# Patient Record
Sex: Male | Born: 1950 | Race: White | Hispanic: No | Smoking: Former smoker
Health system: Southern US, Community
[De-identification: ages and names within clinical notes are randomized; demographics above are authoritative.]

## PROBLEM LIST (undated history)

## (undated) DIAGNOSIS — J449 Chronic obstructive pulmonary disease, unspecified: Secondary | ICD-10-CM

## (undated) DIAGNOSIS — N400 Enlarged prostate without lower urinary tract symptoms: Secondary | ICD-10-CM

## (undated) DIAGNOSIS — K08109 Complete loss of teeth, unspecified cause, unspecified class: Secondary | ICD-10-CM

## (undated) DIAGNOSIS — I10 Essential (primary) hypertension: Secondary | ICD-10-CM

## (undated) DIAGNOSIS — R053 Chronic cough: Secondary | ICD-10-CM

## (undated) DIAGNOSIS — I2699 Other pulmonary embolism without acute cor pulmonale: Secondary | ICD-10-CM

## (undated) DIAGNOSIS — J189 Pneumonia, unspecified organism: Secondary | ICD-10-CM

## (undated) DIAGNOSIS — N189 Chronic kidney disease, unspecified: Secondary | ICD-10-CM

## (undated) DIAGNOSIS — J8289 Other pulmonary eosinophilia, not elsewhere classified: Secondary | ICD-10-CM

## (undated) DIAGNOSIS — N401 Enlarged prostate with lower urinary tract symptoms: Secondary | ICD-10-CM

## (undated) DIAGNOSIS — Z973 Presence of spectacles and contact lenses: Secondary | ICD-10-CM

## (undated) DIAGNOSIS — R0609 Other forms of dyspnea: Secondary | ICD-10-CM

## (undated) DIAGNOSIS — J439 Emphysema, unspecified: Secondary | ICD-10-CM

## (undated) DIAGNOSIS — C679 Malignant neoplasm of bladder, unspecified: Secondary | ICD-10-CM

## (undated) DIAGNOSIS — R3912 Poor urinary stream: Secondary | ICD-10-CM

## (undated) DIAGNOSIS — B182 Chronic viral hepatitis C: Secondary | ICD-10-CM

## (undated) DIAGNOSIS — M1990 Unspecified osteoarthritis, unspecified site: Secondary | ICD-10-CM

## (undated) DIAGNOSIS — R0600 Dyspnea, unspecified: Secondary | ICD-10-CM

## (undated) DIAGNOSIS — Z8616 Personal history of COVID-19: Secondary | ICD-10-CM

## (undated) DIAGNOSIS — G4733 Obstructive sleep apnea (adult) (pediatric): Secondary | ICD-10-CM

## (undated) DIAGNOSIS — K9049 Malabsorption due to intolerance, not elsewhere classified: Secondary | ICD-10-CM

## (undated) DIAGNOSIS — J4489 Other specified chronic obstructive pulmonary disease: Secondary | ICD-10-CM

## (undated) DIAGNOSIS — D649 Anemia, unspecified: Secondary | ICD-10-CM

## (undated) DIAGNOSIS — Z86711 Personal history of pulmonary embolism: Secondary | ICD-10-CM

## (undated) HISTORY — DX: Other pulmonary embolism without acute cor pulmonale: I26.99

## (undated) HISTORY — PX: ANAL FISTULECTOMY: SHX1139

## (undated) HISTORY — DX: Chronic viral hepatitis C: B18.2

## (undated) HISTORY — DX: Malignant neoplasm of bladder, unspecified: C67.9

## (undated) HISTORY — PX: CYSTOSCOPY: SUR368

## (undated) HISTORY — PX: ABDOMINAL HERNIA REPAIR: SHX539

## (undated) HISTORY — DX: Benign prostatic hyperplasia without lower urinary tract symptoms: N40.0

---

## 2008-01-22 HISTORY — PX: INGUINAL HERNIA REPAIR: SUR1180

## 2009-01-21 HISTORY — PX: UMBILICAL HERNIA REPAIR: SHX196

## 2014-01-21 HISTORY — PX: SHOULDER ARTHROSCOPY W/ ROTATOR CUFF REPAIR: SHX2400

## 2014-01-21 HISTORY — PX: ORIF WRIST FRACTURE: SHX2133

## 2015-01-22 DIAGNOSIS — Z8615 Personal history of latent tuberculosis infection: Secondary | ICD-10-CM

## 2015-01-22 HISTORY — DX: Personal history of latent tuberculosis infection: Z86.15

## 2016-01-22 DIAGNOSIS — Z8619 Personal history of other infectious and parasitic diseases: Secondary | ICD-10-CM

## 2016-01-22 HISTORY — DX: Personal history of other infectious and parasitic diseases: Z86.19

## 2018-01-21 DIAGNOSIS — Z8551 Personal history of malignant neoplasm of bladder: Secondary | ICD-10-CM

## 2018-01-21 DIAGNOSIS — Z95828 Presence of other vascular implants and grafts: Secondary | ICD-10-CM

## 2018-01-21 HISTORY — PX: IVC FILTER INSERTION: CATH118245

## 2018-01-21 HISTORY — PX: TRANSURETHRAL RESECTION OF BLADDER TUMOR: SHX2575

## 2018-01-21 HISTORY — DX: Personal history of malignant neoplasm of bladder: Z85.51

## 2018-01-21 HISTORY — DX: Presence of other vascular implants and grafts: Z95.828

## 2019-01-22 DIAGNOSIS — Z8619 Personal history of other infectious and parasitic diseases: Secondary | ICD-10-CM

## 2019-01-22 HISTORY — PX: BRONCHOSCOPY: SUR163

## 2019-01-22 HISTORY — DX: Personal history of other infectious and parasitic diseases: Z86.19

## 2019-01-22 HISTORY — PX: TRANSURETHRAL RESECTION OF PROSTATE: SHX73

## 2020-11-29 ENCOUNTER — Emergency Department (HOSPITAL_COMMUNITY)
Admission: EM | Admit: 2020-11-29 | Discharge: 2020-11-29 | Discharge status: Against Medical Advice | Payer: Medicare Other | Source: Home / Self Care

## 2020-12-07 DIAGNOSIS — I10 Essential (primary) hypertension: Secondary | ICD-10-CM | POA: Diagnosis not present

## 2020-12-07 DIAGNOSIS — J418 Mixed simple and mucopurulent chronic bronchitis: Secondary | ICD-10-CM | POA: Diagnosis not present

## 2020-12-07 DIAGNOSIS — Z0000 Encounter for general adult medical examination without abnormal findings: Secondary | ICD-10-CM | POA: Diagnosis not present

## 2020-12-07 DIAGNOSIS — M25531 Pain in right wrist: Secondary | ICD-10-CM | POA: Diagnosis not present

## 2020-12-07 DIAGNOSIS — Z23 Encounter for immunization: Secondary | ICD-10-CM | POA: Diagnosis not present

## 2020-12-07 DIAGNOSIS — D692 Other nonthrombocytopenic purpura: Secondary | ICD-10-CM | POA: Diagnosis not present

## 2020-12-07 DIAGNOSIS — Z1389 Encounter for screening for other disorder: Secondary | ICD-10-CM | POA: Diagnosis not present

## 2020-12-07 DIAGNOSIS — Z8551 Personal history of malignant neoplasm of bladder: Secondary | ICD-10-CM | POA: Diagnosis not present

## 2020-12-07 DIAGNOSIS — E7800 Pure hypercholesterolemia, unspecified: Secondary | ICD-10-CM | POA: Diagnosis not present

## 2020-12-22 ENCOUNTER — Other Ambulatory Visit: Payer: Self-pay

## 2020-12-22 ENCOUNTER — Ambulatory Visit (INDEPENDENT_AMBULATORY_CARE_PROVIDER_SITE_OTHER): Payer: Medicare Other | Admitting: Internal Medicine

## 2020-12-22 ENCOUNTER — Encounter: Payer: Self-pay | Admitting: Internal Medicine

## 2020-12-22 VITALS — BP 130/74 | HR 50 | Temp 97.7°F | Ht 67.0 in | Wt 166.4 lb

## 2020-12-22 DIAGNOSIS — Z8615 Personal history of latent tuberculosis infection: Secondary | ICD-10-CM | POA: Diagnosis not present

## 2020-12-22 DIAGNOSIS — A310 Pulmonary mycobacterial infection: Secondary | ICD-10-CM | POA: Diagnosis not present

## 2020-12-22 DIAGNOSIS — Z87891 Personal history of nicotine dependence: Secondary | ICD-10-CM | POA: Diagnosis not present

## 2020-12-22 DIAGNOSIS — J449 Chronic obstructive pulmonary disease, unspecified: Secondary | ICD-10-CM | POA: Diagnosis not present

## 2020-12-22 DIAGNOSIS — Z8551 Personal history of malignant neoplasm of bladder: Secondary | ICD-10-CM

## 2020-12-22 MED ORDER — SPIRIVA HANDIHALER 18 MCG IN CAPS: 1.0000 | ORAL_CAPSULE | Freq: Every day | RESPIRATORY_TRACT | 5 refills | Status: DC

## 2020-12-22 MED ORDER — ALBUTEROL SULFATE HFA 108 (90 BASE) MCG/ACT IN AERS: 2.0000 | INHALATION_SPRAY | Freq: Four times a day (QID) | RESPIRATORY_TRACT | 5 refills | Status: DC | PRN

## 2020-12-22 MED ORDER — ADVAIR HFA 230-21 MCG/ACT IN AERO: 2.0000 | INHALATION_SPRAY | Freq: Two times a day (BID) | RESPIRATORY_TRACT | 12 refills | Status: DC

## 2020-12-22 NOTE — Progress Notes (Signed)
James Barker    [id number]    [birth year]  Primary Care Physician:No primary care provider on file.  Referring Physician: Lois Huxley, [place],  [place] [address] Reason for Consultation: asthma and copd Date of Consultation: 12/22/2020  Chief complaint:   Chief Complaint  Patient presents with   Consult    Pt states he has had asthma all his life and also states he has COPD. Pt states that he coughs and wheezes almost all the time. States when he wakes up in the morning, his symptoms seem to be the worse but states that when he does have an asthma attack,they are worse at night.     HPI: James Barker is a 70 y.o. man here for new patient evaluation for asthma and COPD. Recently moved here from [place] via [place]. Has seen a pulmonologist about a year ago in [place].    Dr. Glennon Mac - pulmonary doctor in [place] ([place] ) Dr. Wilford Grist in [facility]  He notes exposure to tuberculosis and was treated for latent TB in 2017 and treated for 9 months.  Had pneumonia twice and was treated for MAC while living in [place]. Has had a bronchoscopy and prolonged hospital stay.   Asthma with recurrent bronchitis in childhood. Was limited in adolescence in doing distance and endurance activities.  Had pulmonary embolism in 2015 with recurrence in 2019. He has an IVC filter because he has been unable tolerate anticoagulation for more than a couple of weeks and they were discontinued due to hematuria but this was at the same time he was treated with bladder cancer and. No GI bleed. Never had a stroke or ICH.  Mother had Blood clots but unclear if there is a genetic/familial history.    Current Regimen: Spiriva, symbicort, prn albuterol  Asthma Triggers: poor air quality Exacerbations in the last year: frequent exacerbations requiring prednisone. Last needed in Spring 2022.  History of hospitalization or intubation: not intubated   Allergy Testing: allergic to grasses, trees, molds GERD: denies Allergic Rhinitis: takes OTC antihistamine when his breathing is bad.  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  Exposures: lives with ex wife and dog.  Smoking history: passive smoke exposure in childhood. 20 pack year smoking history quit in 1990  Social History   Occupational History   Not on file  Tobacco Use   Smoking status: Former    Packs/day: 1.00    Years: 20.00    Pack years: 20.00    Types: Cigarettes    Quit date: 1992    Years since quitting: 30.9   Smokeless tobacco: Never  Substance and Sexual Activity   Alcohol use: Not on file   Drug use: Not on file   Sexual activity: Not on file    Relevant family history:  Family History  Problem Relation Age of Onset   Pulmonary embolism Mother    Emphysema Father    Lung cancer Sister    Asthma Child    Asthma Child     Past Medical History:  Diagnosis Date   Bladder cancer ([facility])    BPH (benign prostatic hyperplasia)    Pulmonary embolism ([facility])     Past Surgical  History:  Procedure Laterality Date   ABDOMINAL HERNIA REPAIR     CYSTOSCOPY      Physical Exam: Blood pressure 130/74, pulse (!) 50, temperature 97.7 F (36.5 C), temperature source Oral, height _0  (1.702 m), weight 166 lb 6.4 oz (75.5 kg), SpO2 98 %. Gen:      No acute distress ENT:  no nasal polyps, mucus membranes moist Lungs:    No increased respiratory effort, symmetric chest wall excursion, clear to auscultation bilaterally, no wheezes or crackles CV:         Regular rate and rhythm; no murmurs, rubs, or gallops.  No pedal edema Abd:      + bowel sounds; soft, non-tender; no distension MSK: no acute synovitis of DIP or PIP joints, no mechanics hands.  Skin:      Warm and  dry; no rashes Neuro: normal speech, no focal facial asymmetry Psych: alert and oriented x3, normal mood and affect   Data Reviewed/Medical Decision Making:  Independent interpretation of tests: Imaging:  PFTs:  No flowsheet data found.  Labs:   Immunization status:  Immunization History  Administered Date(s) Administered   Influenza, High Dose Seasonal PF 12/08/2020   Moderna Sars-Covid-2 Vaccination 03/25/2019, 04/22/2019, 09/30/2019   Pneumococcal-Unspecified 12/08/2020     I reviewed prior external note(s) from [facility] PCP  I reviewed the result(s) of the labs and imaging as noted above.   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 S/P IVC filter due to hematuria Family history of PE   Plan/Recommendations: Mr. Nix has an extensive past medical history as well as extensive pulmonary history. At this point his asthma is suboptimally controlled with frequent exacerbations requiring prednisone but we need more information. Will obtain PFTs. He is producing sputum so will send AFB and sputum cultures today.   We need to obtain records from his previous pulmonologists in [place] and [place].   He has brought in a paper saying symbicort is no longer covered so we'll switch to advair HFA to maintain his ICS dosing. Continue spiriva. Continue prn albuterol.  Continue spiriva.  He is up to date on his flu shot.   We discussed disease management and progression at length today.   I spent 45 minutes in the care of this patient today including pre-charting, chart review, review of results, face-to-face care, coordination of care and communication with consultants etc.).   Return to Care: Return in about 6 weeks (around 02/02/2021).  Lenice Llamas, MD Pulmonary and [place]  CC: Lois Huxley, [name]

## 2020-12-22 NOTE — Patient Instructions (Addendum)
Please schedule follow up scheduled with myself in 6-8 weeks.  If my schedule is not open yet, we will contact you with a reminder closer to that time. Please call [phone] if you haven't heard from [place] a month before.   Before your next visit I would like you to have: Full set of PFTs - 1 hour, follow up with me after. Sputum cultures x2  We are stopping your symbicort and switching you to advair 2 puffs twice a day, gargle after use. You can finish what you have left.   We will obtain records from your pulmonogist in [place] and [place] and have you sign a release today.   Instructions for getting a sputum sample:  Collect as soon as you wake up in the morning Do not eat or drink anything before collecting sample Only one sample per day per cup. Collect each sample in its own separate cups Only use cup care provider provided for you Do not use own plastic home containers Wash your hands before opening collection cup Do not take off lip until you are ready to place sample in cup  How to collect sputum sample 1. Loosen sputum Take 3 slow deep breath, so you can feel it from your stomach If you have a vest you may use this device to help break up sputum  2. Collect sample Wash your hands Take lid off Press rim of cup against lower lip  Take deep breath and hope for 2-3 seconds, then cough deeply by using stomach muscles to force out sputum. Make sure if comes from your stomach not your  not your throat or chest. GOAL: collect 3-105mL of sputum try to avoid saliva or spit  3. Refrigerate sample Close container  Place date on label provided for you - should be in bag Put label on cup Put cup in bag provided to your from office Place in refrigerator right away. DO NOT FREEZE IT.  Bring your sample to the [address] location, unless instructed otherwise.   Reminders Patient should have 2 cups if collecting more than one order Obtain sample in the  morning Return to our office within 4 hours of producing sample Produce enough sputum to reach the line marked by clinical staff  Make sure it is sputum and not spit  Any questions call the nearest office [place]- [phone] [place]- [phone]

## 2020-12-25 ENCOUNTER — Other Ambulatory Visit: Payer: Medicare Other

## 2020-12-25 DIAGNOSIS — J449 Chronic obstructive pulmonary disease, unspecified: Secondary | ICD-10-CM

## 2020-12-25 DIAGNOSIS — A310 Pulmonary mycobacterial infection: Secondary | ICD-10-CM

## 2020-12-26 ENCOUNTER — Other Ambulatory Visit: Payer: Medicare Other

## 2020-12-26 DIAGNOSIS — A310 Pulmonary mycobacterial infection: Secondary | ICD-10-CM | POA: Diagnosis not present

## 2020-12-26 DIAGNOSIS — J449 Chronic obstructive pulmonary disease, unspecified: Secondary | ICD-10-CM

## 2020-12-29 ENCOUNTER — Telehealth: Payer: Self-pay | Admitting: Internal Medicine

## 2020-12-29 DIAGNOSIS — J470 Bronchiectasis with acute lower respiratory infection: Secondary | ICD-10-CM

## 2020-12-29 LAB — RESPIRATORY CULTURE OR RESPIRATORY AND SPUTUM CULTURE
MICRO NUMBER:: 12714174
SPECIMEN QUALITY:: ADEQUATE

## 2020-12-29 NOTE — Telephone Encounter (Signed)
Received call from Dr. Henreitta Leber office stating that the pt had called them and was very confused as to why he was being referred out. Asked if we could call pt to let him know why Dr.Comer is going to see him. appt is on 12/12. routing as urgentr to help clear confusion for pt.

## 2020-12-29 NOTE — Telephone Encounter (Signed)
Called and spoke with pt about why the referral was placed for him to see Dr. Linus Salmons with ID and he verbalized understanding. Nothing further needed.

## 2021-01-01 NOTE — Telephone Encounter (Signed)
I have got the patient's ct moved to 12/13 at 7:30 he is aware

## 2021-01-01 NOTE — Telephone Encounter (Signed)
Noted.  Will close encounter.  

## 2021-01-01 NOTE — Telephone Encounter (Signed)
Noted.   Will route message to [facility] team. Ladies per ND patient needs CT before appt on 12/13.

## 2021-01-01 NOTE — Telephone Encounter (Signed)
Patient states needs CT scan to be stat. Dr. Linus Salmons going out of town. Scheduled to see Dr. Linus Salmons 01/02/2021. Needs CT scan before seeing Dr. Linus Salmons. Patient phone number is [phone].

## 2021-01-01 NOTE — Telephone Encounter (Signed)
Pt is now scheduled for tomorrow at 7:15 am  and he is aware

## 2021-01-01 NOTE — Telephone Encounter (Signed)
Patient now requesting sooner CT prior to visiting Dr. Linus Salmons on 12/13. Patient advised that the current order would need to be changed to STAT and pre authorized by insurance. Patient will contact Pulmonary team and request this so he can get a sooner appointment for CT. For now patient will see Dr. Linus Salmons as scheduled tomorrow. [name]

## 2021-01-02 ENCOUNTER — Ambulatory Visit (INDEPENDENT_AMBULATORY_CARE_PROVIDER_SITE_OTHER): Payer: Medicare Other | Admitting: Internal Medicine

## 2021-01-02 ENCOUNTER — Ambulatory Visit (HOSPITAL_BASED_OUTPATIENT_CLINIC_OR_DEPARTMENT_OTHER)
Admission: RE | Admit: 2021-01-02 | Discharge: 2021-01-02 | Disposition: A | Discharge status: Home / Self Care | Payer: Medicare Other | Source: Ambulatory Visit | Attending: Internal Medicine | Admitting: Internal Medicine

## 2021-01-02 ENCOUNTER — Other Ambulatory Visit: Payer: Self-pay

## 2021-01-02 ENCOUNTER — Encounter: Payer: Self-pay | Admitting: Internal Medicine

## 2021-01-02 DIAGNOSIS — J439 Emphysema, unspecified: Secondary | ICD-10-CM | POA: Diagnosis not present

## 2021-01-02 DIAGNOSIS — J449 Chronic obstructive pulmonary disease, unspecified: Secondary | ICD-10-CM

## 2021-01-02 DIAGNOSIS — M351 Other overlap syndromes: Secondary | ICD-10-CM | POA: Insufficient documentation

## 2021-01-02 DIAGNOSIS — J470 Bronchiectasis with acute lower respiratory infection: Secondary | ICD-10-CM | POA: Insufficient documentation

## 2021-01-02 DIAGNOSIS — I700 Atherosclerosis of aorta: Secondary | ICD-10-CM | POA: Diagnosis not present

## 2021-01-02 DIAGNOSIS — J479 Bronchiectasis, uncomplicated: Secondary | ICD-10-CM | POA: Diagnosis not present

## 2021-01-03 ENCOUNTER — Encounter: Payer: Self-pay | Admitting: Internal Medicine

## 2021-01-03 NOTE — Progress Notes (Signed)
[facility] for Infectious Disease      Reason for Consult: history of MAI positive culture    Referring Physician: Dr. Shearon Stalls    Patient ID: Drexel Iha, male    DOB: [birth year], 70 y.o.   MRN: [id number]  HPI:   He is here for evaluation as a new patient with a history of pneumonia.   He has a known history of asthma and COPD by PFTs and has been followed by pulmonary and infectious disease in the past.  No records available and all history from the patient.  About 2 years ago, he was hospitalized with a lung opacity and concern for tuberculosis.  He was on isolation and ruled out reportedly and treated with antibiotics and after a prolonged hospital stay, was released off of antibiotics and really did not have any follow up after that.  He had a positive culture for MAI at that time but no treatment ever offered or considered.  He now has moved to the area and saw Dr. Shearon Stalls of pulmonary and referred here with that history.  He had a CT scan of his chest in the am of the visit and this was reviewed with him.  No report was available at the time of the visit.  He is having no fever, no night sweats, no change in sputum or change in his breathing.  No hemoptysis. He has a history of a PE and an IVC filter.    Past Medical History:  Diagnosis Date   Bladder cancer ([facility])    BPH (benign prostatic hyperplasia)    Chronic hepatitis C ([facility])    treated with anti-virals   Pulmonary embolism ([facility])     Prior to Admission medications   Medication Sig Start Date End Date Taking? Authorizing Provider  albuterol (VENTOLIN HFA) 108 (90 Base) MCG/ACT inhaler Inhale 2 puffs into the lungs every 6 (six) hours as needed for wheezing or shortness of breath. 12/22/20  Yes Spero Geralds, MD  amLODipine (NORVASC) 5 MG tablet Take 5 mg by mouth 2 (two) times daily. 11/06/20  Yes [provider]  atorvastatin (LIPITOR) 20 MG tablet Take 20 mg by mouth daily. 12/07/20  Yes [provider]  fluticasone-salmeterol (ADVAIR HFA) 230-21 MCG/ACT inhaler Inhale 2 puffs into the lungs 2 (two) times daily. 12/22/20  Yes Spero Geralds, MD  losartan (COZAAR) 50 MG tablet Take 50 mg by mouth daily. 12/12/20  Yes [provider]  SPIRIVA HANDIHALER 18 MCG inhalation capsule Place 1 capsule (18 mcg total) into inhaler and inhale daily. 12/22/20  Yes Spero Geralds, MD    No Known Allergies  Social History   Tobacco Use   Smoking status: Former    Packs/day: 1.00    Years: 20.00    Pack years: 20.00    Types: Cigarettes    Quit date: 2    Years since quitting: 30.9   Smokeless tobacco: Never    Family History  Problem Relation Age of Onset   Pulmonary embolism Mother    Emphysema Father    Lung cancer Sister    Asthma Child    Asthma Child     Review of Systems  Constitutional: negative for fevers and chills Respiratory: negative for cough, sputum, or hemoptysis Gastrointestinal: negative for nausea and diarrhea All other systems reviewed and are negative    Constitutional: in no apparent distress  Vitals:   01/02/21 1356  BP: (!) 145/85  Pulse: Marland Kitchen)  48  Temp: 97.6 F (36.4 C)  SpO2: 99%   EYES: anicteric ENMT: no thrush Cardiovascular: Cor RRR Respiratory: CTA B; normal respiratory effort Musculoskeletal: no pedal edema noted Skin: negatives: no rash Neuro: non-focal  Labs: No results found for: WBC, HGB, HCT, MCV, PLT No results found for: CREATININE, BUN, NA, K, CL, CO2 No results found for: ALT, AST, GGT, ALKPHOS, BILITOT, INR   Assessment: he has a history of a positive culture with MAI but never treated.  I reviewed the CT scan with him and there are no concerning findings to warrant any further evaluation from an ID standpoint.  No cavitary lesions, no bronchiectasis noted.  I also reviewed with the sputum culture with Acinetobacter and with no current symptoms of pneumonia or infection, no indication for treatment.     Plan: 1)  agree with PFTs, which are already ordered 2) continue follow up with pulmonary Follow up here as needed

## 2021-01-04 DIAGNOSIS — M1811 Unilateral primary osteoarthritis of first carpometacarpal joint, right hand: Secondary | ICD-10-CM | POA: Diagnosis not present

## 2021-01-05 ENCOUNTER — Ambulatory Visit (HOSPITAL_COMMUNITY): Payer: Medicare Other

## 2021-01-31 DIAGNOSIS — Z8551 Personal history of malignant neoplasm of bladder: Secondary | ICD-10-CM | POA: Diagnosis not present

## 2021-01-31 DIAGNOSIS — R3912 Poor urinary stream: Secondary | ICD-10-CM | POA: Diagnosis not present

## 2021-02-07 DIAGNOSIS — M1811 Unilateral primary osteoarthritis of first carpometacarpal joint, right hand: Secondary | ICD-10-CM | POA: Diagnosis not present

## 2021-02-11 LAB — AFB CULTURE WITH SMEAR (NOT AT ARMC)
Acid Fast Culture: NEGATIVE
Acid Fast Smear: NEGATIVE

## 2021-02-20 ENCOUNTER — Ambulatory Visit (INDEPENDENT_AMBULATORY_CARE_PROVIDER_SITE_OTHER): Payer: Medicare Other | Admitting: Internal Medicine

## 2021-02-20 ENCOUNTER — Other Ambulatory Visit: Payer: Self-pay

## 2021-02-20 ENCOUNTER — Encounter: Payer: Self-pay | Admitting: Internal Medicine

## 2021-02-20 VITALS — BP 128/74 | HR 48 | Temp 97.8°F | Ht 66.0 in | Wt 168.0 lb

## 2021-02-20 DIAGNOSIS — J449 Chronic obstructive pulmonary disease, unspecified: Secondary | ICD-10-CM

## 2021-02-20 LAB — PULMONARY FUNCTION TEST
DL/VA % pred: 67 %
DL/VA: 2.78 ml/min/mmHg/L
DLCO cor % pred: 64 %
DLCO cor: 14.74 ml/min/mmHg
DLCO unc % pred: 64 %
DLCO unc: 14.74 ml/min/mmHg
FEF 25-75 Post: 0.83 L/sec
FEF 25-75 Pre: 0.73 L/sec
FEF2575-%Change-Post: 14 %
FEF2575-%Pred-Post: 39 %
FEF2575-%Pred-Pre: 34 %
FEV1-%Change-Post: 8 %
FEV1-%Pred-Post: 61 %
FEV1-%Pred-Pre: 56 %
FEV1-Post: 1.7 L
FEV1-Pre: 1.57 L
FEV1FVC-%Change-Post: 4 %
FEV1FVC-%Pred-Pre: 66 %
FEV6-%Change-Post: 2 %
FEV6-%Pred-Post: 89 %
FEV6-%Pred-Pre: 86 %
FEV6-Post: 3.16 L
FEV6-Pre: 3.08 L
FEV6FVC-%Change-Post: 0 %
FEV6FVC-%Pred-Post: 102 %
FEV6FVC-%Pred-Pre: 102 %
FVC-%Change-Post: 3 %
FVC-%Pred-Post: 87 %
FVC-%Pred-Pre: 84 %
FVC-Post: 3.3 L
FVC-Pre: 3.2 L
Post FEV1/FVC ratio: 51 %
Post FEV6/FVC ratio: 96 %
Pre FEV1/FVC ratio: 49 %
Pre FEV6/FVC Ratio: 96 %
RV % pred: 131 %
RV: 2.94 L
TLC % pred: 105 %
TLC: 6.54 L

## 2021-02-20 MED ORDER — PREDNISONE 10 MG PO TABS: ORAL_TABLET | ORAL | 0 refills | Status: AC

## 2021-02-20 MED ORDER — MISC. DEVICES MISC: 0 refills | Status: DC

## 2021-02-20 NOTE — Progress Notes (Signed)
James Barker    [id number]    01-02-1951  Primary Care Physician:[name], [name], PA Date of Appointment: 02/20/2021 Established Patient Visit  Chief complaint:   Chief Complaint  Patient presents with   Follow-up    Cough and wheezing all the time      HPI: James Barker is a 71 y.o. man with history of asthma COPD overlap syndrome.  Interval Updates: Here for follow up after CT scan and PFTs.   He has been taking albuterol more frequently 2-3 times/day. With nebulizer treatments.   He has been having cough with mucus production. Worst in the morning.   No fevers, chills, night sweats, weight loss. Appetite is ok  Still on spiriva and advair almost everyday.   I have reviewed the patient's family social and past medical history and updated as appropriate.   Past Medical History:  Diagnosis Date   Bladder cancer ( [facility])    BPH (benign prostatic hyperplasia)    Chronic hepatitis C ([facility])    treated with anti-virals   Pulmonary embolism ([facility])     Past Surgical History:  Procedure Laterality Date   ABDOMINAL HERNIA REPAIR     CYSTOSCOPY      Family History  Problem Relation Age of Onset   Pulmonary embolism Mother    Emphysema Father    Lung cancer Sister    Asthma Child    Asthma Child     Social History   Occupational History   Not on file  Tobacco Use   Smoking status: Former    Packs/day: 1.00    Years: 20.00    Pack years: 20.00    Types: Cigarettes    Quit date: 1992    Years since quitting: 31.1   Smokeless tobacco: Never  Substance and Sexual Activity   Alcohol use: Not on file   Drug use: Not on file   Sexual activity: Not on file     Physical Exam: Blood pressure 128/74, pulse (!) 48, temperature 97.8 F (36.6 C), temperature source Oral, height 5\' 6"  (1.676 m), weight 168 lb (76.2 kg), SpO2 99 %.  Gen:      No acute distress ENT:  no nasal polyps, mucus membranes moist Lungs:    No increased respiratory effort,  symmetric chest wall excursion, clear to auscultation bilaterally, no wheezes or crackles CV:         Regular rate and rhythm; no murmurs, rubs, or gallops.  No pedal edema   Data Reviewed: Imaging: I have personally reviewed the CT Chest which shows mild upper lobe predominant emphysema. Mild airway thickening.   PFTs:  PFT Results Latest Ref Rng & Units 02/20/2021  FVC-Pre L 3.20  FVC-Predicted Pre % 84  FVC-Post L 3.30  FVC-Predicted Post % 87  Pre FEV1/FVC % % 49  Post FEV1/FCV % % 51  FEV1-Pre L 1.57  FEV1-Predicted Pre % 56  FEV1-Post L 1.70  DLCO uncorrected ml/min/mmHg 14.74  DLCO UNC% % 64  DLCO corrected ml/min/mmHg 14.74  DLCO COR %Predicted % 64  DLVA Predicted % 67  TLC L 6.54  TLC % Predicted % 105  RV % Predicted % 131   I have personally reviewed the patient's PFTs and moderately severe airflow limitation. Reduced diffusion capacity  Labs:  Immunization status: Immunization History  Administered Date(s) Administered   Influenza, High Dose Seasonal PF 12/08/2020   Moderna Sars-Covid-2 Vaccination 03/25/2019, 04/22/2019, 09/30/2019   Pneumococcal-Unspecified 12/08/2020  External Records Personally Reviewed: extensive records reviewed from hospital stay in [place], [place]  Assessment:  Asthma COPD Overlap Syndrome with acute exacerbation Radiographic emphysema Acinetobacter colonization  Plan/Recommendations: Prednisone taper today for flare.  Will obtain IgE, Region 2 immunocap testing, cbc with diff. May be eligible for biologics.  Continue advair and spiriva Will prescribe flutter valve   Return to Care: Return in about 3 months (around 05/20/2021).   Lenice Llamas, MD Pulmonary and [place]

## 2021-02-20 NOTE — Patient Instructions (Addendum)
Please schedule follow up scheduled with myself in 3 months.  If my schedule is not open yet, we will contact you with a reminder closer to that time. Please call [phone] if you haven't heard from [place] a month before.   I have sent a prednisone taper and a flutter valve to your pharmacy.   Continue advair and spiriva.   Please get your blood work done

## 2021-02-20 NOTE — Progress Notes (Signed)
PFT done today. 

## 2021-02-22 DIAGNOSIS — R160 Hepatomegaly, not elsewhere classified: Secondary | ICD-10-CM | POA: Diagnosis not present

## 2021-02-22 DIAGNOSIS — K5900 Constipation, unspecified: Secondary | ICD-10-CM | POA: Diagnosis not present

## 2021-02-22 DIAGNOSIS — Z8551 Personal history of malignant neoplasm of bladder: Secondary | ICD-10-CM | POA: Diagnosis not present

## 2021-02-22 DIAGNOSIS — N281 Cyst of kidney, acquired: Secondary | ICD-10-CM | POA: Diagnosis not present

## 2021-02-26 ENCOUNTER — Telehealth: Payer: Self-pay | Admitting: Internal Medicine

## 2021-02-26 DIAGNOSIS — J449 Chronic obstructive pulmonary disease, unspecified: Secondary | ICD-10-CM

## 2021-02-26 NOTE — Telephone Encounter (Signed)
Spoke with the pt  He states that he was unable to pick up rx for flutter valve from [facility]

## 2021-02-26 NOTE — Telephone Encounter (Signed)
[facility]ed [facility]  They do not have flutter valves  Offered pt to come to office and pick one up since they are back in stock  Pt will pick up and it has been placed up front with form to be signed

## 2021-04-02 DIAGNOSIS — Z8551 Personal history of malignant neoplasm of bladder: Secondary | ICD-10-CM | POA: Diagnosis not present

## 2021-04-04 ENCOUNTER — Telehealth: Payer: Self-pay | Admitting: Internal Medicine

## 2021-04-04 NOTE — Telephone Encounter (Signed)
Called patient and he states he needs a refill of the Iputropium- Albuterol solution for her neb machine. I looked and I didn't see this medication on his list but patient states he has been on this medication for years. ? ?Dr Shearon Stalls do you want to order this medication for this patient ? ?Please advise  ?

## 2021-04-04 NOTE — Telephone Encounter (Signed)
OK to order duoneb every 6 hours as needed for shortness of breath, but please let him know he can not take spiriva inhaler with duoneb or he will end up taking too much of the same medicine and this can cause heart problems. If he wants to take duoneb he should stop spiriva.  ? ?Also please remind him to get the blood work done that I ordered at the last visit because this will help qualify him for other therapies for his asthma that can dramatically improve symptoms.  ? ?

## 2021-04-05 MED ORDER — IPRATROPIUM-ALBUTEROL 0.5-2.5 (3) MG/3ML IN SOLN: 3.0000 mL | Freq: Four times a day (QID) | RESPIRATORY_TRACT | 6 refills | Status: DC | PRN

## 2021-04-05 NOTE — Telephone Encounter (Signed)
Spoke to patient and relayed below message. He voiced his understanding.  ?He would like to proceed with Duoneb. Rx sent to preferred pharmacy. ?He is aware to get labs and stop spiriva.  ?Nothing further needed.  ? ?

## 2021-04-09 ENCOUNTER — Other Ambulatory Visit (INDEPENDENT_AMBULATORY_CARE_PROVIDER_SITE_OTHER): Payer: Medicare Other

## 2021-04-09 DIAGNOSIS — J449 Chronic obstructive pulmonary disease, unspecified: Secondary | ICD-10-CM | POA: Diagnosis not present

## 2021-04-09 LAB — CBC WITH DIFFERENTIAL/PLATELET
Basophils Absolute: 0 10*3/uL (ref 0.0–0.1)
Basophils Relative: 0.6 % (ref 0.0–3.0)
Eosinophils Absolute: 0.3 10*3/uL (ref 0.0–0.7)
Eosinophils Relative: 4.2 % (ref 0.0–5.0)
HCT: 39.4 % (ref 39.0–52.0)
Hemoglobin: 13.3 g/dL (ref 13.0–17.0)
Lymphocytes Relative: 24.9 % (ref 12.0–46.0)
Lymphs Abs: 1.7 10*3/uL (ref 0.7–4.0)
MCHC: 33.7 g/dL (ref 30.0–36.0)
MCV: 87.6 fl (ref 78.0–100.0)
Monocytes Absolute: 0.7 10*3/uL (ref 0.1–1.0)
Monocytes Relative: 10.7 % (ref 3.0–12.0)
Neutro Abs: 4 10*3/uL (ref 1.4–7.7)
Neutrophils Relative %: 59.6 % (ref 43.0–77.0)
Platelets: 178 10*3/uL (ref 150.0–400.0)
RBC: 4.5 Mil/uL (ref 4.22–5.81)
RDW: 13.8 % (ref 11.5–15.5)
WBC: 6.7 10*3/uL (ref 4.0–10.5)

## 2021-04-11 LAB — RESPIRATORY ALLERGY PROFILE REGION II ~~LOC~~
Allergen, A. alternata, m6: <0.1 kU/L
Allergen, Cedar tree, t12: <0.1 kU/L
Allergen, Comm Silver Birch, t9: <0.1 kU/L
Allergen, Cottonwood, t14: <0.1 kU/L
Allergen, D pternoyssinus,d7: <0.1 kU/L
Allergen, Mouse Urine Protein, e78: <0.1 kU/L
Allergen, Mulberry, t76: <0.1 kU/L
Allergen, Oak,t7: <0.1 kU/L
Allergen, P. notatum, m1: <0.1 kU/L
Aspergillus fumigatus, m3: 1.61 kU/L — ABNORMAL HIGH
Bermuda Grass: <0.1 kU/L
Box Elder IgE: <0.1 kU/L
CLADOSPORIUM HERBARUM (M2) IGE: <0.1 kU/L
COMMON RAGWEED (SHORT) (W1) IGE: <0.1 kU/L
Cat Dander: <0.1 kU/L
Class: 0
Class: 0
Class: 0
Class: 0
Class: 0
Class: 0
Class: 0
Class: 0
Class: 0
Class: 0
Class: 0
Class: 0
Class: 0
Class: 0
Class: 0
Class: 0
Class: 0
Class: 0
Class: 0
Class: 0
Class: 0
Class: 0
Class: 0
Class: 2
Cockroach: <0.1 kU/L
D. farinae: <0.1 kU/L
Dog Dander: <0.1 kU/L
Elm IgE: <0.1 kU/L
IgE (Immunoglobulin E), Serum: 58 kU/L (ref <=114)
Johnson Grass: <0.1 kU/L
Pecan/Hickory Tree IgE: <0.1 kU/L
Rough Pigweed  IgE: <0.1 kU/L
Sheep Sorrel IgE: <0.1 kU/L
Timothy Grass: <0.1 kU/L

## 2021-04-11 LAB — INTERPRETATION:

## 2021-04-13 ENCOUNTER — Other Ambulatory Visit (HOSPITAL_COMMUNITY): Payer: Self-pay

## 2021-04-13 ENCOUNTER — Ambulatory Visit (INDEPENDENT_AMBULATORY_CARE_PROVIDER_SITE_OTHER): Payer: Medicare Other | Admitting: Nurse Practitioner

## 2021-04-13 ENCOUNTER — Telehealth: Payer: Self-pay | Admitting: Pharmacist

## 2021-04-13 ENCOUNTER — Other Ambulatory Visit: Payer: Self-pay

## 2021-04-13 ENCOUNTER — Encounter: Payer: Self-pay | Admitting: Nurse Practitioner

## 2021-04-13 VITALS — BP 140/90 | HR 59 | Temp 98.1°F | Ht 67.0 in | Wt 168.2 lb

## 2021-04-13 DIAGNOSIS — D721 Eosinophilia, unspecified: Secondary | ICD-10-CM

## 2021-04-13 DIAGNOSIS — J441 Chronic obstructive pulmonary disease with (acute) exacerbation: Secondary | ICD-10-CM | POA: Diagnosis not present

## 2021-04-13 DIAGNOSIS — B370 Candidal stomatitis: Secondary | ICD-10-CM

## 2021-04-13 DIAGNOSIS — J449 Chronic obstructive pulmonary disease, unspecified: Secondary | ICD-10-CM

## 2021-04-13 DIAGNOSIS — J45901 Unspecified asthma with (acute) exacerbation: Secondary | ICD-10-CM

## 2021-04-13 DIAGNOSIS — I10 Essential (primary) hypertension: Secondary | ICD-10-CM | POA: Diagnosis not present

## 2021-04-13 LAB — POCT EXHALED NITRIC OXIDE: FeNO level (ppb): 53

## 2021-04-13 MED ORDER — NYSTATIN 100000 UNIT/ML MT SUSP: 5.0000 mL | Freq: Four times a day (QID) | OROMUCOSAL | 0 refills | Status: AC

## 2021-04-13 MED ORDER — DUPIXENT 300 MG/2ML ~~LOC~~ SOAJ
SUBCUTANEOUS | 0 refills | Status: DC
  Filled 2021-04-13: qty 8, fill #0
  Filled 2021-04-13: qty 8, 28d supply, fill #0

## 2021-04-13 MED ORDER — ALBUTEROL SULFATE (2.5 MG/3ML) 0.083% IN NEBU: 2.5000 mg | INHALATION_SOLUTION | Freq: Four times a day (QID) | RESPIRATORY_TRACT | 12 refills | Status: DC | PRN

## 2021-04-13 MED ORDER — PREDNISONE 10 MG PO TABS: ORAL_TABLET | ORAL | 0 refills | Status: DC

## 2021-04-13 MED ORDER — GUAIFENESIN ER 600 MG PO TB12: 600.0000 mg | ORAL_TABLET | Freq: Two times a day (BID) | ORAL | 1 refills | Status: DC

## 2021-04-13 MED ORDER — SPIRIVA RESPIMAT 2.5 MCG/ACT IN AERS: 2.0000 | INHALATION_SPRAY | Freq: Every day | RESPIRATORY_TRACT | 5 refills | Status: DC

## 2021-04-13 NOTE — Patient Instructions (Addendum)
Continue Advair 2 puffs Twice daily. Brush tongue and rinse mouth afterwards ?Continue Albuterol inhaler 2 puffs every 6 hours as needed for shortness of breath or wheezing. Notify if symptoms persist despite rescue inhaler/neb use. ?Stop Duonebs and change to albuterol nebs 3 mL every 6 hours as needed for shortness of breath or wheezing ?Restart Spiriva 1 puff daily  ? ?-Prednisone taper. 4 tabs for 2 days, then 3 tabs for 2 days, 2 tabs for 2 days, then 1 tab for 2 days, then stop. Take in AM with food.  ?-Mucinex 600 mg Twice daily  ?-Nystatin 5 mL four times a day for 10 days. Swish and swallow  ? ?We will start the process for Dupixent. The pharmacy team will be in contact with you to discuss next steps.  ? ?Follow up with Dr. Shearon Stalls in 3 months. If symptoms do not improve or worsen, please contact office for sooner follow up or seek emergency care. ?

## 2021-04-13 NOTE — Telephone Encounter (Signed)
Delivery instructions have been updated in [facility], medication will be couriered to [facility] prior to 04/19/21. ? ?Rx has been processed in [facility] and the patient has no copay at this time. ? ?

## 2021-04-13 NOTE — Assessment & Plan Note (Signed)
Elevated on recent check to 0.3. Given this and symptom burden, will start process for biologic therapy with Dupixent.  ?

## 2021-04-13 NOTE — Telephone Encounter (Signed)
Submitted a Prior Authorization request to [facility] for [facility] via CoverMyMeds. Will update once we receive a response. ? ?Key: [id number] ? ?Dupixent [employer] paperwork completed at [facility] today per LPN message. ? ?Knox Saliva, PharmD, MPH, BCPS ?Clinical Pharmacist (Rheumatology and Pulmonology) ? ?

## 2021-04-13 NOTE — Progress Notes (Signed)
? ?'@Patient'$  ID: James Barker, male    DOB: 11/19/1950, 71 y.o.   MRN: [id number] ? ?Chief Complaint  ?Patient presents with  ? Follow-up  ?  Nebulizer medication question  ? ? ?Referring provider: ?Lois Huxley, PA ? ?HPI: ?71 year old male, former smoker (20 pack years) followed for asthma/COPD overlap. He is a patient of Dr. Mauricio Po and last seen in office on 02/20/2021. 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 ? ?TEST/EVENTS:  ?12/26/2020: AFB neg; sputum culture grew acinetobacter  ?01/02/2021 CT chest wo contrast: atherosclerosis. Azygous fissure and small Bochdalek's hernias containing only fat were noted. Mild diffuse central airway thickening. Linear scarring or atelectasis in RUL. Mild biapical scarring and mild centrilobular emphysema.  ?02/20/2021 PFTs: FVC 2.2 (87), FEV1 1.7 (61), ratio 51, TLC 105%, DLCOcor 64%. No BD; did have some midflow reversibility. Moderate obstructive airway disease with diffusion defect  ?04/09/2021: RAST positive high aspergillus, IgE 58, Eos 0.3  ? ?02/20/2021: OV with Dr. Shearon Stalls for AECOPD/asthma. Previously seen by ID for hx of MAI infection, never treated. CT chest without concerning findings. Sputum culture with acinetobacter - suspected to be colonization as no symptoms of pna or infection. Advised follow up with ID as needed. Prednisone taper prescribed. Allergen panel, CBC with diff ordered. Maintained on Advair and Spiriva. May be biologic candidate. Added flutter valve on ? ?04/13/2021: Today - follow up ?Patient presents today for follow up. He feels like yesterday, he had a terrible day and couldn't even walk up 4 steps without getting short of breath and wheezing. Today, he feels a little bit better. Still has some increased SOB and wheezing but nothing compared to yesterday. He has a chronic, minimally productive cough which is unchanged from his baseline. He has not  had any recent sick exposures and denies fevers, chills or body aches. He is a little confused about duonebs versus Spiriva; he also thought he was supposed to stop his Advair as well. Since he called into the office, he has tried to not use his nebs as much due to the confusion. He is tired of not being able to breathe well and wants to do more for his symptoms.  ? ?FeNO 53 ppb ? ?No Known Allergies ? ?Immunization History  ?Administered Date(s) Administered  ? Influenza, High Dose Seasonal PF 12/08/2020  ? Moderna Sars-Covid-2 Vaccination 03/25/2019, 04/22/2019, 09/30/2019  ? Pneumococcal-Unspecified 12/08/2020  ? ? ?Past Medical History:  ?Diagnosis Date  ? Bladder cancer ([place])   ? BPH (benign prostatic hyperplasia)   ? Chronic hepatitis C ([place])   ? treated with anti-virals  ? Pulmonary embolism ([place])   ? ? ?Tobacco History: ?Social History  ? ?Tobacco Use  ?Smoking Status Former  ? Packs/day: 1.00  ? Years: 20.00  ? Pack years: 20.00  ? Types: Cigarettes  ? Quit date: 33  ? Years since quitting: 31.2  ?Smokeless Tobacco Never  ? ?Counseling given: Not Answered ? ? ?Outpatient Medications Prior to Visit  ?Medication Sig Dispense Refill  ? albuterol (VENTOLIN HFA) 108 (90 Base) MCG/ACT inhaler Inhale 2 puffs into the lungs every 6 (six) hours as needed for wheezing or shortness of breath. 1 each 5  ? amLODipine (NORVASC) 5 MG tablet Take 5 mg by mouth 2 (two) times daily.    ? atorvastatin (LIPITOR) 20 MG tablet Take 20 mg by mouth daily.    ? fluticasone-salmeterol (  ADVAIR HFA) 230-21 MCG/ACT inhaler Inhale 2 puffs into the lungs 2 (two) times daily. 1 each 12  ? losartan (COZAAR) 50 MG tablet Take 50 mg by mouth daily.    ? Misc. Devices MISC Please dispense 1 flutter valve for use after inhalers 1 each 0  ? ipratropium-albuterol (DUONEB) 0.5-2.5 (3) MG/3ML SOLN Take 3 mLs by nebulization every 6 (six) hours as needed. 360 mL 6  ? ?No facility-administered medications prior to visit.  ? ? ? ?Review of Systems:   ? ?Constitutional: No weight loss or gain, night sweats, fevers, chills, fatigue, or lassitude. ?HEENT: No headaches, difficulty swallowing, tooth/dental problems, or sore throat. No sneezing, itching, ear ache, nasal congestion, or post nasal drip ?CV:  No chest pain, orthopnea, PND, swelling in lower extremities, anasarca, dizziness, palpitations, syncope ?Resp: +shortness of breath with exertion; wheezing; chronic productive cough. No excess mucus or change in color of mucus. No hemoptysis. No chest wall deformity ?Skin: No rash, lesions, ulcerations ?MSK:  No joint pain or swelling.  No decreased range of motion.  No back pain. ?Neuro: No dizziness or lightheadedness.  ?Psych: No depression or anxiety. Mood stable.  ? ? ? ?Physical Exam: ? ?BP 140/90 (BP Location: Right Arm, Cuff Size: Normal)   Pulse (!) 59   Temp 98.1 ?F (36.7 ?C)   Ht '5\' 7"'$  (1.702 m)   Wt 168 lb 3.2 oz (76.3 kg)   SpO2 97%   BMI 26.34 kg/m?  ? ?GEN: Pleasant, interactive, well-appearing; in no acute distress. ?HEENT:  Normocephalic and atraumatic. PERRLA. Sclera white. Nasal turbinates pink, moist and patent bilaterally. No rhinorrhea present. Oropharynx pink and moist, without exudate or edema. No lesions, ulcerations, or postnasal drip. Scattered white plaques on tongue ?NECK:  Supple w/ fair ROM. No JVD present. Normal carotid impulses w/o bruits. Thyroid symmetrical with no goiter or nodules palpated. No lymphadenopathy.   ?CV: RRR, no m/r/g, no peripheral edema. Pulses intact, +2 bilaterally. No cyanosis, pallor or clubbing. ?PULMONARY:  Unlabored, regular breathing. Expiratory wheezes bilaterally A&P. No accessory muscle use. No dullness to percussion. ?GI: BS present and normoactive. Soft, non-tender to palpation.  ?Neuro: A/Ox3. No focal deficits noted.   ?Skin: Warm, no lesions or rashe ?Psych: Normal affect and behavior. Judgement and thought content appropriate.  ? ? ? ?Lab Results: ? ?CBC ?   ?Component Value Date/Time  ?  WBC 6.7 04/09/2021 1051  ? RBC 4.50 04/09/2021 1051  ? HGB 13.3 04/09/2021 1051  ? HCT 39.4 04/09/2021 1051  ? PLT 178.0 04/09/2021 1051  ? MCV 87.6 04/09/2021 1051  ? MCHC 33.7 04/09/2021 1051  ? RDW 13.8 04/09/2021 1051  ? LYMPHSABS 1.7 04/09/2021 1051  ? MONOABS 0.7 04/09/2021 1051  ? EOSABS 0.3 04/09/2021 1051  ? BASOSABS 0.0 04/09/2021 1051  ? ? ?BMET ?No results found for: NA, K, CL, CO2, GLUCOSE, BUN, CREATININE, CALCIUM, GFRNONAA, GFRAA ? ?BNP ?No results found for: BNP ? ? ?Imaging: ? ?No results found. ? ? ? ? ?  Latest Ref Rng & Units 02/20/2021  ?  2:58 PM  ?PFT Results  ?FVC-Pre L 3.20    ?FVC-Predicted Pre % 84    ?FVC-Post L 3.30    ?FVC-Predicted Post % 87    ?Pre FEV1/FVC % % 49    ?Post FEV1/FCV % % 51    ?FEV1-Pre L 1.57    ?FEV1-Predicted Pre % 56    ?FEV1-Post L 1.70    ?DLCO uncorrected ml/min/mmHg 14.74    ?  DLCO UNC% % 64    ?DLCO corrected ml/min/mmHg 14.74    ?DLCO COR %Predicted % 64    ?DLVA Predicted % 67    ?TLC L 6.54    ?TLC % Predicted % 105    ?RV % Predicted % 131    ? ? ?No results found for: NITRICOXIDE ? ? ? ? ? ?Assessment & Plan:  ? ?Asthma with COPD with exacerbation ([place]) ?High symptom burden with acute flare in symptoms. Eosinophilia on recent labs 0.3 and elevated FeNO today. Prednisone taper prescribed. Will start process for biologic therapy with Dupixent. Educated on duoneb vs Spiriva - will move forward with Advair and Spiriva and change duonebs to albuterol nebs PRN. No new O2 demands and overall stable in clinic. Continue mucociliary clearance therapies for chronic cough.  ? ?Patient Instructions  ?Continue Advair 2 puffs Twice daily. Brush tongue and rinse mouth afterwards ?Continue Albuterol inhaler 2 puffs every 6 hours as needed for shortness of breath or wheezing. Notify if symptoms persist despite rescue inhaler/neb use. ?Stop Duonebs and change to albuterol nebs 3 mL every 6 hours as needed for shortness of breath or wheezing ?Restart Spiriva 1 puff daily   ? ?-Prednisone taper. 4 tabs for 2 days, then 3 tabs for 2 days, 2 tabs for 2 days, then 1 tab for 2 days, then stop. Take in AM with food.  ?-Mucinex 600 mg Twice daily  ?-Nystatin 5 mL four times a day for 10

## 2021-04-13 NOTE — Assessment & Plan Note (Signed)
BP 140/90 at visit. Currently on Losartan. Monitor BP at home and notify PCP if consistently >140/90. Follow up with PCP as scheduled.  ?

## 2021-04-13 NOTE — Assessment & Plan Note (Signed)
High symptom burden with acute flare in symptoms. Eosinophilia on recent labs 0.3 and elevated FeNO today. Prednisone taper prescribed. Will start process for biologic therapy with Dupixent. Educated on duoneb vs Spiriva - will move forward with Advair and Spiriva and change duonebs to albuterol nebs PRN. No new O2 demands and overall stable in clinic. Continue mucociliary clearance therapies for chronic cough.  ? ?Patient Instructions  ?Continue Advair 2 puffs Twice daily. Brush tongue and rinse mouth afterwards ?Continue Albuterol inhaler 2 puffs every 6 hours as needed for shortness of breath or wheezing. Notify if symptoms persist despite rescue inhaler/neb use. ?Stop Duonebs and change to albuterol nebs 3 mL every 6 hours as needed for shortness of breath or wheezing ?Restart Spiriva 1 puff daily  ? ?-Prednisone taper. 4 tabs for 2 days, then 3 tabs for 2 days, 2 tabs for 2 days, then 1 tab for 2 days, then stop. Take in AM with food.  ?-Mucinex 600 mg Twice daily  ?-Nystatin 5 mL four times a day for 10 days. Swish and swallow  ? ?We will start the process for Dupixent. The pharmacy team will be in contact with you to discuss next steps.  ? ?Follow up with Dr. Shearon Stalls in 3 months. If symptoms do not improve or worsen, please contact office for sooner follow up or seek emergency care. ? ? ?

## 2021-04-13 NOTE — Telephone Encounter (Signed)
Received notification from [facility] regarding a prior authorization for [place]. Authorization has been APPROVED from 04/13/21 to 10/14/21.  ? ?Per test claim, patient has NO copay for medication. ? ?Patient can fill through [facility]: [phone]  ? ?Authorization # [id number] ? ?Prescription for Dupixent sent to [facility] to be couriered to clinic by 04/19/21 ? ?Knox Saliva, PharmD, MPH, BCPS ?Clinical Pharmacist (Rheumatology and Pulmonology) ? ?

## 2021-04-16 ENCOUNTER — Other Ambulatory Visit (HOSPITAL_COMMUNITY): Payer: Self-pay

## 2021-04-16 NOTE — Telephone Encounter (Signed)
ATC patient to schedule Dupixent new start appt. Unable to reach. Left VM requesting return call ? ?Knox Saliva, PharmD, MPH, BCPS ?Clinical Pharmacist (Rheumatology and Pulmonology) ?

## 2021-04-18 ENCOUNTER — Other Ambulatory Visit: Payer: Self-pay | Admitting: Urology

## 2021-04-18 NOTE — Telephone Encounter (Signed)
ATC patient to schedule Dupixent new start. Unable to reach - left VM requesting return call to schedule Dupixent appt. ? ?Knox Saliva, PharmD, MPH, BCPS ?Clinical Pharmacist (Rheumatology and Pulmonology) ?

## 2021-04-20 ENCOUNTER — Telehealth: Payer: Self-pay | Admitting: Internal Medicine

## 2021-04-20 NOTE — Telephone Encounter (Signed)
Called and spoke with pt who states he started taking the nystatin for the thrush 3/25.  States he noticed a blood blister in his mouth today 3/31 and is wanting to know if it could be a reaction to the nystatin medication. ? ?Dr. Silas Flood, please advise on this for pt. Please route this back to triage pool only. ?

## 2021-04-20 NOTE — Telephone Encounter (Signed)
I do not think the blood blister is related to Nystatin

## 2021-04-20 NOTE — Telephone Encounter (Signed)
Called and spoke with pt letting him know the info per MH and he verbalized understanding. Nothing further needed. 

## 2021-04-23 ENCOUNTER — Telehealth: Payer: Self-pay | Admitting: Nurse Practitioner

## 2021-04-24 ENCOUNTER — Other Ambulatory Visit (HOSPITAL_COMMUNITY): Payer: Self-pay

## 2021-04-24 NOTE — Telephone Encounter (Signed)
Patient scheduled for Dupixent new start on 04/26/21. ? ?Knox Saliva, PharmD, MPH, BCPS ?Clinical Pharmacist (Rheumatology and Pulmonology) ?

## 2021-04-26 ENCOUNTER — Other Ambulatory Visit (HOSPITAL_COMMUNITY): Payer: Self-pay

## 2021-04-26 ENCOUNTER — Ambulatory Visit (INDEPENDENT_AMBULATORY_CARE_PROVIDER_SITE_OTHER): Payer: Medicare Other | Admitting: Pharmacist

## 2021-04-26 DIAGNOSIS — D721 Eosinophilia, unspecified: Secondary | ICD-10-CM

## 2021-04-26 DIAGNOSIS — Z7189 Other specified counseling: Secondary | ICD-10-CM

## 2021-04-26 DIAGNOSIS — J449 Chronic obstructive pulmonary disease, unspecified: Secondary | ICD-10-CM

## 2021-04-26 MED ORDER — DUPIXENT 300 MG/2ML ~~LOC~~ SOAJ
300.0000 mg | SUBCUTANEOUS | 1 refills | Status: DC
  Filled 2021-04-26: qty 12, 84d supply, fill #0
  Filled 2021-05-23: qty 4, 28d supply, fill #0
  Filled 2021-06-13: qty 4, 28d supply, fill #1
  Filled 2021-07-11: qty 4, 28d supply, fill #2
  Filled 2021-08-14: qty 4, 28d supply, fill #3
  Filled 2021-09-18: qty 4, 28d supply, fill #4
  Filled 2021-10-29: qty 4, 28d supply, fill #5

## 2021-04-26 MED ORDER — ALBUTEROL SULFATE (2.5 MG/3ML) 0.083% IN NEBU: 2.5000 mg | INHALATION_SOLUTION | Freq: Four times a day (QID) | RESPIRATORY_TRACT | 3 refills | Status: DC | PRN

## 2021-04-26 NOTE — Progress Notes (Signed)
? ?HPI ?Patient presents today to [facility] to see pharmacy team for [place] new start. Patient last seen by Marland Kitchen on 04/13/21. Patient has asthma-COPD overlap syndrome with eosinophilia.  PMH also significant for HTN, hx PE. Chronic hep C ? ?Respiratory Medications ?Current regimen: Advair HFA 230/21 mcg (2 puffs twice daily), Spiriva 2.5 mcg (2 puffs once daily) ?Patient reports no known adherence challenges ? ?OBJECTIVE ?No Known Allergies ? ?Outpatient Encounter Medications as of 04/26/2021  ?Medication Sig  ? albuterol (PROVENTIL) (2.5 MG/3ML) 0.083% nebulizer solution Take 3 mLs (2.5 mg total) by nebulization every 6 (six) hours as needed for wheezing or shortness of breath.  ? albuterol (VENTOLIN HFA) 108 (90 Base) MCG/ACT inhaler Inhale 2 puffs into the lungs every 6 (six) hours as needed for wheezing or shortness of breath.  ? amLODipine (NORVASC) 5 MG tablet Take 5 mg by mouth 2 (two) times daily.  ? atorvastatin (LIPITOR) 20 MG tablet Take 20 mg by mouth daily.  ? Dupilumab (DUPIXENT) 300 MG/2ML SOPN Inject '600mg'$  into the skin at Week 0, then '300mg'$  into the skin every 14 days thereafter. Courier to pulm: [address], [address], [place] [address].  Appt on 04/19/21  ? fluticasone-salmeterol (ADVAIR HFA) 230-21 MCG/ACT inhaler Inhale 2 puffs into the lungs 2 (two) times daily.  ? guaiFENesin (MUCINEX) 600 MG 12 hr tablet Take 1 tablet (600 mg total) by mouth 2 (two) times daily.  ? losartan (COZAAR) 50 MG tablet Take 50 mg by mouth daily.  ? Misc. Devices MISC Please dispense 1 flutter valve for use after inhalers  ? predniSONE (DELTASONE) 10 MG tablet 4 tabs for 2 days, then 3 tabs for 2 days, 2 tabs for 2 days, then 1 tab for 2 days, then stop  ? Tiotropium Bromide Monohydrate (SPIRIVA RESPIMAT) 2.5 MCG/ACT AERS Inhale 2 puffs into the lungs daily.  ? ?No facility-administered encounter medications on file as of 04/26/2021.  ?  ? ?Immunization History  ?Administered Date(s)  Administered  ? Influenza, High Dose Seasonal PF 12/08/2020  ? Moderna Sars-Covid-2 Vaccination 03/25/2019, 04/22/2019, 09/30/2019  ? Pneumococcal-Unspecified 12/08/2020  ?  ? ?PFTs ? ?  Latest Ref Rng & Units 02/20/2021  ?  2:58 PM  ?PFT Results  ?FVC-Pre L 3.20    ?FVC-Predicted Pre % 84    ?FVC-Post L 3.30    ?FVC-Predicted Post % 87    ?Pre FEV1/FVC % % 49    ?Post FEV1/FCV % % 51    ?FEV1-Pre L 1.57    ?FEV1-Predicted Pre % 56    ?FEV1-Post L 1.70    ?DLCO uncorrected ml/min/mmHg 14.74    ?DLCO UNC% % 64    ?DLCO corrected ml/min/mmHg 14.74    ?DLCO COR %Predicted % 64    ?DLVA Predicted % 67    ?TLC L 6.54    ?TLC % Predicted % 105    ?RV % Predicted % 131    ?  ? ?Eosinophils ?Most recent blood eosinophil count was 300 cells/microL taken on 04/09/21.  ? ?IgE: 58 on 04/09/21 ? ?Assessment  ? ?Biologics training for dupilumab (Dupixent) ? ?Goals of therapy: ?Mechanism: human monoclonal IgG4 antibody that inhibits interleukin-4 and interleukin-13 cytokine-induced responses, including release of proinflammatory cytokines, chemokines, and IgE ?Reviewed that Dupixent is add-on medication and patient must continue maintenance inhaler regimen. ?Response to therapy: may take 4 months to determine efficacy. Discussed that patients generally feel improvement sooner than 4 months. ? ?Side effects: injection site reaction (  6-18%), antibody development (5-16%), ophthalmic conjunctivitis (2-16%), transient blood eosinophilia (1-2%) ? ?Dose: '600mg'$  at Week 0 (administered today in clinic) followed by '300mg'$  every 14 days thereafter ? ?Administration/Storage:  ?Reviewed administration sites of thigh or abdomen (at least 2-3 inches away from abdomen). Reviewed the upper arm is only appropriate if caregiver is administering injection  ?Do not shake pen/syringe as this could lead to product foaming or precipitation. ?Do not use if solution is discolored or contains particulate matter or if window on prefilled pen is yellow (indicates  pen has been used).  ?Reviewed storage of medication in refrigerator. Reviewed that [place] can be stored at room temperature in unopened carton for up to 14 days. ? ?Access: ?Approval of Dupixent through: insurance ? ?Patient self-administered Dupixent in right and left thigh using [facility]-supplied medication ?Dupixent '300mg'$ /49m autoinjector pen x 2 pens = '600mg'$  ?NDC: [phone]?Lot: [id number] ?Exp: 02/21/2023 ? ?Monitored for 30 minutes. No injection site reaction. Patient reports no issues - no itchiness, irritation at injection site, or other issues. ? ?Medication Reconciliation ? ?A drug regimen assessment was performed, including review of allergies, interactions, disease-state management, dosing and immunization history. Medications were reviewed with the patient, including name, instructions, indication, goals of therapy, potential side effects, importance of adherence, and safe use. ? ?Drug interaction(s): none noted ? ?Prednisone taper completed ? ?Immunizations ? ?Patient is indicated for the influenzae, pneumonia, and shingles vaccinations. ?Patient has received 3 COVID19 vaccines. ? ?PLAN ?Continue Dupixent '300mg'$  every 14 days.  Next dose is due 05/10/21 and every 14 days thereafter. [facility]: [phone].  Patient provided with pharmacy phone number and advised that they will call him 3 weeks to schedule shipment of medication to his home. ?Continue maintenance asthma regimen of: Advair HFA 230/21 mcg (2 puffs twice daily), Spiriva 2.5 mcg (2 puffs once daily) ?Patient request refill of nebs for 3 month supply be sent. Rx sent to local pharmacy today ? ?All questions encouraged and answered.  Instructed patient to reach out with any further questions or concerns. ? ?Thank you for allowing pharmacy to participate in this patient's care. ? ?This appointment required 60 minutes of patient care (this includes precharting, chart review, review of results,  face-to-face care, etc.).  ? ?DKnox Saliva PharmD, MPH, BCPS ?Clinical Pharmacist (Rheumatology and Pulmonology) ?

## 2021-04-26 NOTE — Patient Instructions (Signed)
Your next [place] dose is due on 05/10/21, 05/24/21, and every 14 days thereafter ? ?Remember to pull just one pen out of the box at least 30 minutes (or even the night before) to ensure it is at room temperature ? ?CONTINUE Advair HFA 230/21 mcg (2 puffs twice daily), Spiriva 2.5 mcg (2 puffs once daily) ? ?Your prescription will be shipped from [facility]. Their phone number is [phone] ?Please call to schedule shipment and confirm address. They will mail your medication to your home. ? ?You will need to be seen by your provider in 3 to 4 months to assess how [place] [facility] is working for you. Please ensure you have a follow-up appointment scheduled in July or August 2023. Call our clinic if you need to make this appointment. ? ?How to manage an injection site reaction: ?Remember the 5 C's: ?COUNTER - leave on the counter at least 30 minutes but up to overnight to bring medication to room temperature. This may help prevent stinging ?COLD - place something cold (like an ice gel pack or cold water bottle) on the injection site just before cleansing with alcohol. This may help reduce pain ?CLARITIN - use Claritin (generic name is loratadine) for the first two weeks of treatment or the day of, the day before, and the day after injecting. This will help to minimize injection site reactions ?CORTISONE CREAM - apply if injection site is irritated and itching ?CALL ME - if injection site reaction is bigger than the size of your fist, looks infected, blisters, or if you develop hives  ?

## 2021-05-01 NOTE — Telephone Encounter (Signed)
Pt had dupixent visit with [facility] 4/6. ? ?Attempted to call pt but unable to reach. Left message for him to return all. ?

## 2021-05-07 ENCOUNTER — Other Ambulatory Visit (HOSPITAL_COMMUNITY): Payer: Self-pay

## 2021-05-08 ENCOUNTER — Telehealth: Payer: Self-pay | Admitting: Internal Medicine

## 2021-05-08 ENCOUNTER — Encounter: Payer: Self-pay | Admitting: Internal Medicine

## 2021-05-08 ENCOUNTER — Telehealth (INDEPENDENT_AMBULATORY_CARE_PROVIDER_SITE_OTHER): Payer: Medicare Other | Admitting: Internal Medicine

## 2021-05-08 DIAGNOSIS — J4551 Severe persistent asthma with (acute) exacerbation: Secondary | ICD-10-CM

## 2021-05-08 DIAGNOSIS — J449 Chronic obstructive pulmonary disease, unspecified: Secondary | ICD-10-CM | POA: Diagnosis not present

## 2021-05-08 DIAGNOSIS — J4541 Moderate persistent asthma with (acute) exacerbation: Secondary | ICD-10-CM | POA: Diagnosis not present

## 2021-05-08 MED ORDER — SODIUM CHLORIDE 0.9 % IN NEBU: 3.0000 mL | INHALATION_SOLUTION | Freq: Three times a day (TID) | RESPIRATORY_TRACT | 12 refills | Status: DC | PRN

## 2021-05-08 NOTE — Patient Instructions (Signed)
Follow up as scheduled with me in May.  ? ?Call our office if you are not continuing to improve by Thursday. ? ?Start taking albuterol nebulizer three times a day.  ?Follow this with nebulized saline solution. ?Then use a flutter valve. ?The goal is to open up your airways, moisturize them, and then use the flutter valve to bring up mucus.  ? ?We can use this to help prevent mucus impaction and flares of your asthma. ? ?Continue dupixent.  ? ?

## 2021-05-08 NOTE — Progress Notes (Addendum)
?I connected with Drexel Iha  on 05/08/21 at  4:00 PM EDT by telephone and verified that I am speaking with the correct person using two identifiers. ? ?Location: ?Patient: Home ?Provider: [facility] ?  ?I discussed the limitations, risks, security and privacy concerns of performing an evaluation and management service by telephone and the availability of in person appointments. I also discussed with the patient that there may be a patient responsible charge related to this service. The patient expressed understanding and agreed to proceed. ? ?      ?DENNIE MOLTZ    [id number]    71 ? ?Primary Care Physician:[name], [name], PA ?Date of Appointment: 05/08/2021 ?Established Patient Visit ? ?Chief complaint:   ?Chief Complaint  ?Patient presents with  ? Shortness of Breath  ? ? ? ?HPI: ?BRAILEN MACNEAL is a 71 y.o. man with history of allergic asthma COPD overlap syndrome. ? ?Interval Updates: ?This is a sick visit. Here with 2 days of worsening shortness of breath and cough. Has daily green mucus production which is hard to bring up, doesn't feel its any worse than  usual. No documented fevers. He does have wheezing.  ? ?He started first dupixent injection 04/26/2021. Did well, just had a small bruise. No perceived improvement yet.  ? ?Took albuterol nebs 2-3 times overnight. Slept poorly last night but this morning got more sleep. He is now feeling better after the nebs and getting some sleep.  ? ?Wheezing has resolved. No increased work of breathing. Did not cough during our encounter.  ? ?He did have a flutter valve but didn't find it to be very helpful - felt like it was hard to bring things up and they were getting stuck.  ? ?Current Regimen: spiriva, advair, mucinex,  prn albuterol, dupixent.  ?Asthma Triggers: allergies, viral infections.  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 8:59 AM 17  ? ?FeNO: 53 ppb ? ? ?I have reviewed the patient's family social and past medical history and updated as appropriate.  ? ?Past Medical History:  ?Diagnosis Date  ? Bladder cancer ([place])   ? BPH (benign prostatic hyperplasia)   ? Chronic hepatitis C ([place])   ? treated with anti-virals  ? Pulmonary embolism ([place])   ? ? ?Past Surgical History:  ?Procedure Laterality Date  ? ABDOMINAL HERNIA REPAIR    ? CYSTOSCOPY    ? ? ?Family History  ?Problem Relation Age of Onset  ? Pulmonary embolism Mother   ? Emphysema Father   ? Lung cancer Sister   ? Asthma Child   ? Asthma Child   ? ? ?Social History  ? ?Occupational History  ? Not on file  ?Tobacco Use  ? Smoking status: Former  ?  Packs/day: 1.00  ?  Years: 20.00  ?  Pack years: 20.00  ?  Types: Cigarettes  ?  Quit date: 60  ?  Years since quitting: 31.3  ? Smokeless tobacco: Never  ?Substance and Sexual Activity  ? Alcohol use: Not on file  ? Drug use: Not on file  ? Sexual activity: Not on file  ? ? ? ?Physical Exam: ?There were no vitals taken for this visit. ?Due to nature of visit ? ?No distress, no increased work of  breathing ?No wheezing ? ? ?Data Reviewed: ?Imaging: ?I have personally reviewed the CT Chest which shows mild upper lobe predominant emphysema. Mild airway thickening.  ? ?PFTs: ? ? ?  Latest Ref Rng & Units 02/20/2021  ?  2:58 PM  ?PFT Results  ?FVC-Pre L 3.20    ?FVC-Predicted Pre % 84    ?FVC-Post L 3.30    ?FVC-Predicted Post % 87    ?Pre FEV1/FVC % % 49    ?Post FEV1/FCV % % 51    ?FEV1-Pre L 1.57    ?FEV1-Predicted Pre % 56    ?FEV1-Post L 1.70    ?DLCO uncorrected ml/min/mmHg 14.74    ?DLCO UNC% % 64    ?DLCO corrected ml/min/mmHg 14.74    ?DLCO COR %Predicted % 64    ?DLVA Predicted % 67    ?TLC L 6.54    ?TLC % Predicted % 105    ?RV % Predicted % 131    ? ?I have  personally reviewed the patient's PFTs and moderately severe airflow limitation. Reduced diffusion capacity ? ?Labs: ? ?Immunization status: ?Immunization History  ?Administered Date(s) Administered  ? Influenza, High Dose Seasonal PF 12/08/2020  ? Moderna Sars-Covid-2 Vaccination 03/25/2019, 04/22/2019, 09/30/2019  ? Pneumococcal-Unspecified 12/08/2020  ? ? ?External Records Personally Reviewed: extensive records reviewed from hospital stay in [place], [place] ? ?Assessment:  ?Allergic Asthma COPD Overlap Syndrome with acute exacerbation ?Radiographic emphysema ?Acinetobacter colonization ? ?Plan/Recommendations: ?Given that his symptoms are improving with nebulizer therapy at this time, would continue albuterol nebs TID. Will add nebulized saline and continue flutter valve for airway clearance. ?Continue spiriva, advair and dupixent.  ?He is scheduled for TURP on Friday. He says he is feeling much better now. I see no reason he can't proceed with procedure as long as he continues to improve. He will call us if not improving.  ? ?I spent 30 minutes in the care of this patient today including pre-charting, chart review, review of results, face-to-face care, coordination of care and communication with consultants etc.). ? ?15 of these minutes were spent on the video encoutner.  ? ?Return to Care: ?No follow-ups on file.  ?He is scheduled with me on May 4th and I will see him then.  ? ? ?Lenice Llamas, MD ?Pulmonary and Critical Care Medicine ?[place] ?Office:[phone] ? ? ? ? ? ?

## 2021-05-08 NOTE — Telephone Encounter (Signed)
Spoke with the pt  ?He is c/o increased SOB, cough with green sputum and wheezing x 2 days  ?He had chills and aches last night and thinks he was running a fever but did not have thermometer  ?He is taking his spiriva, advair, mucinex, albuterol nebs  ?He is scheduled with Katie for 05/10/21  ?No sooner openings  ?Please advise thanks! ? ?No Known Allergies ? ?

## 2021-05-08 NOTE — Telephone Encounter (Signed)
Spoke with the pt  ?He was agreeable to video visit  ?This is scheduled for 4 pm 05/08/21  and I have arrived the appt and sent link to pt's cell ?

## 2021-05-08 NOTE — Telephone Encounter (Signed)
Is he willing to do a video visit with me today at 4pm? ?

## 2021-05-09 ENCOUNTER — Other Ambulatory Visit: Payer: Self-pay

## 2021-05-09 ENCOUNTER — Encounter (HOSPITAL_BASED_OUTPATIENT_CLINIC_OR_DEPARTMENT_OTHER): Payer: Self-pay | Admitting: Urology

## 2021-05-09 DIAGNOSIS — Z86711 Personal history of pulmonary embolism: Secondary | ICD-10-CM | POA: Diagnosis not present

## 2021-05-09 DIAGNOSIS — D090 Carcinoma in situ of bladder: Secondary | ICD-10-CM | POA: Diagnosis not present

## 2021-05-09 DIAGNOSIS — I10 Essential (primary) hypertension: Secondary | ICD-10-CM | POA: Diagnosis not present

## 2021-05-09 DIAGNOSIS — Z7951 Long term (current) use of inhaled steroids: Secondary | ICD-10-CM | POA: Diagnosis not present

## 2021-05-09 DIAGNOSIS — Z20822 Contact with and (suspected) exposure to covid-19: Secondary | ICD-10-CM | POA: Diagnosis not present

## 2021-05-09 DIAGNOSIS — J449 Chronic obstructive pulmonary disease, unspecified: Secondary | ICD-10-CM | POA: Diagnosis not present

## 2021-05-09 DIAGNOSIS — Z87891 Personal history of nicotine dependence: Secondary | ICD-10-CM | POA: Diagnosis not present

## 2021-05-09 DIAGNOSIS — B1920 Unspecified viral hepatitis C without hepatic coma: Secondary | ICD-10-CM | POA: Diagnosis not present

## 2021-05-09 DIAGNOSIS — N401 Enlarged prostate with lower urinary tract symptoms: Secondary | ICD-10-CM | POA: Diagnosis present

## 2021-05-09 LAB — RESP PANEL BY RT-PCR (FLU A&B, COVID) ARPGX2
Influenza A by PCR: NEGATIVE
Influenza B by PCR: NEGATIVE
SARS Coronavirus 2 by RT PCR: NEGATIVE

## 2021-05-09 NOTE — Progress Notes (Addendum)
Spoke w/ via phone for pre-op interview--- pt ?Lab needs dos----  [facility] and ekg             ?Lab results------ no ?COVID test -----pt schedule for covid test 05-09-2021 @ 1300 pt aware to call front desk when arrive in front of building, due to upper airway symptoms ? ?Arrive at ------- 0530 on 05-11-2021 ?NPO after MN NO Solid Food.  Clear liquids from MN until--- 0430 ?Med rec completed ?Medications to take morning of surgery ----- norvasc, lipitor, mucinex, advair inhaler, spiriva inhaler, and nebulizer ?Diabetic medication ----- n/a ?Patient instructed no nail polish to be worn day of surgery ?Patient instructed to bring photo id and insurance card day of surgery ?Patient aware to have Driver (ride ) / caregiver for 24 hours after surgery --- karen ?Patient Special Instructions ----- asked to bring rescue inhaler dos ?Pre-Op special Istructions ----- pt has pulmonogy office note dated 05-08-2021 by Dr Shearon Stalls stated pt can proceed with surgery if he continues to improve. ? ?Patient verbalized understanding of instructions that were given at this phone interview. ?Patient denies  chest pain, fever at this phone interview.  ? ? ?Anesthesia Review: HTN;  Asthma/ COPD overlap syndrome, Emphysema, Eosinophilia (last exacerbation dx 04-13-2021 completed prednisone)  Pt had follow-up visit w/ pulmonologist 05-08-2021 in epic/ chart.   ?Pt stated he is feeling better back to his baseline. ? ?Chart reviewed w/ Dr Christella Hartigan [facility].  Stated pt would need covid test and he should keep doing nebulizer TID as directed and do morning of surgery with his regular inhaler's and pt would be assessed morning of surgery. ? ?Chest x-ray : Chest CT 01-02-2021 epic ?EKG : no ?Echo : no ?Stress test: no ?Activity level:  pt DOE due to COPD with activity ?Sleep Study/ CPAP : Yes/ No ?

## 2021-05-10 ENCOUNTER — Ambulatory Visit: Payer: Medicare Other | Admitting: Nurse Practitioner

## 2021-05-10 NOTE — Anesthesia Preprocedure Evaluation (Addendum)
Anesthesia Evaluation  ?Patient identified by MRN, date of birth, ID band ?Patient awake ? ? ? ?Reviewed: ?Allergy & Precautions, NPO status , Patient's Chart, lab work & pertinent test results ? ?Airway ?Mallampati: II ? ?TM Distance: >3 FB ?Neck ROM: Full ? ? ? Dental ? ?(+) Lower Dentures, Upper Dentures ?  ?Pulmonary ?asthma , sleep apnea , COPD (chronic cough; recent exacerbation),  COPD inhaler, former smoker,  ?  ? ?+ wheezing ? ? ? ? ? Cardiovascular ?hypertension, Pt. on medications ?Normal cardiovascular exam ?Rhythm:Regular Rate:Normal ? ?H/o PE s/p IVC filter placement ?  ?Neuro/Psych ?  ? GI/Hepatic ?(+) Hepatitis - (treated), C  ?Endo/Other  ? ? Renal/GU ?  ? ?  ?Musculoskeletal ? ? Abdominal ?  ?Peds ? Hematology ?  ?Anesthesia Other Findings ? ? Reproductive/Obstetrics ? ?  ? ? ? ? ? ? ? ? ? ? ? ? ? ?  ?  ? ? ? ? ? ? ? ?Anesthesia Physical ?Anesthesia Plan ? ?ASA: 3 ? ?Anesthesia Plan: General  ? ?Post-op Pain Management: Tylenol PO (pre-op)*  ? ?Induction: Intravenous ? ?PONV Risk Score and Plan: 2 and Treatment may vary due to age or medical condition, Ondansetron and Dexamethasone ? ?Airway Management Planned: LMA ? ?Additional Equipment: None ? ?Intra-op Plan:  ? ?Post-operative Plan: Extubation in OR ? ?Informed Consent: I have reviewed the patients History and Physical, chart, labs and discussed the procedure including the risks, benefits and alternatives for the proposed anesthesia with the patient or authorized representative who has indicated his/her understanding and acceptance.  ? ? ? ?Dental advisory given ? ?Plan Discussed with: Anesthesiologist, CRNA and Surgeon ? ?Anesthesia Plan Comments: (Patient used nebulizer this AM. Will have him use inhaler prior to going to OR. Plan for LMA. Norton Blizzard, MD  ?)  ? ? ? ? ? ?Anesthesia Quick Evaluation ? ?

## 2021-05-11 ENCOUNTER — Encounter (HOSPITAL_BASED_OUTPATIENT_CLINIC_OR_DEPARTMENT_OTHER): Payer: Self-pay | Admitting: Urology

## 2021-05-11 ENCOUNTER — Other Ambulatory Visit (HOSPITAL_COMMUNITY): Payer: Self-pay

## 2021-05-11 ENCOUNTER — Ambulatory Visit (HOSPITAL_BASED_OUTPATIENT_CLINIC_OR_DEPARTMENT_OTHER): Payer: Medicare Other | Admitting: Anesthesiology

## 2021-05-11 ENCOUNTER — Other Ambulatory Visit: Payer: Self-pay

## 2021-05-11 ENCOUNTER — Ambulatory Visit (HOSPITAL_BASED_OUTPATIENT_CLINIC_OR_DEPARTMENT_OTHER)
Admission: RE | Admit: 2021-05-11 | Discharge: 2021-05-11 | Disposition: A | Discharge status: Home / Self Care | Payer: Medicare Other | Attending: Urology | Admitting: Urology

## 2021-05-11 ENCOUNTER — Encounter (HOSPITAL_BASED_OUTPATIENT_CLINIC_OR_DEPARTMENT_OTHER)
Admission: RE | Disposition: A | Discharge status: Home / Self Care | Payer: Self-pay | Source: Home / Self Care | Attending: Urology

## 2021-05-11 DIAGNOSIS — G4730 Sleep apnea, unspecified: Secondary | ICD-10-CM | POA: Diagnosis not present

## 2021-05-11 DIAGNOSIS — Z20822 Contact with and (suspected) exposure to covid-19: Secondary | ICD-10-CM | POA: Diagnosis not present

## 2021-05-11 DIAGNOSIS — J441 Chronic obstructive pulmonary disease with (acute) exacerbation: Secondary | ICD-10-CM

## 2021-05-11 DIAGNOSIS — I10 Essential (primary) hypertension: Secondary | ICD-10-CM

## 2021-05-11 DIAGNOSIS — N401 Enlarged prostate with lower urinary tract symptoms: Secondary | ICD-10-CM | POA: Diagnosis not present

## 2021-05-11 DIAGNOSIS — Z01818 Encounter for other preprocedural examination: Secondary | ICD-10-CM

## 2021-05-11 DIAGNOSIS — J449 Chronic obstructive pulmonary disease, unspecified: Secondary | ICD-10-CM | POA: Insufficient documentation

## 2021-05-11 DIAGNOSIS — Z86711 Personal history of pulmonary embolism: Secondary | ICD-10-CM | POA: Insufficient documentation

## 2021-05-11 DIAGNOSIS — N3289 Other specified disorders of bladder: Secondary | ICD-10-CM | POA: Diagnosis not present

## 2021-05-11 DIAGNOSIS — N138 Other obstructive and reflux uropathy: Secondary | ICD-10-CM | POA: Diagnosis not present

## 2021-05-11 DIAGNOSIS — Z87891 Personal history of nicotine dependence: Secondary | ICD-10-CM | POA: Diagnosis not present

## 2021-05-11 DIAGNOSIS — Z7951 Long term (current) use of inhaled steroids: Secondary | ICD-10-CM | POA: Diagnosis not present

## 2021-05-11 DIAGNOSIS — D090 Carcinoma in situ of bladder: Secondary | ICD-10-CM | POA: Insufficient documentation

## 2021-05-11 DIAGNOSIS — B1920 Unspecified viral hepatitis C without hepatic coma: Secondary | ICD-10-CM | POA: Insufficient documentation

## 2021-05-11 HISTORY — DX: Emphysema, unspecified: J43.9

## 2021-05-11 HISTORY — DX: Other forms of dyspnea: R06.09

## 2021-05-11 HISTORY — DX: Presence of spectacles and contact lenses: Z97.3

## 2021-05-11 HISTORY — DX: Chronic cough: R05.3

## 2021-05-11 HISTORY — DX: Poor urinary stream: R39.12

## 2021-05-11 HISTORY — DX: Unspecified osteoarthritis, unspecified site: M19.90

## 2021-05-11 HISTORY — DX: Obstructive sleep apnea (adult) (pediatric): G47.33

## 2021-05-11 HISTORY — DX: Personal history of COVID-19: Z86.16

## 2021-05-11 HISTORY — DX: Essential (primary) hypertension: I10

## 2021-05-11 HISTORY — DX: Benign prostatic hyperplasia with lower urinary tract symptoms: N40.1

## 2021-05-11 HISTORY — DX: Other specified chronic obstructive pulmonary disease: J44.89

## 2021-05-11 HISTORY — DX: Chronic obstructive pulmonary disease, unspecified: J44.9

## 2021-05-11 HISTORY — DX: Other pulmonary eosinophilia, not elsewhere classified: J82.89

## 2021-05-11 HISTORY — DX: Personal history of pulmonary embolism: Z86.711

## 2021-05-11 HISTORY — DX: Malabsorption due to intolerance, not elsewhere classified: K90.49

## 2021-05-11 HISTORY — DX: Complete loss of teeth, unspecified cause, unspecified class: K08.109

## 2021-05-11 HISTORY — PX: TRANSURETHRAL RESECTION OF PROSTATE: SHX73

## 2021-05-11 LAB — POCT I-STAT, CHEM 8
BUN: 45 mg/dL — ABNORMAL HIGH (ref 8–23)
Calcium, Ion: 1.24 mmol/L (ref 1.15–1.40)
Chloride: 108 mmol/L (ref 98–111)
Creatinine, Ser: 1.7 mg/dL — ABNORMAL HIGH (ref 0.61–1.24)
Glucose, Bld: 89 mg/dL (ref 70–99)
HCT: 37 % — ABNORMAL LOW (ref 39.0–52.0)
Hemoglobin: 12.6 g/dL — ABNORMAL LOW (ref 13.0–17.0)
Potassium: 4.1 mmol/L (ref 3.5–5.1)
Sodium: 141 mmol/L (ref 135–145)
TCO2: 24 mmol/L (ref 22–32)

## 2021-05-11 SURGERY — TURP (TRANSURETHRAL RESECTION OF PROSTATE)
Anesthesia: General | Site: Prostate

## 2021-05-11 MED ORDER — MIDAZOLAM HCL 5 MG/5ML IJ SOLN
INTRAMUSCULAR | Status: DC | PRN
  Administered 2021-05-11: 2 mg via INTRAVENOUS

## 2021-05-11 MED ORDER — MIDAZOLAM HCL 2 MG/2ML IJ SOLN
INTRAMUSCULAR | Status: AC
  Filled 2021-05-11: qty 2

## 2021-05-11 MED ORDER — LACTATED RINGERS IV SOLN: INTRAVENOUS | Status: DC

## 2021-05-11 MED ORDER — CEFAZOLIN SODIUM-DEXTROSE 2-4 GM/100ML-% IV SOLN
INTRAVENOUS | Status: AC
Start: 2021-05-11 — End: ?
  Filled 2021-05-11: qty 100

## 2021-05-11 MED ORDER — OXYCODONE HCL 5 MG/5ML PO SOLN: 5.0000 mg | Freq: Once | ORAL | Status: DC | PRN

## 2021-05-11 MED ORDER — PROPOFOL 10 MG/ML IV BOLUS
INTRAVENOUS | Status: DC | PRN
  Administered 2021-05-11: 150 mg via INTRAVENOUS
  Administered 2021-05-11: 30 mg via INTRAVENOUS

## 2021-05-11 MED ORDER — ONDANSETRON HCL 4 MG/2ML IJ SOLN
INTRAMUSCULAR | Status: DC | PRN
  Administered 2021-05-11: 4 mg via INTRAVENOUS

## 2021-05-11 MED ORDER — ONDANSETRON HCL 4 MG/2ML IJ SOLN: 4.0000 mg | Freq: Once | INTRAMUSCULAR | Status: DC | PRN

## 2021-05-11 MED ORDER — DEXAMETHASONE SODIUM PHOSPHATE 10 MG/ML IJ SOLN
INTRAMUSCULAR | Status: DC | PRN
  Administered 2021-05-11: 10 mg via INTRAVENOUS

## 2021-05-11 MED ORDER — ONDANSETRON HCL 4 MG/2ML IJ SOLN
INTRAMUSCULAR | Status: AC
  Filled 2021-05-11: qty 2

## 2021-05-11 MED ORDER — FENTANYL CITRATE (PF) 100 MCG/2ML IJ SOLN: 25.0000 ug | INTRAMUSCULAR | Status: DC | PRN

## 2021-05-11 MED ORDER — FENTANYL CITRATE (PF) 100 MCG/2ML IJ SOLN
INTRAMUSCULAR | Status: AC
  Filled 2021-05-11: qty 2

## 2021-05-11 MED ORDER — AMISULPRIDE (ANTIEMETIC) 5 MG/2ML IV SOLN: 10.0000 mg | Freq: Once | INTRAVENOUS | Status: DC | PRN

## 2021-05-11 MED ORDER — ACETAMINOPHEN 500 MG PO TABS
ORAL_TABLET | ORAL | Status: AC
  Filled 2021-05-11: qty 2

## 2021-05-11 MED ORDER — CEFAZOLIN SODIUM-DEXTROSE 2-4 GM/100ML-% IV SOLN
2.0000 g | Freq: Once | INTRAVENOUS | Status: AC
  Administered 2021-05-11: 2 g via INTRAVENOUS

## 2021-05-11 MED ORDER — OXYCODONE HCL 5 MG PO TABS: 5.0000 mg | ORAL_TABLET | Freq: Once | ORAL | Status: DC | PRN

## 2021-05-11 MED ORDER — CEPHALEXIN 500 MG PO CAPS
500.0000 mg | ORAL_CAPSULE | Freq: Every day | ORAL | 0 refills | Status: DC
Start: 2021-05-11 — End: 2021-05-24
  Filled 2021-05-11: qty 7, 7d supply, fill #0

## 2021-05-11 MED ORDER — PROPOFOL 10 MG/ML IV BOLUS
INTRAVENOUS | Status: AC
Start: 2021-05-11 — End: ?
  Filled 2021-05-11: qty 20

## 2021-05-11 MED ORDER — DEXAMETHASONE SODIUM PHOSPHATE 10 MG/ML IJ SOLN
INTRAMUSCULAR | Status: AC
  Filled 2021-05-11: qty 1

## 2021-05-11 MED ORDER — LIDOCAINE HCL (CARDIAC) PF 100 MG/5ML IV SOSY
PREFILLED_SYRINGE | INTRAVENOUS | Status: DC | PRN
  Administered 2021-05-11: 50 mg via INTRAVENOUS

## 2021-05-11 MED ORDER — ACETAMINOPHEN 500 MG PO TABS
1000.0000 mg | ORAL_TABLET | Freq: Once | ORAL | Status: AC
  Administered 2021-05-11: 1000 mg via ORAL

## 2021-05-11 MED ORDER — GLYCOPYRROLATE PF 0.2 MG/ML IJ SOSY
PREFILLED_SYRINGE | INTRAMUSCULAR | Status: AC
  Filled 2021-05-11: qty 1

## 2021-05-11 MED ORDER — FENTANYL CITRATE (PF) 100 MCG/2ML IJ SOLN
INTRAMUSCULAR | Status: DC | PRN
  Administered 2021-05-11: 50 ug via INTRAVENOUS
  Administered 2021-05-11 (×2): 25 ug via INTRAVENOUS

## 2021-05-11 MED ORDER — GLYCOPYRROLATE 0.2 MG/ML IJ SOLN
INTRAMUSCULAR | Status: DC | PRN
  Administered 2021-05-11: .2 mg via INTRAVENOUS

## 2021-05-11 MED ORDER — SODIUM CHLORIDE 0.9 % IR SOLN
Status: DC | PRN
Start: 2021-05-11 — End: 2021-05-11
  Administered 2021-05-11: 3000 mL
  Administered 2021-05-11: 6000 mL

## 2021-05-11 SURGICAL SUPPLY — 28 items
BAG DRAIN URO-CYSTO SKYTR STRL (DRAIN) ×2 IMPLANT
BAG DRN RND TRDRP ANRFLXCHMBR (UROLOGICAL SUPPLIES) ×1
BAG DRN UROCATH (DRAIN) ×1
BAG URINE DRAIN 2000ML AR STRL (UROLOGICAL SUPPLIES) ×2 IMPLANT
BAG URINE LEG 500ML (DRAIN) IMPLANT
CATH COUDE FOLEY 2W 5CC 20FR (CATHETERS) ×1 IMPLANT
CATH FOLEY 3WAY 30CC 22FR (CATHETERS) ×1 IMPLANT
CATH HEMA 3WAY 30CC 24FR COUDE (CATHETERS) IMPLANT
CATH HEMA 3WAY 30CC 24FR RND (CATHETERS) IMPLANT
CLOTH BEACON ORANGE TIMEOUT ST (SAFETY) ×2 IMPLANT
ELECT REM PT RETURN 9FT ADLT (ELECTROSURGICAL)
ELECTRODE REM PT RTRN 9FT ADLT (ELECTROSURGICAL) ×1 IMPLANT
EVACUATOR MICROVAS BLADDER (UROLOGICAL SUPPLIES) IMPLANT
GLOVE BIO SURGEON STRL SZ7.5 (GLOVE) ×2 IMPLANT
GLOVE BIO SURGEON STRL SZ8 (GLOVE) ×1 IMPLANT
GOWN STRL REUS W/TWL LRG LVL3 (GOWN DISPOSABLE) ×2 IMPLANT
HOLDER FOLEY CATH W/STRAP (MISCELLANEOUS) ×1 IMPLANT
IV NS IRRIG 3000ML ARTHROMATIC (IV SOLUTION) ×3 IMPLANT
KIT TURNOVER CYSTO (KITS) ×2 IMPLANT
LOOP CUT BIPOLAR 24F LRG (ELECTROSURGICAL) ×2 IMPLANT
MANIFOLD NEPTUNE II (INSTRUMENTS) ×2 IMPLANT
PACK CYSTO (CUSTOM PROCEDURE TRAY) ×2 IMPLANT
PLUG CATH AND CAP STER (CATHETERS) IMPLANT
SYR 30ML LL (SYRINGE) ×2 IMPLANT
SYR TOOMEY IRRIG 70ML (MISCELLANEOUS) ×2
SYRINGE TOOMEY IRRIG 70ML (MISCELLANEOUS) ×1 IMPLANT
TUBE CONNECTING 12X1/4 (SUCTIONS) ×2 IMPLANT
TUBING UROLOGY SET (TUBING) ×2 IMPLANT

## 2021-05-11 NOTE — Anesthesia Postprocedure Evaluation (Signed)
Anesthesia Post Note ? ?Patient: James Barker ? ?Procedure(s) Performed: PROSTATE EXAM UNDER ANESTHESIA; TRANSURETHRAL RESECTION OF THE PROSTATE (TURP), BLADDER BIOPSY (Prostate) ? ?  ? ?Patient location during evaluation: PACU ?Anesthesia Type: General ?Level of consciousness: awake ?Pain management: pain level controlled ?Vital Signs Assessment: post-procedure vital signs reviewed and stable ?Respiratory status: spontaneous breathing and respiratory function stable ?Cardiovascular status: stable ?Postop Assessment: no apparent nausea or vomiting ?Anesthetic complications: no ? ? ?No notable events documented. ? ?Last Vitals:  ?Vitals:  ? 05/11/21 0845 05/11/21 0900  ?BP: 108/83 117/76  ?Pulse: (!) 48 (!) 48  ?Resp: 11 10  ?Temp:    ?SpO2: 100% 98%  ?  ?Last Pain:  ?Vitals:  ? 05/11/21 0900  ?TempSrc:   ?PainSc: 0-No pain  ? ? ?  ?  ?  ?  ?  ?  ? ?Merlinda Frederick ? ? ? ? ?

## 2021-05-11 NOTE — Discharge Instructions (Addendum)
Remove foley catheter on Tuesday morning, 05/15/2021 as instructed.  ? ? ?Post Anesthesia Home Care Instructions ? ?Activity: ?Get plenty of rest for the remainder of the day. A responsible individual must stay with you for 24 hours following the procedure.  ?For the next 24 hours, DO NOT: ?-Drive a car ?-Paediatric nurse ?-Drink alcoholic beverages ?-Take any medication unless instructed by your physician ?-Make any legal decisions or sign important papers. ? ?Meals: ?Start with liquid foods such as gelatin or soup. Progress to regular foods as tolerated. Avoid greasy, spicy, heavy foods. If nausea and/or vomiting occur, drink only clear liquids until the nausea and/or vomiting subsides. Call your physician if vomiting continues. ? ?Special Instructions/Symptoms: ?Your throat may feel dry or sore from the anesthesia or the breathing tube placed in your throat during surgery. If this causes discomfort, gargle with warm salt water. The discomfort should disappear within 24 hours. ? ? ?May take Tylenol beginning at 78 Noon as needed for pain. ?

## 2021-05-11 NOTE — Transfer of Care (Signed)
Immediate Anesthesia Transfer of Care Note ? ?Patient: James Barker ? ?Procedure(s) Performed: PROSTATE EXAM UNDER ANESTHESIA; TRANSURETHRAL RESECTION OF THE PROSTATE (TURP), BLADDER BIOPSY (Prostate) ? ?Patient Location: PACU ? ?Anesthesia Type:General ? ?Level of Consciousness: awake, alert , oriented and patient cooperative ? ?Airway & Oxygen Therapy: Patient Spontanous Breathing ? ?Post-op Assessment: Report given to RN, Post -op Vital signs reviewed and stable and Patient moving all extremities X 4 ? ?Post vital signs: Reviewed and stable ? ?Last Vitals:  ?Vitals Value Taken Time  ?BP 116/73 05/11/21 0833  ?Temp 36.3 ?C 05/11/21 0833  ?Pulse 50 05/11/21 0834  ?Resp 9 05/11/21 0838  ?SpO2 100 % 05/11/21 0834  ?Vitals shown include unvalidated device data. ? ?Last Pain:  ?Vitals:  ? 05/11/21 0540  ?TempSrc: Oral  ?PainSc: 0-No pain  ?   ? ?Patients Stated Pain Goal: 2 (05/11/21 0540) ? ?Complications: No notable events documented. ?

## 2021-05-11 NOTE — Op Note (Signed)
Preoperative diagnosis: BPH, lower urinary tract symptoms, gross hematuria ? ?Postoperative diagnosis: BPH, lower urinary tract symptoms, bladder erythema ? ?Procedure: Cystoscopy with bladder biopsy and fulguration 0.5 cm,  ?transurethral resection of prostate ? ?Surgeon: Junious Silk ? ?Anesthesia: General ? ?Indication for procedure: Obadiah is a 71 year old male with a history of BPH, prior UroLift and TURP.  He had some residual lower urinary tract symptoms and obstructive BPH from the mid to apical prostatic urethra.  He is also had a history of bladder cancer and some red urine.  Recent CT was negative.  Office cystoscopy was benign. ? ?Findings: On exam under anesthesia the penis is circumcised without mass or lesion.  Testicles descended bilaterally and palpably normal, but soft and atrophic.  Scrotum normal.  On DRE the prostate was smooth without hard area or nodule.  Left side was slightly more prominent than the right and about 40 g. ? ?On cystoscopy the urethra was unremarkable, prostatic urethra obstructed by BPH from just inside the bladder neck down to the Veru.  The bladder neck had been over resected with the resection coming up and involving the right UO which was a bit splayed out as it healed up, but had good clear E flux.  The left side resection went just up to the left UO.  Bladder neck was somewhat undermined.  No bladder neck or posterior resection was done today.  ? ?The bladder had no stone or foreign body.  Moderate trabeculation and 2 small cellules.  The right posterior diverticulum was inspected and had some erythema posteriorly which may be the source of some of the red urine the patient seen.  This was biopsied and fulgurated. ? ?Description of procedure: After consent was obtained patient brought to the operating room.  After adequate anesthesia he was placed in lithotomy position and prepped and draped in the usual sterile fashion.  Timeout was performed to confirm the patient and  procedure.  I did an exam under anesthesia.  The cystoscope was passed per urethra and the bladder inspected.  I used a 30 and 70 degree lens to do a thorough inspection.  I then went in with the cold cup biopsy forceps and biopsied the right bladder diverticulum x2 and sent that for pathology.  Then swapped out with the continuous-flow and the visual obturator and then placed the handle and loop.  The biopsy site was fulgurated.  No concern for perforation.  I then backed back into the bladder and then the prostatic urethra.  Having a well-defined bladder neck that came just inside this and resected the residual left lateral lobe tissue from just inside the bladder neck down to the Vero on the left and then on the right.  This created an excellent open channel.  No resection of the bladder neck and none distal to the verumontanum.  Hemostasis was excellent at low pressure.  Chips were evacuated.  Again bladder neck and ureteral orifice ease inspected and not involved with this resection.  Clear E flux bilaterally.  The scope was backed out and a 20 [place] coud? catheter placed.  This was left to gravity drainage with 10 cc in the balloon.  Urine clear.  He was awakened taken recovery room in stable condition. ? ?Complications: None ? ?Blood loss: Minimal ? ?Specimens to pathology: ?#1 bladder diverticular biopsy ?#2 TURP chips ? ?Drains: 20 [place] coud? catheter ? ?Disposition: Patient stable to PACU-I spoke with Santiago Glad and went over the procedure, postop care and follow-up. ? ? ?

## 2021-05-11 NOTE — H&P (Signed)
H&P ? ?Chief Complaint: BPH, lower urinary tract symptoms ? ?History of Present Illness: James Barker is a 71 year old male with a history of BPH.  He underwent prostatic urethral lift and TURP in [place].  He had more lower urinary tract symptoms recently.  He also has a history of bladder cancer.  Recent office cystoscopy noted some obstructing mid to apical prostate tissue.  He elected to proceed with TURP.  Again, history of bladder cancer.  He has noticed some red urine on and off in the toilet but no clots.  No dysuria or bladder pain.  CT scan was benign February 2023 and again office cystoscopy was benign March 2023. ? ?He has a history of COPD and an exacerbation a few days ago, but is back to baseline.  He has no shortness of breath today.  He said he has some wheezing typically every day and uses an inhaler.  He has had no fever. ? ?Past Medical History:  ?Diagnosis Date  ? Arthritis   ? Asthma-COPD overlap syndrome ([place])   ? pulmonology--- dr n desai;   last exacerbation 05-08-2021  ? Benign localized prostatic hyperplasia with lower urinary tract symptoms (LUTS)   ? Chronic cough   ? due to copd  ? Dairy product intolerance   ? DOE (dyspnea on exertion)   ? d/t asthma/ COPD  ? Emphysema, unspecified ([place])   ? Full dentures   ? History of bladder cancer 2020  ? s/p TURBT w/ chemo instillation  ? History of COVID-19   ? per pt 2021 (no at same time of pneumonia)  w/ moderate symptoms , recovered at home, that resolved back to baseline  ? History of hepatitis C 2018  ? hx chronic hepatitis C,  per pt treated and told was cured  ? History of latent tuberculosis 2017  ? pt tested positive for TB exposure, not active, treated w/ INH for 9 months  ? History of MAI infection 2021  ? never treated  ? History of pulmonary embolism   ? (05-09-2021  pt stated never had clot prior to 2017 , none since 2020, had negative blood disorder work-up)//  2017  treated w/ xarelto 6 months//  and recurrent 04/ 2020 in setting  severe UTI and deconditioning w/ prolonged admission due to intolerant to anticoagulants IVC filter inserted  ? Hypertension   ? OSA (obstructive sleep apnea)   ? per pt dx yrs ago, intolerant to cpap  ? 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 S/P IVC filter 2020  ? for PE  ? Weak urinary stream   ? Wears glasses   ? ?Past Surgical History:  ?Procedure Laterality Date  ? ANAL FISTULECTOMY    ? x5  last one 2008  ? BRONCHOSCOPY  2021  ? per pt had pneumonia   (done in [place])  ? INGUINAL HERNIA REPAIR Right 2010  ? IVC FILTER INSERTION  2020  ? in [place]  ? ORIF WRIST FRACTURE Left 2016  ? SHOULDER ARTHROSCOPY W/ ROTATOR CUFF REPAIR Left 2016  ? TRANSURETHRAL RESECTION OF BLADDER TUMOR  2020  ? in [place]  ? TRANSURETHRAL RESECTION OF PROSTATE  2021  ? in [place]  ? UMBILICAL HERNIA REPAIR  2011  ? ? ?Home Medications:  ?Medications Prior to Admission  ?Medication Sig Dispense Refill Last Dose  ? albuterol (PROVENTIL) (2.5 MG/3ML) 0.083% nebulizer solution Take 3 mLs (2.5 mg total) by nebulization every 6 (six) hours as needed for wheezing or shortness of breath. [place]  mL 3   ? albuterol (VENTOLIN HFA) 108 (90 Base) MCG/ACT inhaler Inhale 2 puffs into the lungs every 6 (six) hours as needed for wheezing or shortness of breath. (Patient taking differently: Inhale 2 puffs into the lungs every 6 (six) hours as needed for wheezing or shortness of breath.) 1 each 5 05/10/2021  ? amLODipine (NORVASC) 5 MG tablet Take 7.5 mg by mouth daily.   05/11/2021 at 0500  ? atorvastatin (LIPITOR) 20 MG tablet Take 20 mg by mouth daily.   05/10/2021  ? [START ON 06/07/2021] Dupilumab (DUPIXENT) 300 MG/2ML SOPN Inject 300 mg into the skin every 14 (fourteen) days. 12 mL 1 05/10/2021  ? fluticasone-salmeterol (ADVAIR HFA) 230-21 MCG/ACT inhaler Inhale 2 puffs into the lungs 2 (two) times daily. (Patient taking differently: Inhale 2 puffs into the lungs 2 (two) times daily.) 1 each 12 05/11/2021 at 0430  ? guaiFENesin (MUCINEX) 600 MG 12 hr tablet Take 1 tablet (600  mg total) by mouth 2 (two) times daily. (Patient taking differently: Take 600 mg by mouth 2 (two) times daily.) 60 tablet 1 05/10/2021  ? ibuprofen (ADVIL) 400 MG tablet Take 400 mg by mouth every 6 (six) hours as needed.   Past Week  ? losartan (COZAAR) 50 MG tablet Take 50 mg by mouth daily.   05/10/2021  ? Misc. Devices MISC Please dispense 1 flutter valve for use after inhalers 1 each 0 05/10/2021  ? sodium chloride 0.9 % nebulizer solution Take 3 mLs by nebulization 3 (three) times daily as needed for wheezing. 90 mL 12 05/10/2021  ? Tiotropium Bromide Monohydrate (SPIRIVA RESPIMAT) 2.5 MCG/ACT AERS Inhale 2 puffs into the lungs daily. 4 g 5 05/11/2021 at 0445  ? ?Allergies: No Known Allergies ? ?Family History  ?Problem Relation Age of Onset  ? Pulmonary embolism Mother   ? Emphysema Father   ? Lung cancer Sister   ? Asthma Child   ? Asthma Child   ? ?Social History:  reports that he quit smoking about 31 years ago. His smoking use included cigarettes. He has a 20.00 pack-year smoking history. He has never used smokeless tobacco. He reports that he does not currently use alcohol. He reports current drug use. Drug: Marijuana. ? ?ROS: ?A complete review of systems was performed.  All systems are negative except for pertinent findings as noted. ?Review of Systems  ?Respiratory:  Positive for wheezing.   ?All other systems reviewed and are negative. ? ? ?Physical Exam:  ?Vital signs in last 24 hours: ?Temp:  [97.9 ?F (36.6 ?C)] 97.9 ?F (36.6 ?C) (04/21 0540) ?Pulse Rate:  [53] 53 (04/21 0540) ?Resp:  [16] 16 (04/21 0540) ?BP: (118)/(79) 118/79 (04/21 0540) ?SpO2:  [98 %] 98 % (04/21 0540) ?Weight:  [76.4 kg] 76.4 kg (04/21 0540) ?General:  Alert and oriented, No acute distress ?HEENT: Normocephalic, atraumatic ?Cardiovascular: Regular rate and rhythm ?Lungs: Regular rate and effort ?Abdomen: Soft, nontender, nondistended, no abdominal masses ?Back: No CVA tenderness ?Extremities: No edema ?Neurologic: Grossly  intact ? ?Laboratory Data:  ?Results for orders placed or performed during the hospital encounter of 05/11/21 (from the past 24 hour(s))  ?I-STAT, chem 8     Status: Abnormal  ? Collection Time: 05/11/21  6:27 AM  ?Result Value Ref Range  ? Sodium 141 135 - 145 mmol/L  ? Potassium 4.1 3.5 - 5.1 mmol/L  ? Chloride 108 98 - 111 mmol/L  ? BUN 45 (H) 8 - 23 mg/dL  ? Creatinine, Ser 1.70 (H)  0.61 - 1.24 mg/dL  ? Glucose, Bld 89 70 - 99 mg/dL  ? Calcium, Ion 1.24 1.15 - 1.40 mmol/L  ? TCO2 24 22 - 32 mmol/L  ? Hemoglobin 12.6 (L) 13.0 - 17.0 g/dL  ? HCT 37.0 (L) 39.0 - 52.0 %  ? ?Recent Results (from the past 240 hour(s))  ?Resp Panel by RT-PCR (Flu A&B, Covid) Nasopharyngeal Swab     Status: None  ? Collection Time: 05/09/21  1:09 PM  ? Specimen: Nasopharyngeal Swab; Nasopharyngeal(NP) swabs in vial transport medium  ?Result Value Ref Range Status  ? SARS Coronavirus 2 by RT PCR NEGATIVE NEGATIVE Final  ?  Comment: (NOTE) ?SARS-CoV-2 target nucleic acids are NOT DETECTED. ? ?The SARS-CoV-2 RNA is generally detectable in upper respiratory ?specimens during the acute phase of infection. The lowest ?concentration of SARS-CoV-2 viral copies this assay can detect is ?138 copies/mL. A negative result does not preclude SARS-Cov-2 ?infection and should not be used as the sole basis for treatment or ?other patient management decisions. A negative result may occur with  ?improper specimen collection/handling, submission of specimen other ?than nasopharyngeal swab, presence of viral mutation(s) within the ?areas targeted by this assay, and inadequate number of viral ?copies(<138 copies/mL). A negative result must be combined with ?clinical observations, patient history, and epidemiological ?information. The expected result is Negative. ? ?Fact Sheet for Patients:  ?EntrepreneurPulse.com.au ? ?Fact Sheet for Healthcare Providers:  ?IncredibleEmployment.be ? ?This test is no t yet approved or  cleared by the [place] FDA and  ?has been authorized for detection and/or diagnosis of SARS-CoV-2 by ?FDA under an Emergency Use Authorization (EUA). This EUA will remain  ?in effect (meaning this test can be used

## 2021-05-11 NOTE — Anesthesia Procedure Notes (Signed)
Procedure Name: LMA Insertion ?Date/Time: 05/11/2021 7:43 AM ?Performed by: Jonna Munro, CRNA ?Pre-anesthesia Checklist: Patient identified, Emergency Drugs available, Suction available, Patient being monitored and Timeout performed ?Patient Re-evaluated:Patient Re-evaluated prior to induction ?Oxygen Delivery Method: Circle system utilized ?Preoxygenation: Pre-oxygenation with 100% oxygen ?Induction Type: IV induction ?LMA: LMA inserted ?LMA Size: 4.0 ?Number of attempts: 1 ?Placement Confirmation: positive ETCO2 and breath sounds checked- equal and bilateral ?Tube secured with: Tape ?Dental Injury: Teeth and Oropharynx as per pre-operative assessment  ? ? ? ? ?

## 2021-05-14 LAB — SURGICAL PATHOLOGY

## 2021-05-15 ENCOUNTER — Emergency Department (HOSPITAL_BASED_OUTPATIENT_CLINIC_OR_DEPARTMENT_OTHER): Payer: Medicare Other

## 2021-05-15 ENCOUNTER — Inpatient Hospital Stay (HOSPITAL_BASED_OUTPATIENT_CLINIC_OR_DEPARTMENT_OTHER)
Admission: EM | Admit: 2021-05-15 | Discharge: 2021-05-20 | DRG: 176 | Disposition: A | Discharge status: Home / Self Care | Payer: Medicare Other | Attending: Student | Admitting: Student

## 2021-05-15 ENCOUNTER — Encounter (HOSPITAL_BASED_OUTPATIENT_CLINIC_OR_DEPARTMENT_OTHER): Payer: Self-pay | Admitting: Urology

## 2021-05-15 ENCOUNTER — Other Ambulatory Visit: Payer: Self-pay

## 2021-05-15 DIAGNOSIS — D090 Carcinoma in situ of bladder: Secondary | ICD-10-CM | POA: Diagnosis not present

## 2021-05-15 DIAGNOSIS — Z86711 Personal history of pulmonary embolism: Secondary | ICD-10-CM | POA: Diagnosis present

## 2021-05-15 DIAGNOSIS — Z87891 Personal history of nicotine dependence: Secondary | ICD-10-CM

## 2021-05-15 DIAGNOSIS — I2693 Single subsegmental pulmonary embolism without acute cor pulmonale: Principal | ICD-10-CM | POA: Diagnosis present

## 2021-05-15 DIAGNOSIS — Z95828 Presence of other vascular implants and grafts: Secondary | ICD-10-CM

## 2021-05-15 DIAGNOSIS — Z8551 Personal history of malignant neoplasm of bladder: Secondary | ICD-10-CM | POA: Diagnosis not present

## 2021-05-15 DIAGNOSIS — Z91011 Allergy to milk products: Secondary | ICD-10-CM | POA: Diagnosis not present

## 2021-05-15 DIAGNOSIS — Z825 Family history of asthma and other chronic lower respiratory diseases: Secondary | ICD-10-CM

## 2021-05-15 DIAGNOSIS — N1831 Chronic kidney disease, stage 3a: Secondary | ICD-10-CM

## 2021-05-15 DIAGNOSIS — Z79899 Other long term (current) drug therapy: Secondary | ICD-10-CM

## 2021-05-15 DIAGNOSIS — Z7951 Long term (current) use of inhaled steroids: Secondary | ICD-10-CM

## 2021-05-15 DIAGNOSIS — G4733 Obstructive sleep apnea (adult) (pediatric): Secondary | ICD-10-CM | POA: Diagnosis present

## 2021-05-15 DIAGNOSIS — I700 Atherosclerosis of aorta: Secondary | ICD-10-CM | POA: Diagnosis not present

## 2021-05-15 DIAGNOSIS — I2694 Multiple subsegmental pulmonary emboli without acute cor pulmonale: Secondary | ICD-10-CM | POA: Diagnosis not present

## 2021-05-15 DIAGNOSIS — I10 Essential (primary) hypertension: Secondary | ICD-10-CM | POA: Diagnosis present

## 2021-05-15 DIAGNOSIS — N323 Diverticulum of bladder: Secondary | ICD-10-CM | POA: Diagnosis not present

## 2021-05-15 DIAGNOSIS — Z8616 Personal history of COVID-19: Secondary | ICD-10-CM

## 2021-05-15 DIAGNOSIS — J9811 Atelectasis: Secondary | ICD-10-CM | POA: Diagnosis not present

## 2021-05-15 DIAGNOSIS — G4700 Insomnia, unspecified: Secondary | ICD-10-CM | POA: Diagnosis not present

## 2021-05-15 DIAGNOSIS — I129 Hypertensive chronic kidney disease with stage 1 through stage 4 chronic kidney disease, or unspecified chronic kidney disease: Secondary | ICD-10-CM | POA: Diagnosis present

## 2021-05-15 DIAGNOSIS — R310 Gross hematuria: Secondary | ICD-10-CM | POA: Insufficient documentation

## 2021-05-15 DIAGNOSIS — N281 Cyst of kidney, acquired: Secondary | ICD-10-CM | POA: Diagnosis not present

## 2021-05-15 DIAGNOSIS — R112 Nausea with vomiting, unspecified: Secondary | ICD-10-CM | POA: Diagnosis not present

## 2021-05-15 DIAGNOSIS — J432 Centrilobular emphysema: Secondary | ICD-10-CM | POA: Diagnosis present

## 2021-05-15 DIAGNOSIS — Z9079 Acquired absence of other genital organ(s): Secondary | ICD-10-CM

## 2021-05-15 DIAGNOSIS — R9431 Abnormal electrocardiogram [ECG] [EKG]: Secondary | ICD-10-CM | POA: Diagnosis not present

## 2021-05-15 DIAGNOSIS — R079 Chest pain, unspecified: Secondary | ICD-10-CM | POA: Diagnosis not present

## 2021-05-15 DIAGNOSIS — M351 Other overlap syndromes: Secondary | ICD-10-CM | POA: Diagnosis present

## 2021-05-15 DIAGNOSIS — N400 Enlarged prostate without lower urinary tract symptoms: Secondary | ICD-10-CM | POA: Diagnosis not present

## 2021-05-15 DIAGNOSIS — R319 Hematuria, unspecified: Secondary | ICD-10-CM

## 2021-05-15 DIAGNOSIS — K5900 Constipation, unspecified: Secondary | ICD-10-CM | POA: Diagnosis not present

## 2021-05-15 DIAGNOSIS — Z9221 Personal history of antineoplastic chemotherapy: Secondary | ICD-10-CM | POA: Diagnosis not present

## 2021-05-15 DIAGNOSIS — C679 Malignant neoplasm of bladder, unspecified: Secondary | ICD-10-CM

## 2021-05-15 DIAGNOSIS — Z0389 Encounter for observation for other suspected diseases and conditions ruled out: Secondary | ICD-10-CM | POA: Diagnosis not present

## 2021-05-15 DIAGNOSIS — I2699 Other pulmonary embolism without acute cor pulmonale: Secondary | ICD-10-CM | POA: Diagnosis not present

## 2021-05-15 LAB — COMPREHENSIVE METABOLIC PANEL
ALT: 10 U/L (ref 0–44)
AST: 15 U/L (ref 15–41)
Albumin: 4.3 g/dL (ref 3.5–5.0)
Alkaline Phosphatase: 52 U/L (ref 38–126)
Anion gap: 9 (ref 5–15)
BUN: 19 mg/dL (ref 8–23)
CO2: 27 mmol/L (ref 22–32)
Calcium: 10.4 mg/dL — ABNORMAL HIGH (ref 8.9–10.3)
Chloride: 104 mmol/L (ref 98–111)
Creatinine, Ser: 1.36 mg/dL — ABNORMAL HIGH (ref 0.61–1.24)
GFR, Estimated: 56 mL/min — ABNORMAL LOW (ref >60)
Glucose, Bld: 95 mg/dL (ref 70–99)
Potassium: 4.1 mmol/L (ref 3.5–5.1)
Sodium: 140 mmol/L (ref 135–145)
Total Bilirubin: 0.7 mg/dL (ref 0.3–1.2)
Total Protein: 7.7 g/dL (ref 6.5–8.1)

## 2021-05-15 LAB — CBC WITH DIFFERENTIAL/PLATELET
Abs Immature Granulocytes: 0.05 10*3/uL (ref 0.00–0.07)
Basophils Absolute: 0.1 10*3/uL (ref 0.0–0.1)
Basophils Relative: 1 %
Eosinophils Absolute: 0.4 10*3/uL (ref 0.0–0.5)
Eosinophils Relative: 5 %
HCT: 41.9 % (ref 39.0–52.0)
Hemoglobin: 13.9 g/dL (ref 13.0–17.0)
Immature Granulocytes: 1 %
Lymphocytes Relative: 23 %
Lymphs Abs: 1.8 10*3/uL (ref 0.7–4.0)
MCH: 29 pg (ref 26.0–34.0)
MCHC: 33.2 g/dL (ref 30.0–36.0)
MCV: 87.3 fL (ref 80.0–100.0)
Monocytes Absolute: 0.8 10*3/uL (ref 0.1–1.0)
Monocytes Relative: 10 %
Neutro Abs: 4.8 10*3/uL (ref 1.7–7.7)
Neutrophils Relative %: 60 %
Platelets: 291 10*3/uL (ref 150–400)
RBC: 4.8 MIL/uL (ref 4.22–5.81)
RDW: 13 % (ref 11.5–15.5)
WBC: 7.9 10*3/uL (ref 4.0–10.5)
nRBC: 0 % (ref 0.0–0.2)

## 2021-05-15 LAB — BRAIN NATRIURETIC PEPTIDE: B Natriuretic Peptide: 101.3 pg/mL — ABNORMAL HIGH (ref 0.0–100.0)

## 2021-05-15 LAB — HEPARIN LEVEL (UNFRACTIONATED): Heparin Unfractionated: 0.62 IU/mL (ref 0.30–0.70)

## 2021-05-15 LAB — LIPASE, BLOOD: Lipase: 61 U/L — ABNORMAL HIGH (ref 11–51)

## 2021-05-15 LAB — TROPONIN I (HIGH SENSITIVITY): Troponin I (High Sensitivity): 4 ng/L (ref <18)

## 2021-05-15 MED ORDER — POLYETHYLENE GLYCOL 3350 17 G PO PACK
17.0000 g | PACK | Freq: Once | ORAL | Status: AC
  Administered 2021-05-15: 17 g via ORAL
  Filled 2021-05-15: qty 1

## 2021-05-15 MED ORDER — HEPARIN BOLUS VIA INFUSION: 4500.0000 [IU] | Freq: Once | INTRAVENOUS | Status: DC

## 2021-05-15 MED ORDER — LOSARTAN POTASSIUM 50 MG PO TABS
50.0000 mg | ORAL_TABLET | Freq: Every day | ORAL | Status: DC
  Administered 2021-05-16 – 2021-05-19 (×4): 50 mg via ORAL
  Filled 2021-05-15 (×4): qty 1

## 2021-05-15 MED ORDER — ATORVASTATIN CALCIUM 20 MG PO TABS
20.0000 mg | ORAL_TABLET | Freq: Every day | ORAL | Status: DC
  Administered 2021-05-16 – 2021-05-20 (×5): 20 mg via ORAL
  Filled 2021-05-15 (×5): qty 1

## 2021-05-15 MED ORDER — ALBUTEROL SULFATE (2.5 MG/3ML) 0.083% IN NEBU
2.5000 mg | INHALATION_SOLUTION | Freq: Three times a day (TID) | RESPIRATORY_TRACT | Status: DC
  Administered 2021-05-16 – 2021-05-20 (×11): 2.5 mg via RESPIRATORY_TRACT
  Filled 2021-05-15 (×13): qty 3

## 2021-05-15 MED ORDER — AMLODIPINE BESYLATE 5 MG PO TABS
7.5000 mg | ORAL_TABLET | Freq: Every day | ORAL | Status: DC
  Administered 2021-05-16 – 2021-05-19 (×4): 7.5 mg via ORAL
  Filled 2021-05-15 (×4): qty 2

## 2021-05-15 MED ORDER — MOMETASONE FURO-FORMOTEROL FUM 200-5 MCG/ACT IN AERO
2.0000 | INHALATION_SPRAY | Freq: Two times a day (BID) | RESPIRATORY_TRACT | Status: DC
  Administered 2021-05-16 – 2021-05-20 (×9): 2 via RESPIRATORY_TRACT
  Filled 2021-05-15: qty 8.8

## 2021-05-15 MED ORDER — TIOTROPIUM BROMIDE MONOHYDRATE 2.5 MCG/ACT IN AERS: 2.0000 | INHALATION_SPRAY | Freq: Every day | RESPIRATORY_TRACT | Status: DC

## 2021-05-15 MED ORDER — ALBUTEROL SULFATE (2.5 MG/3ML) 0.083% IN NEBU
2.5000 mg | INHALATION_SOLUTION | Freq: Four times a day (QID) | RESPIRATORY_TRACT | Status: DC | PRN
  Administered 2021-05-15 – 2021-05-19 (×3): 2.5 mg via RESPIRATORY_TRACT
  Filled 2021-05-15: qty 3

## 2021-05-15 MED ORDER — ONDANSETRON HCL 4 MG/2ML IJ SOLN
4.0000 mg | Freq: Three times a day (TID) | INTRAMUSCULAR | Status: DC | PRN
  Administered 2021-05-16 – 2021-05-17 (×2): 4 mg via INTRAVENOUS
  Filled 2021-05-15 (×2): qty 2

## 2021-05-15 MED ORDER — HEPARIN (PORCINE) 25000 UT/250ML-% IV SOLN
1050.0000 [IU]/h | INTRAVENOUS | Status: DC
Start: 2021-05-15 — End: 2021-05-19
  Administered 2021-05-15: 1250 [IU]/h via INTRAVENOUS
  Administered 2021-05-16 – 2021-05-17 (×2): 1100 [IU]/h via INTRAVENOUS
  Administered 2021-05-18: 1050 [IU]/h via INTRAVENOUS
  Filled 2021-05-15 (×6): qty 250

## 2021-05-15 MED ORDER — ONDANSETRON HCL 4 MG/2ML IJ SOLN
4.0000 mg | Freq: Once | INTRAMUSCULAR | Status: AC
  Administered 2021-05-15: 4 mg via INTRAVENOUS
  Filled 2021-05-15: qty 2

## 2021-05-15 MED ORDER — UMECLIDINIUM BROMIDE 62.5 MCG/ACT IN AEPB
1.0000 | INHALATION_SPRAY | Freq: Every day | RESPIRATORY_TRACT | Status: DC
  Administered 2021-05-16 – 2021-05-20 (×5): 1 via RESPIRATORY_TRACT
  Filled 2021-05-15: qty 7

## 2021-05-15 MED ORDER — CEPHALEXIN 500 MG PO CAPS
500.0000 mg | ORAL_CAPSULE | Freq: Every day | ORAL | Status: AC
  Administered 2021-05-15 – 2021-05-18 (×4): 500 mg via ORAL
  Filled 2021-05-15 (×4): qty 1

## 2021-05-15 MED ORDER — IOHEXOL 350 MG/ML SOLN
100.0000 mL | Freq: Once | INTRAVENOUS | Status: AC | PRN
  Administered 2021-05-15: 75 mL via INTRAVENOUS

## 2021-05-15 NOTE — Assessment & Plan Note (Addendum)
Atypical chest pain with cough likely from PE.  He also have tenderness to palpation suggesting musculoskeletal etiology.  CXR without acute finding.  ACS work-up negative. ?-Tylenol and oxycodone as needed ?

## 2021-05-15 NOTE — Assessment & Plan Note (Addendum)
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 Had fever with BCG and recurrence after intravesical mitomycin C.   ?-Urology planning referral to Dr. Alen Blew to consider options on discharge. ?-Continue home p.o. Keflex ?

## 2021-05-15 NOTE — Assessment & Plan Note (Addendum)
CT with segmental to subsegmental embolus in the RUL, RLL and LLL. PESI class IV, high risk (4-11.4% 30 day mortality) based on age, male, hx cancer, hx lung disease. Has infrarenal IVC filter in place (placed ~3 years ago in [place] when he couldn't tolerate anticoagulation for PE's due to hematuria with bladder cancer). Admitting provider discussed with vascular surgery.  Per vascular surgery, IVC in the right place.  Vascular surgery recommended UE Doppler, which is negative.  TTE without significant finding.  Stable on room air.  Prolonged hospitalization due to gross hematuria on anticoagulation.  Eventually, hematuria improved.  He was transitioned to Eliquis and discharged on Eliquis. ?

## 2021-05-15 NOTE — Assessment & Plan Note (Addendum)
S/p limited TURP on 4/21. Per urology, this was a limited TURP as he had a prior UroLift and TURP.  ?CT without acute findings, large diverticulum of posterior bladder dome containing small air fluid level presumably due to recent catheterization ?-Per urology. ?

## 2021-05-15 NOTE — Progress Notes (Addendum)
ANTICOAGULATION CONSULT NOTE - Initial Consult ? ?Pharmacy Consult for heparin ?Indication: pulmonary embolus ? ?No Known Allergies ? ?Patient Measurements: ?Height: '5\' 6"'$  (167.6 cm) ?Weight: 76.4 kg (168 lb 6.9 oz) ?IBW/kg (Calculated) : 63.8 ?Heparin Dosing Weight: 76.4kg ? ?Vital Signs: ?Temp: 98.8 ?F (37.1 ?C) (04/25 1050) ?BP: 126/95 (04/25 1401) ?Pulse Rate: 53 (04/25 1401) ? ?Labs: ?Recent Labs  ?  05/15/21 ?1105  ?HGB 13.9  ?HCT 41.9  ?PLT 291  ?CREATININE 1.36*  ?TROPONINIHS 4  ? ? ?Estimated Creatinine Clearance: 45 mL/min (A) (by C-G formula based on SCr of 1.36 mg/dL (H)). ? ? ?Medical History: ?Past Medical History:  ?Diagnosis Date  ? Arthritis   ? Asthma-COPD overlap syndrome ([place])   ? pulmonology--- dr n desai;   last exacerbation 05-08-2021  ? Benign localized prostatic hyperplasia with lower urinary tract symptoms (LUTS)   ? Chronic cough   ? due to copd  ? Dairy product intolerance   ? DOE (dyspnea on exertion)   ? d/t asthma/ COPD  ? Emphysema, unspecified ([place])   ? Full dentures   ? History of bladder cancer 2020  ? s/p TURBT w/ chemo instillation  ? History of COVID-19   ? per pt 2021 (no at same time of pneumonia)  w/ moderate symptoms , recovered at home, that resolved back to baseline  ? History of hepatitis C 2018  ? hx chronic hepatitis C,  per pt treated and told was cured  ? History of latent tuberculosis 2017  ? pt tested positive for TB exposure, not active, treated w/ INH for 9 months  ? History of MAI infection 2021  ? never treated  ? History of pulmonary embolism   ? (05-09-2021  pt stated never had clot prior to 2017 , none since 2020, had negative blood disorder work-up)//  2017  treated w/ xarelto 6 months//  and recurrent 04/ 2020 in setting severe UTI and deconditioning w/ prolonged admission due to intolerant to anticoagulants IVC filter inserted  ? Hypertension   ? OSA (obstructive sleep apnea)   ? per pt dx yrs ago, intolerant to cpap  ? 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 S/P  IVC filter 2020  ? for PE  ? Weak urinary stream   ? Wears glasses   ? ? ?Medications:  ?Infusions:  ? heparin    ? ? ?Assessment: ?72 yom presented to the ED with CP. Found to have a PE. To start IV heparin. Pt with history of PE but not currently anticoagulated. Baseline CBC is WNL.  ? ?Goal of Therapy:  ?Heparin level 0.3-0.7 units/ml ?Monitor platelets by anticoagulation protocol: Yes ?  ?Plan:  ?Heparin bolus 4500 units IV x 1 ?Heparin gtt 1250 units/hr ?Check an 8 hr heparin level ?Daily heparin level and CBC ? ?Retha Bither, Rande Lawman ?05/15/2021,2:05 PM ? ?Addendum: ?Due to recent urologic procedure, will DC bolus until after discussion with urology. Will add back if urology approves.  ? ?Salome Arnt, PharmD, BCPS ?Clinical Pharmacist ?Please see AMION for all pharmacy numbers ?05/15/2021 2:21 PM ? ? ?

## 2021-05-15 NOTE — Assessment & Plan Note (Addendum)
CT without acute findings.  Resolved. ?

## 2021-05-15 NOTE — Assessment & Plan Note (Signed)
Placed about 3 years ago in [place] due to hematuria in setting of bladder cancer with history of blood clots ?Should follow up with vascular outpatient ?

## 2021-05-15 NOTE — ED Notes (Signed)
Patient transported to Ultrasound 

## 2021-05-15 NOTE — ED Notes (Signed)
Report given to eric RN @ [facility] ?

## 2021-05-15 NOTE — Consult Note (Signed)
I have been asked to see the patient by Dr. Fayrene Helper, for evaluation and management of bilateral pulmonary embolus following a TURP. ? ?History of present illness: ?71 year old male status post TURP on 05/11/2021, performed as an outpatient, who presented to the urgent care facility with chest pain.  He was found to have bilateral pulmonary emboli.  He was transferred to [facility] for further evaluation and initiation of anticoagulation.  The patient's Foley catheter was removed early this morning, by the patient.  Prior to removing the catheter the urine was noted to be yellow.  The patient is now resting comfortably in the hospital bed on a heparin drip, voiding on his own.  His last void was yellow.  The patient denies any worsening dysuria.  He states he had some initial dysuria once the catheter was removed but this is improving.  He denies any significant urgency or frequency.  His stream is relatively strong.  Denies any fevers or chills.  Denies any associated pain. ? ?Review of systems: A 12 point comprehensive review of systems was obtained and is negative unless otherwise stated in the history of present illness. ? ?Patient Active Problem List  ? Diagnosis Date Noted  ? Bilateral pulmonary embolism ([place]) 05/15/2021  ? Pulmonary embolism ([place]) 05/15/2021  ? Asthma with COPD with exacerbation ([place]) 04/13/2021  ? Eosinophilia 04/13/2021  ? Hypertension 04/13/2021  ? COPD (chronic obstructive pulmonary disease) ([place]) 01/02/2021  ? ? ?No current facility-administered medications on file prior to encounter.  ? ?Current Outpatient Medications on File Prior to Encounter  ?Medication Sig Dispense Refill  ? albuterol (PROVENTIL) (2.5 MG/3ML) 0.083% nebulizer solution Take 3 mLs (2.5 mg total) by nebulization every 6 (six) hours as needed for wheezing or shortness of breath. 225 mL 3  ? amLODipine (NORVASC) 5 MG tablet Take 7.5 mg by mouth daily.    ? atorvastatin (LIPITOR) 20 MG tablet Take 20 mg by mouth  daily.    ? cephALEXin (KEFLEX) 500 MG capsule Take 1 capsule (500 mg total) by mouth at bedtime. 7 capsule 0  ? [START ON 06/07/2021] Dupilumab (DUPIXENT) 300 MG/2ML SOPN Inject 300 mg into the skin every 14 (fourteen) days. 12 mL 1  ? fluticasone-salmeterol (ADVAIR HFA) 230-21 MCG/ACT inhaler Inhale 2 puffs into the lungs 2 (two) times daily. (Patient taking differently: Inhale 2 puffs into the lungs 2 (two) times daily.) 1 each 12  ? guaiFENesin (MUCINEX) 600 MG 12 hr tablet Take 1 tablet (600 mg total) by mouth 2 (two) times daily. (Patient taking differently: Take 600 mg by mouth 2 (two) times daily.) 60 tablet 1  ? ibuprofen (ADVIL) 400 MG tablet Take 400 mg by mouth every 6 (six) hours as needed.    ? losartan (COZAAR) 50 MG tablet Take 50 mg by mouth daily.    ? sodium chloride 0.9 % nebulizer solution Take 3 mLs by nebulization 3 (three) times daily as needed for wheezing. 90 mL 12  ? Tiotropium Bromide Monohydrate (SPIRIVA RESPIMAT) 2.5 MCG/ACT AERS Inhale 2 puffs into the lungs daily. 4 g 5  ? albuterol (VENTOLIN HFA) 108 (90 Base) MCG/ACT inhaler Inhale 2 puffs into the lungs every 6 (six) hours as needed for wheezing or shortness of breath. (Patient taking differently: Inhale 2 puffs into the lungs every 6 (six) hours as needed for wheezing or shortness of breath.) 1 each 5  ? Misc. Devices MISC Please dispense 1 flutter valve for use after inhalers 1 each 0  ? ? ?  Past Medical History:  ?Diagnosis Date  ? Arthritis   ? Asthma-COPD overlap syndrome ([place])   ? pulmonology--- dr n desai;   last exacerbation 05-08-2021  ? Benign localized prostatic hyperplasia with lower urinary tract symptoms (LUTS)   ? Chronic cough   ? due to copd  ? Dairy product intolerance   ? DOE (dyspnea on exertion)   ? d/t asthma/ COPD  ? Emphysema, unspecified ([place])   ? Full dentures   ? History of bladder cancer 2020  ? s/p TURBT w/ chemo instillation  ? History of COVID-19   ? per pt 2021 (no at same time of pneumonia)  w/  moderate symptoms , recovered at home, that resolved back to baseline  ? History of hepatitis C 2018  ? hx chronic hepatitis C,  per pt treated and told was cured  ? History of latent tuberculosis 2017  ? pt tested positive for TB exposure, not active, treated w/ INH for 9 months  ? History of MAI infection 2021  ? never treated  ? History of pulmonary embolism   ? (05-09-2021  pt stated never had clot prior to 2017 , none since 2020, had negative blood disorder work-up)//  2017  treated w/ xarelto 6 months//  and recurrent 04/ 2020 in setting severe UTI and deconditioning w/ prolonged admission due to intolerant to anticoagulants IVC filter inserted  ? Hypertension   ? OSA (obstructive sleep apnea)   ? per pt dx yrs ago, intolerant to cpap  ? 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 S/P IVC filter 2020  ? for PE  ? Weak urinary stream   ? Wears glasses   ? ? ?Past Surgical History:  ?Procedure Laterality Date  ? ANAL FISTULECTOMY    ? x5  last one 2008  ? BRONCHOSCOPY  2021  ? per pt had pneumonia   (done in [place])  ? INGUINAL HERNIA REPAIR Right 2010  ? IVC FILTER INSERTION  2020  ? in [place]  ? ORIF WRIST FRACTURE Left 2016  ? SHOULDER ARTHROSCOPY W/ ROTATOR CUFF REPAIR Left 2016  ? TRANSURETHRAL RESECTION OF BLADDER TUMOR  2020  ? in [place]  ? TRANSURETHRAL RESECTION OF PROSTATE  2021  ? in [place]  ? TRANSURETHRAL RESECTION OF PROSTATE N/A 05/11/2021  ? Procedure: PROSTATE EXAM UNDER ANESTHESIA; TRANSURETHRAL RESECTION OF THE PROSTATE (TURP), BLADDER BIOPSY;  Surgeon: Festus Aloe, MD;  Location: [facility];  Service: Urology;  Laterality: N/A;  ? UMBILICAL HERNIA REPAIR  2011  ? ? ?Social History  ? ?Tobacco Use  ? Smoking status: Former  ?  Packs/day: 1.00  ?  Years: 20.00  ?  Pack years: 20.00  ?  Types: Cigarettes  ?  Quit date: 68  ?  Years since quitting: 31.3  ? Smokeless tobacco: Never  ?Vaping Use  ? Vaping Use: Never used  ?Substance Use Topics  ? Alcohol use: Not Currently  ? Drug use: Yes  ?   Types: Marijuana  ?  Comment: 05-09-2021  per pt smokes occasional  ? ? ?Family History  ?Problem Relation Age of Onset  ? Pulmonary embolism Mother   ? Emphysema Father   ? Lung cancer Sister   ? Asthma Child   ? Asthma Child   ? ? ?PE: ?Vitals:  ? 05/15/21 1401 05/15/21 1455 05/15/21 1500 05/15/21 1627  ?BP: (!) 126/95 (!) 125/91 140/85 (!) 143/87  ?Pulse: (!) 53 (!) 50 (!) 55 (!) 51  ?Resp: 15  16 (!) 21 20  ?Temp:   98 ?F (36.7 ?C) 97.7 ?F (36.5 ?C)  ?TempSrc:    Oral  ?SpO2: 96% 97% 97% 100%  ?Weight:    76.4 kg  ?Height:    '5\' 6"'$  (1.676 m)  ? ?Patient appears to be in no acute distress  ?patient is alert and oriented x3 ?Atraumatic normocephalic head ?No cervical or supraclavicular lymphadenopathy appreciated ?No increased work of breathing,  audible wheezes ?Regular sinus rhythm/rate ?Abdomen is soft, nontender, nondistended, no CVA or suprapubic tenderness ?Lower extremities are symmetric without appreciable edema ?Grossly neurologically intact ?No identifiable skin lesions ? ?Recent Labs  ?  05/15/21 ?1105  ?WBC 7.9  ?HGB 13.9  ?HCT 41.9  ? ?Recent Labs  ?  05/15/21 ?1105  ?NA 140  ?K 4.1  ?CL 104  ?CO2 27  ?GLUCOSE 95  ?BUN 19  ?CREATININE 1.36*  ?CALCIUM 10.4*  ? ?No results for input(s): LABPT, INR in the last 72 hours. ?No results for input(s): LABURIN in the last 72 hours. ?Results for orders placed or performed during the hospital encounter of 05/11/21  ?Resp Panel by RT-PCR (Flu A&B, Covid) Nasopharyngeal Swab     Status: None  ? Collection Time: 05/09/21  1:09 PM  ? Specimen: Nasopharyngeal Swab; Nasopharyngeal(NP) swabs in vial transport medium  ?Result Value Ref Range Status  ? SARS Coronavirus 2 by RT PCR NEGATIVE NEGATIVE Final  ?  Comment: (NOTE) ?SARS-CoV-2 target nucleic acids are NOT DETECTED. ? ?The SARS-CoV-2 RNA is generally detectable in upper respiratory ?specimens during the acute phase of infection. The lowest ?concentration of SARS-CoV-2 viral copies this assay can detect is ?138  copies/mL. A negative result does not preclude SARS-Cov-2 ?infection and should not be used as the sole basis for treatment or ?other patient management decisions. A negative result may occur with  ?improper specime

## 2021-05-15 NOTE — ED Notes (Signed)
Report given to [place] with [facility] ?

## 2021-05-15 NOTE — Plan of Care (Signed)
Report received from [place], Therapist, sports. Skin assessment complete. Admission complete. Admitting services paged. ? ? ?Problem: Education: ?Goal: Knowledge of General Education information will improve ?Description: Including pain rating scale, medication(s)/side effects and non-pharmacologic comfort measures ?Outcome: Progressing ?  ?Problem: Health Behavior/Discharge Planning: ?Goal: Ability to manage health-related needs will improve ?Outcome: Progressing ?  ?Problem: Clinical Measurements: ?Goal: Ability to maintain clinical measurements within normal limits will improve ?Outcome: Progressing ?Goal: Will remain free from infection ?Outcome: Progressing ?Goal: Diagnostic test results will improve ?Outcome: Progressing ?Goal: Respiratory complications will improve ?Outcome: Progressing ?Goal: Cardiovascular complication will be avoided ?Outcome: Progressing ?  ?Problem: Activity: ?Goal: Risk for activity intolerance will decrease ?Outcome: Progressing ?  ?Problem: Coping: ?Goal: Level of anxiety will decrease ?Outcome: Progressing ?  ?Problem: Elimination: ?Goal: Will not experience complications related to bowel motility ?Outcome: Progressing ?Goal: Will not experience complications related to urinary retention ?Outcome: Progressing ?  ?Problem: Pain Managment: ?Goal: General experience of comfort will improve ?Outcome: Progressing ?  ?Problem: Safety: ?Goal: Ability to remain free from injury will improve ?Outcome: Progressing ?  ?

## 2021-05-15 NOTE — ED Triage Notes (Signed)
Pt. States he is having complications from surgery on Friday. Complains of chest pain , feet swelling, nausea, vomited x2 on Saturday. Had Tele visit and was advised to come in. Pain is 6/10. Chest pain comes and goes with pressure.  ?

## 2021-05-15 NOTE — H&P (Signed)
?History and Physical  ? ? ?James Barker DOB: Jan 15, 1951 DOA: 05/15/2021 ? ?PCP: Lois Huxley, PA  ?Patient coming from: home ? ?I have personally briefly reviewed patient's old medical records in [place] ? ?Chief Complaint: general malaise, nausea, vomiting, lower extremity swelling ? ?HPI: James Barker is James Barker 71 y.o. male with medical history significant of asthma/COPD overlap syndrome, hx bladder cancer s/p TURBT with chemo instillation (in [place]), s/p IVC, hx VTE and multiple other medical issues here after recent cystoscopy with bladder biopsy and fulguration 0.5 cm and transurethral resection of prostate with general malaise and feeling unwell with nausea/vomiting, chest pain, and lower extremity swelling. ? ?He notes since surgery on 4/21, things have just gone downhill.  He notes having the foley catheter was irritating.  Then adds, that he's also had nausea, vomiting, chest pain, and swelling in his legs.   He's had poor appetite, but this has been feeling better.  He was discussing with nurse over the phone today after he'd removed his foley and due to his symptoms, they recommended he come to the hospital.  ? ?Denies smoking or drinking.   ? ?ED Course: Labs, imaging, admit for PE.  ? ?Review of Systems: As per HPI otherwise all other systems reviewed and are negative. ? ?Past Medical History:  ?Diagnosis Date  ? Arthritis   ? Asthma-COPD overlap syndrome ([place])   ? pulmonology--- dr n desai;   last exacerbation 05-08-2021  ? Benign localized prostatic hyperplasia with lower urinary tract symptoms (LUTS)   ? Chronic cough   ? due to copd  ? Dairy product intolerance   ? DOE (dyspnea on exertion)   ? d/t asthma/ COPD  ? Emphysema, unspecified ([place])   ? Full dentures   ? History of bladder cancer 2020  ? s/p TURBT w/ chemo instillation  ? History of COVID-19   ? per pt 2021 (no at same time of pneumonia)  w/ moderate symptoms , recovered at home, that resolved back to  baseline  ? History of hepatitis C 2018  ? hx chronic hepatitis C,  per pt treated and told was cured  ? History of latent tuberculosis 2017  ? pt tested positive for TB exposure, not active, treated w/ INH for 9 months  ? History of MAI infection 2021  ? never treated  ? History of pulmonary embolism   ? (05-09-2021  pt stated never had clot prior to 2017 , none since 2020, had negative blood disorder work-up)//  2017  treated w/ xarelto 6 months//  and recurrent 04/ 2020 in setting severe UTI and deconditioning w/ prolonged admission due to intolerant to anticoagulants IVC filter inserted  ? Hypertension   ? OSA (obstructive sleep apnea)   ? per pt dx yrs ago, intolerant to cpap  ? 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 S/P IVC filter 2020  ? for PE  ? Weak urinary stream   ? Wears glasses   ? ? ?Past Surgical History:  ?Procedure Laterality Date  ? ANAL FISTULECTOMY    ? x5  last one 2008  ? BRONCHOSCOPY  2021  ? per pt had pneumonia   (done in [place])  ? INGUINAL HERNIA REPAIR Right 2010  ? IVC FILTER INSERTION  2020  ? in [place]  ? ORIF WRIST FRACTURE Left 2016  ? SHOULDER ARTHROSCOPY W/ ROTATOR CUFF REPAIR Left 2016  ? TRANSURETHRAL RESECTION OF BLADDER TUMOR  2020  ? in [place]  ?  TRANSURETHRAL RESECTION OF PROSTATE  2021  ? in [place]  ? TRANSURETHRAL RESECTION OF PROSTATE N/James Barker 05/11/2021  ? Procedure: PROSTATE EXAM UNDER ANESTHESIA; TRANSURETHRAL RESECTION OF THE PROSTATE (TURP), BLADDER BIOPSY;  Surgeon: Festus Aloe, MD;  Location: [facility];  Service: Urology;  Laterality: N/James Barker;  ? UMBILICAL HERNIA REPAIR  2011  ? ? ?Social History ? reports that he quit smoking about 31 years ago. His smoking use included cigarettes. He has James Barker 20.00 pack-year smoking history. He has never used smokeless tobacco. He reports that he does not currently use alcohol. He reports current drug use. Drug: Marijuana. ? ?No Known Allergies ? ?Family History  ?Problem Relation Age of Onset  ? Pulmonary embolism Mother   ? Emphysema  Father   ? Lung cancer Sister   ? Asthma Child   ? Asthma Child   ? ? ?Prior to Admission medications   ?Medication Sig Start Date End Date Taking? Authorizing Provider  ?albuterol (PROVENTIL) (2.5 MG/3ML) 0.083% nebulizer solution Take 3 mLs (2.5 mg total) by nebulization every 6 (six) hours as needed for wheezing or shortness of breath. 04/26/21  Yes Cobb, Karie Schwalbe, NP  ?amLODipine (NORVASC) 5 MG tablet Take 7.5 mg by mouth daily. 11/06/20  Yes [provider]  ?atorvastatin (LIPITOR) 20 MG tablet Take 20 mg by mouth daily. 12/07/20  Yes [provider]  ?cephALEXin (KEFLEX) 500 MG capsule Take 1 capsule (500 mg total) by mouth at bedtime. 05/11/21  Yes Festus Aloe, MD  ?Dupilumab (DUPIXENT) 300 MG/2ML SOPN Inject 300 mg into the skin every 14 (fourteen) days. 06/07/21  Yes Cobb, Karie Schwalbe, NP  ?fluticasone-salmeterol (ADVAIR HFA) 230-21 MCG/ACT inhaler Inhale 2 puffs into the lungs 2 (two) times daily. ?Patient taking differently: Inhale 2 puffs into the lungs 2 (two) times daily. 12/22/20  Yes Spero Geralds, MD  ?guaiFENesin (MUCINEX) 600 MG 12 hr tablet Take 1 tablet (600 mg total) by mouth 2 (two) times daily. ?Patient taking differently: Take 600 mg by mouth 2 (two) times daily. 04/13/21  Yes Cobb, Karie Schwalbe, NP  ?ibuprofen (ADVIL) 400 MG tablet Take 400 mg by mouth every 6 (six) hours as needed.   Yes [provider]  ?losartan (COZAAR) 50 MG tablet Take 50 mg by mouth daily. 12/12/20  Yes [provider]  ?sodium chloride 0.9 % nebulizer solution Take 3 mLs by nebulization 3 (three) times daily as needed for wheezing. 05/08/21  Yes Spero Geralds, MD  ?Tiotropium Bromide Monohydrate (SPIRIVA RESPIMAT) 2.5 MCG/ACT AERS Inhale 2 puffs into the lungs daily. 04/13/21  Yes Cobb, Karie Schwalbe, NP  ?albuterol (VENTOLIN HFA) 108 (90 Base) MCG/ACT inhaler Inhale 2 puffs into the lungs every 6 (six) hours as needed for wheezing or shortness of breath. ?Patient taking  differently: Inhale 2 puffs into the lungs every 6 (six) hours as needed for wheezing or shortness of breath. 12/22/20   Spero Geralds, MD  ?Misc. Devices MISC Please dispense 1 flutter valve for use after inhalers 02/20/21   Spero Geralds, MD  ? ? ?Physical Exam: ?Vitals:  ? 05/15/21 1401 05/15/21 1455 05/15/21 1500 05/15/21 1627  ?BP: (!) 126/95 (!) 125/91 140/85 (!) 143/87  ?Pulse: (!) 53 (!) 50 (!) 55 (!) 51  ?Resp: 15 16 (!) 21 20  ?Temp:   98 ?F (36.7 ?C) 97.7 ?F (36.5 ?C)  ?TempSrc:    Oral  ?SpO2: 96% 97% 97% 100%  ?Weight:    76.4 kg  ?Height:  $'5\' 6"'y$  (1.676 m)  ? ? ?Constitutional: NAD, calm, comfortable ?Vitals:  ? 05/15/21 1401 05/15/21 1455 05/15/21 1500 05/15/21 1627  ?BP: (!) 126/95 (!) 125/91 140/85 (!) 143/87  ?Pulse: (!) 53 (!) 50 (!) 55 (!) 51  ?Resp: 15 16 (!) 21 20  ?Temp:   98 ?F (36.7 ?C) 97.7 ?F (36.5 ?C)  ?TempSrc:    Oral  ?SpO2: 96% 97% 97% 100%  ?Weight:    76.4 kg  ?Height:    '5\' 6"'$  (1.676 m)  ? ?Eyes: PERRL, lids and conjunctivae normal ?ENMT: Mucous membranes are moist.  ?Neck: normal, supple ?Respiratory: Unlabored. Normal respiratory effort. No accessory muscle use.  ?Cardiovascular: Regular rate and rhythm,.  No LEE edema. ?Abdomen: no tenderness, no masses palpated.  ?Musculoskeletal: no clubbing / cyanosis. No joint deformity upper and lower extremities. Good ROM, no contractures. Normal muscle tone.  ?Skin: no rashes, lesions, ulcers. No induration ?Neurologic: CN 2-12 grossly intact. Moving all extremities.  ?Psychiatric: Normal judgment and insight. Alert and oriented x 3. Normal mood.  ? ?Labs on Admission: I have personally reviewed following labs and imaging studies ? ?CBC: ?Recent Labs  ?Lab 05/11/21 ?0627 05/15/21 ?1105  ?WBC  --  7.9  ?NEUTROABS  --  4.8  ?HGB 12.6* 13.9  ?HCT 37.0* 41.9  ?MCV  --  87.3  ?PLT  --  291  ? ? ?Basic Metabolic Panel: ?Recent Labs  ?Lab 05/11/21 ?0627 05/15/21 ?1105  ?NA 141 140  ?K 4.1 4.1  ?CL 108 104  ?CO2  --  27  ?GLUCOSE 89 95  ?BUN  45* 19  ?CREATININE 1.70* 1.36*  ?CALCIUM  --  10.4*  ? ? ?GFR: ?Estimated Creatinine Clearance: 45 mL/min (Wynell Halberg) (by C-G formula based on SCr of 1.36 mg/dL (H)). ? ?Liver Function Tests: ?Recent Labs  ?Lab 05/15/21 ?11

## 2021-05-15 NOTE — Progress Notes (Signed)
Plan of Care Note for accepted transfer ? ? ?Patient: James Barker MRN: [id number]   DOA: 05/15/2021 ? ?Facility requesting transfer: [facility]. ?Requesting Provider: Lennice Sites, DO. ?Reason for transfer: Bilateral pulmonary embolism. ?Facility course:  ?71 year old male with a past medical history of COPD, asthma, eosinophilia, hypertension, hyperlipidemia, history of thromboembolic disease and IVC filter placement who underwent a TURP procedure 4 days ago and presented to the emergency department with complaints of chest pain, lower extremity edema, nausea, 2 episodes of emesis 3 days ago.  Work-up in the emergency department chest CTA with pulmonary emboli.  Heparin to be started after discussion with urology. ? ?Plan of care: ?The patient is accepted for admission to Progressive unit, at [facility]. ? ?Author: ?Reubin Milan, MD ?05/15/2021 ? ?Check [email or web address] for on-call coverage. ? ?Nursing staff, Please call [place] number on Amion as soon as patient's arrival, so appropriate admitting provider can evaluate the pt. ?

## 2021-05-15 NOTE — Progress Notes (Signed)
ANTICOAGULATION CONSULT NOTE ? ?Pharmacy Consult for heparin ?Indication: pulmonary embolus ? ?No Known Allergies ? ?Patient Measurements: ?Height: '5\' 6"'$  (167.6 cm) ?Weight: 76.4 kg (168 lb 6.9 oz) ?IBW/kg (Calculated) : 63.8 ?Heparin Dosing Weight: 76.4kg ? ?Vital Signs: ?Temp: 97.6 ?F (36.4 ?C) (04/25 2033) ?Temp Source: Oral (04/25 2033) ?BP: 128/97 (04/25 2033) ?Pulse Rate: 58 (04/25 2033) ? ?Labs: ?Recent Labs  ?  05/15/21 ?1105 05/15/21 ?2221  ?HGB 13.9  --   ?HCT 41.9  --   ?PLT 291  --   ?HEPARINUNFRC  --  0.62  ?CREATININE 1.36*  --   ?TROPONINIHS 4  --   ? ? ? ?Estimated Creatinine Clearance: 45 mL/min (A) (by C-G formula based on SCr of 1.36 mg/dL (H)). ? ? ?Medical History: ?Past Medical History:  ?Diagnosis Date  ? Arthritis   ? Asthma-COPD overlap syndrome ([place])   ? pulmonology--- dr n desai;   last exacerbation 05-08-2021  ? Benign localized prostatic hyperplasia with lower urinary tract symptoms (LUTS)   ? Chronic cough   ? due to copd  ? Dairy product intolerance   ? DOE (dyspnea on exertion)   ? d/t asthma/ COPD  ? Emphysema, unspecified ([place])   ? Full dentures   ? History of bladder cancer 2020  ? s/p TURBT w/ chemo instillation  ? History of COVID-19   ? per pt 2021 (no at same time of pneumonia)  w/ moderate symptoms , recovered at home, that resolved back to baseline  ? History of hepatitis C 2018  ? hx chronic hepatitis C,  per pt treated and told was cured  ? History of latent tuberculosis 2017  ? pt tested positive for TB exposure, not active, treated w/ INH for 9 months  ? History of MAI infection 2021  ? never treated  ? History of pulmonary embolism   ? (05-09-2021  pt stated never had clot prior to 2017 , none since 2020, had negative blood disorder work-up)//  2017  treated w/ xarelto 6 months//  and recurrent 04/ 2020 in setting severe UTI and deconditioning w/ prolonged admission due to intolerant to anticoagulants IVC filter inserted  ? Hypertension   ? OSA (obstructive sleep apnea)    ? per pt dx yrs ago, intolerant to cpap  ? Pulmonary eosinophilia ([place])   ? S/P IVC filter 2020  ? for PE  ? Weak urinary stream   ? Wears glasses   ? ? ?Medications:  ?Infusions:  ? heparin 1,250 Units/hr (05/15/21 1455)  ? ? ?Assessment: ?3 yom presented to the ED with CP. Found to have a PE. To start IV heparin. Pt with history of PE but not currently anticoagulated. Baseline CBC is WNL.  ? ?05/15/2021: ?Initial heparin level: 0.62- at foal on IV heparin 1250 units/hr ?No bleeding or infusion related issues reported by RN ? ?Goal of Therapy:  ?Heparin level 0.3-0.7 units/ml ?Monitor platelets by anticoagulation protocol: Yes ?  ?Plan:  ?Continue heparin infusion at 1250 units/hr ?Check confirmatory heparin level @ 0500 with morning labs ?Daily heparin level and CBC while on heparin ? ?Netta Cedars PharmD ?05/15/2021,11:23 PM ? ? ? ?

## 2021-05-15 NOTE — ED Provider Notes (Addendum)
?[facility] ?Provider Note ? ? ?CSN: Barker ?Arrival date & time: 05/15/21  1043 ? ?  ? ?History ? ?Chief Complaint  ?Patient presents with  ? Chest Pain  ? ? ?James Barker is a 71 y.o. male. ? ?Patient here with chest pain, upper abdominal discomfort with nausea and vomiting over the weekend.  Just had prostate procedure Friday where he had general anesthesia and went home the same day.  History of blood clot status post IVC filter.  Has Foley catheter removed this morning.  Has been able to urinate without any issues.  States he is passing gas but has not had a good bowel movement since.  He feels bloated.  Denies any shortness of breath, sputum production.  No pain on urination.  States he has noticed some swelling in his legs.  Nothing has made it better or worse.  He states he has been fairly sedentary since his procedure on Friday. ? ?The history is provided by the patient.  ?Chest Pain ? ?  ? ?Home Medications ?Prior to Admission medications   ?Medication Sig Start Date End Date Taking? Authorizing Provider  ?albuterol (PROVENTIL) (2.5 MG/3ML) 0.083% nebulizer solution Take 3 mLs (2.5 mg total) by nebulization every 6 (six) hours as needed for wheezing or shortness of breath. 04/26/21  Yes Cobb, Karie Schwalbe, NP  ?amLODipine (NORVASC) 5 MG tablet Take 7.5 mg by mouth daily. 11/06/20  Yes [provider]  ?atorvastatin (LIPITOR) 20 MG tablet Take 20 mg by mouth daily. 12/07/20  Yes [provider]  ?cephALEXin (KEFLEX) 500 MG capsule Take 1 capsule (500 mg total) by mouth at bedtime. 05/11/21  Yes Festus Aloe, MD  ?Dupilumab (DUPIXENT) 300 MG/2ML SOPN Inject 300 mg into the skin every 14 (fourteen) days. 06/07/21  Yes Cobb, Karie Schwalbe, NP  ?fluticasone-salmeterol (ADVAIR HFA) 230-21 MCG/ACT inhaler Inhale 2 puffs into the lungs 2 (two) times daily. ?Patient taking differently: Inhale 2 puffs into the lungs 2 (two) times daily. 12/22/20  Yes Spero Geralds, MD  ?guaiFENesin (MUCINEX) 600 MG 12 hr tablet Take 1 tablet (600 mg total) by mouth 2 (two) times daily. ?Patient taking differently: Take 600 mg by mouth 2 (two) times daily. 04/13/21  Yes Cobb, Karie Schwalbe, NP  ?ibuprofen (ADVIL) 400 MG tablet Take 400 mg by mouth every 6 (six) hours as needed.   Yes [provider]  ?losartan (COZAAR) 50 MG tablet Take 50 mg by mouth daily. 12/12/20  Yes [provider]  ?sodium chloride 0.9 % nebulizer solution Take 3 mLs by nebulization 3 (three) times daily as needed for wheezing. 05/08/21  Yes Spero Geralds, MD  ?Tiotropium Bromide Monohydrate (SPIRIVA RESPIMAT) 2.5 MCG/ACT AERS Inhale 2 puffs into the lungs daily. 04/13/21  Yes Cobb, Karie Schwalbe, NP  ?albuterol (VENTOLIN HFA) 108 (90 Base) MCG/ACT inhaler Inhale 2 puffs into the lungs every 6 (six) hours as needed for wheezing or shortness of breath. ?Patient taking differently: Inhale 2 puffs into the lungs every 6 (six) hours as needed for wheezing or shortness of breath. 12/22/20   Spero Geralds, MD  ?Misc. Devices MISC Please dispense 1 flutter valve for use after inhalers 02/20/21   Spero Geralds, MD  ?   ? ?Allergies    ?Patient has no known allergies.   ? ?Review of Systems   ?Review of Systems  ?Cardiovascular:  Positive for chest pain.  ? ?Physical Exam ?Updated Vital Signs ?BP (!) 126/95  Pulse (!) 53   Temp 98.8 ?F (37.1 ?C)   Resp 15   Ht '5\' 6"'$  (1.676 m)   Wt 76.4 kg   SpO2 96%   BMI 27.19 kg/m?  ?Physical Exam ?Vitals and nursing note reviewed.  ?Constitutional:   ?   General: He is not in acute distress. ?   Appearance: He is well-developed. He is not ill-appearing.  ?HENT:  ?   Head: Normocephalic and atraumatic.  ?Eyes:  ?   Extraocular Movements: Extraocular movements intact.  ?   Conjunctiva/sclera: Conjunctivae normal.  ?   Pupils: Pupils are equal, round, and reactive to light.  ?Cardiovascular:  ?   Rate and Rhythm: Normal rate and regular rhythm.  ?   Pulses:     ?      Radial pulses are 2+ on the right side and 2+ on the left side.  ?   Heart sounds: Normal heart sounds. No murmur heard. ?Pulmonary:  ?   Effort: Pulmonary effort is normal. No respiratory distress.  ?   Breath sounds: Normal breath sounds. No decreased breath sounds, wheezing or rhonchi.  ?Abdominal:  ?   Palpations: Abdomen is soft.  ?   Tenderness: There is no abdominal tenderness.  ?   Comments: Some tenderness in the epigastric region, abdomen is mildly distended but soft  ?Musculoskeletal:     ?   General: No swelling.  ?   Cervical back: Normal range of motion and neck supple.  ?   Right lower leg: Edema present.  ?   Left lower leg: Edema present.  ?   Comments: 1+ pitting edema bilaterally  ?Skin: ?   General: Skin is warm and dry.  ?   Capillary Refill: Capillary refill takes less than 2 seconds.  ?Neurological:  ?   General: No focal deficit present.  ?   Mental Status: He is alert.  ?Psychiatric:     ?   Mood and Affect: Mood normal.  ? ? ?ED Results / Procedures / Treatments   ?Labs ?(all labs ordered are listed, but only abnormal results are displayed) ?Labs Reviewed  ?COMPREHENSIVE METABOLIC PANEL - Abnormal; Notable for the following components:  ?    Result Value  ? Creatinine, Ser 1.36 (*)   ? Calcium 10.4 (*)   ? GFR, Estimated 56 (*)   ? All other components within normal limits  ?BRAIN NATRIURETIC PEPTIDE - Abnormal; Notable for the following components:  ? B Natriuretic Peptide 101.3 (*)   ? All other components within normal limits  ?LIPASE, BLOOD - Abnormal; Notable for the following components:  ? Lipase 61 (*)   ? All other components within normal limits  ?CBC WITH DIFFERENTIAL/PLATELET  ?HEPARIN LEVEL (UNFRACTIONATED)  ?TROPONIN I (HIGH SENSITIVITY)  ? ? ?EKG ?EKG Interpretation ? ?Date/Time:  Tuesday May 15 2021 10:54:04 EDT ?Ventricular Rate:  55 ?PR Interval:  168 ?QRS Duration: 86 ?QT Interval:  404 ?QTC Calculation: 386 ?R Axis:   40 ?Text Interpretation: Sinus bradycardia  Otherwise normal ECG When compared with ECG of 11-May-2021 06:40, Premature ventricular complexes are no longer Present Confirmed by Lennice Sites [phone]) on 05/15/2021 10:58:52 AM ? ?Radiology ?CT Angio Chest PE W and/or Wo Contrast ? ?Result Date: 05/15/2021 ?CLINICAL DATA:  Rule out PE, status post cystoscopy, TURP, and bladder biopsy EXAM: CT ANGIOGRAPHY CHEST WITH CONTRAST TECHNIQUE: Multidetector CT imaging of the chest was performed using the standard protocol during bolus administration of intravenous contrast. Multiplanar CT image reconstructions  and MIPs were obtained to evaluate the vascular anatomy. RADIATION DOSE REDUCTION: This exam was performed according to the departmental dose-optimization program which includes automated exposure control, adjustment of the mA and/or kV according to patient size and/or use of iterative reconstruction technique. CONTRAST:  64m OMNIPAQUE IOHEXOL 350 MG/ML SOLN COMPARISON:  None. FINDINGS: Cardiovascular: Satisfactory opacification of the pulmonary arteries to the segmental level. Positive examination for pulmonary embolism, with segmental to subsegmental embolus present in the right upper lobe, right lower lobe, and left lower lobe. Normal heart size. No pericardial effusion. Coronary artery calcifications. Aortic atherosclerosis. Mediastinum/Nodes: No enlarged mediastinal, hilar, or axillary lymph nodes. Thyroid gland, trachea, and esophagus demonstrate no significant findings. Lungs/Pleura: Mild centrilobular and paraseptal emphysema. Diffuse bilateral bronchial wall thickening. Heterogeneous airspace opacity of the bilateral lung bases. No pleural effusion or pneumothorax. Upper Abdomen: Please see separately reported examination of the abdomen. Musculoskeletal: No chest wall abnormality. No acute osseous findings. Review of the MIP images confirms the above findings. IMPRESSION: 1. Positive examination for pulmonary embolism, with segmental to subsegmental embolus  present in the right upper lobe, right lower lobe, and left lower lobe. 2. Heterogeneous airspace opacity of the bilateral lung bases, consistent with infection or aspiration. 3. Diffuse bilateral bronchial wall thicke

## 2021-05-15 NOTE — Assessment & Plan Note (Addendum)
Discontinued losartan with hope of getting some renal function back ?Increased amlodipine from 7.5 to 10 mg daily ?

## 2021-05-15 NOTE — Assessment & Plan Note (Addendum)
Resolved

## 2021-05-15 NOTE — Assessment & Plan Note (Addendum)
Follows with Dr. Shearon Stalls. CT with bilateral bronchial wall thickening c/w infectious or inflammatory bronchitis  ?Continue home medications. ? ?

## 2021-05-16 ENCOUNTER — Observation Stay (HOSPITAL_BASED_OUTPATIENT_CLINIC_OR_DEPARTMENT_OTHER): Payer: Medicare Other

## 2021-05-16 DIAGNOSIS — M351 Other overlap syndromes: Secondary | ICD-10-CM

## 2021-05-16 DIAGNOSIS — I2699 Other pulmonary embolism without acute cor pulmonale: Secondary | ICD-10-CM | POA: Diagnosis not present

## 2021-05-16 DIAGNOSIS — R310 Gross hematuria: Secondary | ICD-10-CM

## 2021-05-16 DIAGNOSIS — G4700 Insomnia, unspecified: Secondary | ICD-10-CM | POA: Diagnosis not present

## 2021-05-16 DIAGNOSIS — R079 Chest pain, unspecified: Secondary | ICD-10-CM | POA: Diagnosis not present

## 2021-05-16 DIAGNOSIS — Z95828 Presence of other vascular implants and grafts: Secondary | ICD-10-CM

## 2021-05-16 DIAGNOSIS — I2694 Multiple subsegmental pulmonary emboli without acute cor pulmonale: Secondary | ICD-10-CM

## 2021-05-16 DIAGNOSIS — N400 Enlarged prostate without lower urinary tract symptoms: Secondary | ICD-10-CM | POA: Diagnosis not present

## 2021-05-16 DIAGNOSIS — K5900 Constipation, unspecified: Secondary | ICD-10-CM | POA: Diagnosis not present

## 2021-05-16 DIAGNOSIS — Z8551 Personal history of malignant neoplasm of bladder: Secondary | ICD-10-CM

## 2021-05-16 DIAGNOSIS — R319 Hematuria, unspecified: Secondary | ICD-10-CM

## 2021-05-16 DIAGNOSIS — I2693 Single subsegmental pulmonary embolism without acute cor pulmonale: Secondary | ICD-10-CM | POA: Diagnosis not present

## 2021-05-16 LAB — HEPARIN LEVEL (UNFRACTIONATED)
Heparin Unfractionated: 0.84 IU/mL — ABNORMAL HIGH (ref 0.30–0.70)
Heparin Unfractionated: 0.58 IU/mL (ref 0.30–0.70)
Heparin Unfractionated: 0.5 IU/mL (ref 0.30–0.70)

## 2021-05-16 LAB — ECHOCARDIOGRAM COMPLETE
AR max vel: 2.97 cm2
AV Area VTI: 2.88 cm2
AV Area mean vel: 2.78 cm2
AV Mean grad: 5 mmHg
AV Peak grad: 9.6 mmHg
Ao pk vel: 1.55 m/s
Area-P 1/2: 3.48 cm2
Calc EF: 64.4 %
Height: 66 in
S' Lateral: 2.5 cm
Single Plane A2C EF: 58.9 %
Single Plane A4C EF: 70 %
Weight: 2698.43 oz

## 2021-05-16 LAB — CBC
HCT: 36.9 % — ABNORMAL LOW (ref 39.0–52.0)
Hemoglobin: 12.3 g/dL — ABNORMAL LOW (ref 13.0–17.0)
MCH: 29.4 pg (ref 26.0–34.0)
MCHC: 33.3 g/dL (ref 30.0–36.0)
MCV: 88.3 fL (ref 80.0–100.0)
Platelets: 257 10*3/uL (ref 150–400)
RBC: 4.18 MIL/uL — ABNORMAL LOW (ref 4.22–5.81)
RDW: 13 % (ref 11.5–15.5)
WBC: 8 10*3/uL (ref 4.0–10.5)
nRBC: 0 % (ref 0.0–0.2)

## 2021-05-16 LAB — COMPREHENSIVE METABOLIC PANEL
ALT: 9 U/L (ref 0–44)
AST: 16 U/L (ref 15–41)
Albumin: 3.4 g/dL — ABNORMAL LOW (ref 3.5–5.0)
Alkaline Phosphatase: 46 U/L (ref 38–126)
Anion gap: 6 (ref 5–15)
BUN: 21 mg/dL (ref 8–23)
CO2: 24 mmol/L (ref 22–32)
Calcium: 8.8 mg/dL — ABNORMAL LOW (ref 8.9–10.3)
Chloride: 106 mmol/L (ref 98–111)
Creatinine, Ser: 1.36 mg/dL — ABNORMAL HIGH (ref 0.61–1.24)
GFR, Estimated: 56 mL/min — ABNORMAL LOW (ref >60)
Glucose, Bld: 91 mg/dL (ref 70–99)
Potassium: 4.3 mmol/L (ref 3.5–5.1)
Sodium: 136 mmol/L (ref 135–145)
Total Bilirubin: 0.7 mg/dL (ref 0.3–1.2)
Total Protein: 6.3 g/dL — ABNORMAL LOW (ref 6.5–8.1)

## 2021-05-16 MED ORDER — ACETAMINOPHEN 325 MG PO TABS
650.0000 mg | ORAL_TABLET | Freq: Four times a day (QID) | ORAL | Status: DC | PRN
  Administered 2021-05-16: 650 mg via ORAL
  Filled 2021-05-16: qty 2

## 2021-05-16 MED ORDER — GUAIFENESIN-DM 100-10 MG/5ML PO SYRP
5.0000 mL | ORAL_SOLUTION | ORAL | Status: DC | PRN
  Administered 2021-05-16 – 2021-05-20 (×8): 5 mL via ORAL
  Filled 2021-05-16 (×8): qty 10

## 2021-05-16 NOTE — Progress Notes (Signed)
S: Patient seen and examined on rounds.  Now that he is on blood thinner he has some red urine at the start of his stream which clears as he voids.  He has had no large clots or trouble voiding. ? ?O:  ?Vitals:  ? 05/16/21 1308 05/16/21 1405  ?BP: 109/73   ?Pulse: (!) 56   ?Resp: 16   ?Temp: 98.4 ?F (36.9 ?C)   ?SpO2: 97% 97%  ? ? ?CBC ?   ?Component Value Date/Time  ? WBC 8.0 05/16/2021 0628  ? RBC 4.18 (L) 05/16/2021 5956  ? HGB 12.3 (L) 05/16/2021 3875  ? HCT 36.9 (L) 05/16/2021 6433  ? PLT 257 05/16/2021 0628  ? MCV 88.3 05/16/2021 0628  ? MCH 29.4 05/16/2021 0628  ? MCHC 33.3 05/16/2021 0628  ? RDW 13.0 05/16/2021 0628  ? LYMPHSABS 1.8 05/15/2021 1105  ? MONOABS 0.8 05/15/2021 1105  ? EOSABS 0.4 05/15/2021 1105  ? BASOSABS 0.1 05/15/2021 1105  ? ?He looks well, watching TV ? ? ?Assessment/plan: ?1) BPH, gross hematuria-status post TURP.  Hematuria expected with the blood thinner.  It was somewhat of a limited TURP.  He had a prior TURP in the past.  Okay to continue anticoagulation.  His hemoglobin has fluctuated in the 12 range so will follow. ? 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  He did not do well with BCG (fever) and thus got Mitomycin-C intravesical chemo.  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  We went over the nature risk and benefits of surveillance, BCG, intravesical chemo.  Another option is [place].  I will refer him to Dr. Alen Blew for an outpatient visit to discuss intravesical chemo versus Keytruda. ?

## 2021-05-16 NOTE — Hospital Course (Addendum)
71 year old M with PMH of asthma/COPD overlap syndrome, bladder cancer s/p TURBT and chemo in [place], VTE s/p IVC due to hematuria on anticoagulation, recent TURP on 05/11/2021 presenting with general malaise, nausea, vomiting, atypical chest pain and lower extremity swelling, and admitted for acute segmental and subsegmental PE.  Started on IV heparin.  Vascular surgery and urology consulted.  ? ?Vascular surgery recommended upper extremity Dopplers that were negative for DVT.  Etiology followed patient throughout hospitalization. ? ?Prolonged hospitalization due to gross hematuria on anticoagulation.  Eventually, hematuria improved and he was cleared for discharge by urology on p.o. Eliquis.  Urology to arrange outpatient follow-up and referred to oncology.  ? ? ?  ?

## 2021-05-16 NOTE — TOC Initial Note (Signed)
Transition of Care (TOC) - Initial/Assessment Note  ? ? ?Patient Details  ?Name: James Barker ?MRN: [id number] ?Date of Birth: [birth year] ? ?[facility]) CM/SW Contact:    ?Dessa Phi, RN ?Phone Number: ?05/16/2021, 10:37 AM ? ?Clinical Narrative:  Monitor d/c plans.               ? ? ?Expected Discharge Plan: Home/Self Care ?Barriers to Discharge: Continued Medical Work up ? ? ?Patient Goals and CMS Choice ?Patient states their goals for this hospitalization and ongoing recovery are:: Home ?CMS Medicare.gov Compare Post Acute Care list provided to:: Patient ?Choice offered to / list presented to : Patient ? ?Expected Discharge Plan and Services ?Expected Discharge Plan: Home/Self Care ?  ?Discharge Planning Services: CM Consult ?Post Acute Care Choice: NA ?Living arrangements for the past 2 months: [place] ?                ?  ?  ?  ?  ?  ?  ?  ?  ?  ?  ? ?Prior Living Arrangements/Services ?Living arrangements for the past 2 months: [place] ?Lives with:: Spouse ?Patient language and need for interpreter reviewed:: Yes ?Do you feel safe going back to the place where you live?: Yes      ?  ?Care giver support system in place?: Yes (comment) ?  ?Criminal Activity/Legal Involvement Pertinent to Current Situation/Hospitalization: No - Comment as needed ? ?Activities of Daily Living ?Home Assistive Devices/Equipment: Eyeglasses, Nebulizer, Dentures (specify type) ?ADL Screening (condition at time of admission) ?Patient's cognitive ability adequate to safely complete daily activities?: Yes ?Is the patient deaf or have difficulty hearing?: Yes ?Does the patient have difficulty seeing, even when wearing glasses/contacts?: No ?Does the patient have difficulty concentrating, remembering, or making decisions?: No ?Patient able to express need for assistance with ADLs?: Yes ?Does the patient have difficulty dressing or bathing?: No ?Independently performs ADLs?: Yes (appropriate for  developmental age) ?Does the patient have difficulty walking or climbing stairs?: No ?Weakness of Legs: None ?Weakness of Arms/Hands: None ? ?Permission Sought/Granted ?Permission sought to share information with : Case Manager ?Permission granted to share information with : Yes, Verbal Permission Granted ? Share Information with NAME: Case Manager ?   ?   ?   ? ?Emotional Assessment ?  ?Attitude/Demeanor/Rapport: Gracious ?Affect (typically observed): Accepting ?Orientation: : Oriented to Self, Oriented to Place, Oriented to  Time, Oriented to Situation ?Alcohol / Substance Use: Not Applicable ?Psych Involvement: No (comment) ? ?Admission diagnosis:  Bilateral pulmonary embolism ([place]) [I26.99] ?Acute pulmonary embolism, unspecified pulmonary embolism type, unspecified whether acute cor pulmonale present ([place]) [I26.99] ?Pulmonary embolism ([place]) [I26.99] ?Patient Active Problem List  ? Diagnosis Date Noted  ? Bilateral pulmonary embolism ([place]) 05/15/2021  ? Pulmonary embolism ([place]) 05/15/2021  ? Bladder cancer ([place]) 05/15/2021  ? 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 BPH (benign prostatic hyperplasia) 05/15/2021  ? Chest pain 05/15/2021  ? Nausea & vomiting 05/15/2021  ? Hypercalcemia 05/15/2021  ? Asthma with COPD with exacerbation ([place]) 04/13/2021  ? Eosinophilia 04/13/2021  ? Hypertension 04/13/2021  ? 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 ?PCP:  Lois Huxley, PA ?Pharmacy:   ?[id number] [place] [place] - [address]?Phone: [phone]: [phone]? ?[facility] ?[address]?[place]?Phone: [phone]: [phone]? ? ? ? ?  Social Determinants of Health (SDOH) Interventions ?  ? ?Readmission Risk Interventions ?   ? View : No data to display.  ?  ?  ?  ? ? ? ?

## 2021-05-16 NOTE — Progress Notes (Signed)
Pharmacy: Re- heparin ? ?Patient is a 71 y.o M with bladder cancer (s/p TURB) and hx PE (s/p IVC filter) but not on an anticoag. PTA due to issues with hematuria in the past while on Xarelto.  He presented to the ED on 05/15/21 with c/o CP and was found to have acute bilateral PE.  He's currently on heparin drip for VTE treatment. ? ?- UE and LE doppler negative for DVT ?- hematuria noted but urology is ok with continuing anticoag. at this time ? ?- Confirmatory heparin level is therapeutic at 0.50 on IV heparin 1100 units/hr  ? ?Goal of Therapy:  ?Heparin level 0.3-0.7 units/ml ?Monitor platelets by anticoagulation protocol: Yes ? ?Plan: ?- continue heparin drip at 1100 units/hr ?- Daily heparin level & CBC ?- monitor for severity of hematuria ? ?Netta Cedars, PharmD, BCPS ?05/16/2021 11:24 PM ? ?

## 2021-05-16 NOTE — Progress Notes (Addendum)
ANTICOAGULATION CONSULT NOTE ? ?Pharmacy Consult for heparin ?Indication: pulmonary embolus ? ?No Known Allergies ? ?Patient Measurements: ?Height: '5\' 6"'$  (167.6 cm) ?Weight: 76.5 kg (168 lb 10.4 oz) ?IBW/kg (Calculated) : 63.8 ?Heparin Dosing Weight: 76.4kg ? ?Vital Signs: ?Temp: 98.8 ?F (37.1 ?C) (04/26 0424) ?Temp Source: Oral (04/26 0424) ?BP: 110/77 (04/26 0424) ?Pulse Rate: 55 (04/26 0424) ? ?Labs: ?Recent Labs  ?  05/15/21 ?1105 05/15/21 ?2221 05/16/21 ?0628  ?HGB 13.9  --  12.3*  ?HCT 41.9  --  36.9*  ?PLT 291  --  257  ?HEPARINUNFRC  --  0.62 0.84*  ?CREATININE 1.36*  --  1.36*  ?TROPONINIHS 4  --   --   ? ? ? ?Estimated Creatinine Clearance: 45 mL/min (A) (by C-G formula based on SCr of 1.36 mg/dL (H)). ? ? ?Medical History: ?Past Medical History:  ?Diagnosis Date  ? Arthritis   ? Asthma-COPD overlap syndrome ([place])   ? pulmonology--- dr n desai;   last exacerbation 05-08-2021  ? Benign localized prostatic hyperplasia with lower urinary tract symptoms (LUTS)   ? Chronic cough   ? due to copd  ? Dairy product intolerance   ? DOE (dyspnea on exertion)   ? d/t asthma/ COPD  ? Emphysema, unspecified ([place])   ? Full dentures   ? History of bladder cancer 2020  ? s/p TURBT w/ chemo instillation  ? History of COVID-19   ? per pt 2021 (no at same time of pneumonia)  w/ moderate symptoms , recovered at home, that resolved back to baseline  ? History of hepatitis C 2018  ? hx chronic hepatitis C,  per pt treated and told was cured  ? History of latent tuberculosis 2017  ? pt tested positive for TB exposure, not active, treated w/ INH for 9 months  ? History of MAI infection 2021  ? never treated  ? History of pulmonary embolism   ? (05-09-2021  pt stated never had clot prior to 2017 , none since 2020, had negative blood disorder work-up)//  2017  treated w/ xarelto 6 months//  and recurrent 04/ 2020 in setting severe UTI and deconditioning w/ prolonged admission due to intolerant to anticoagulants IVC filter inserted   ? Hypertension   ? OSA (obstructive sleep apnea)   ? per pt dx yrs ago, intolerant to cpap  ? Pulmonary eosinophilia ([place])   ? S/P IVC filter 2020  ? for PE  ? Weak urinary stream   ? Wears glasses   ? ? ?Medications:  ?Infusions:  ? heparin 1,250 Units/hr (05/15/21 1455)  ? ? ?Assessment: ?59 yom presented to the ED with CP. Found to have new bilateral PE. Pt with history of PE but not currently anticoagulated due to issues with hematuria in the past while on Xarelto- IVC filter was placed in 2020. Hx of bladder cancer s/p TURP 05/11/21. Baseline CBC is WNL. Lower extremity doppler negative for DVT.  Pharmacy consulted to dose IV heparin. ? ?Today, 05/16/2021: ?Heparin level supratherapeutic at 0.84 this AM on heparin 1250 units/hr ?Hgb slight decrease this AM from 13.9 > 12.3 ?No infusion related issues reported by RN ?Patient reported to RN some hematuria this AM, states this has happened previously while on blood thinners in the past ? ?Goal of Therapy:  ?Heparin level 0.3-0.7 units/ml ?Monitor platelets by anticoagulation protocol: Yes ?  ?Plan:  ?Reduce heparin infusion to 1100 units/hr ?Check heparin level 8 hours after rate change ?Daily heparin level and CBC while on  heparin ?Monitor for further s/sx bleeding ? ?Dimple Nanas, PharmD ?05/16/2021 7:38 AM ? ?

## 2021-05-16 NOTE — Care Management Obs Status (Signed)
MEDICARE OBSERVATION STATUS NOTIFICATION ? ? ?Patient Details  ?Name: James Barker ?MRN: [id number] ?Date of Birth: February 28, 1950 ? ? ?Medicare Observation Status Notification Given:  Yes ? ? ? ?Dessa Phi, RN ?05/16/2021, 1:41 PM ?

## 2021-05-16 NOTE — Progress Notes (Signed)
Pharmacy: Re- heparin ? ?Patient is a 71 y.o M with bladder cancer (s/p TURB) and hx PE (s/p IVC filter) but not on an anticoag. PTA due to issues with hematuria in the past while on Xarelto.  He presented to the ED on 05/15/21 with c/o CP and was found to have acute bilateral PE.  He's currently on heparin drip for VTE treatment. ? ?- UE and LE doppler negative for DVT ?- hematuria noted but urology is ok with continuing anticoag. at this time ? ?- heparin level is therapeutic at 0.58 after rate decreased to 1100 units/hr earlier today ? ?Goal of Therapy:  ?Heparin level 0.3-0.7 units/ml ?Monitor platelets by anticoagulation protocol: Yes ? ?Plan: ?- continue heparin drip at 1100 units/hr ?- recheck heparin level at 11p to ensure level is still therapeutic before changing to daily monitoring ?- monitor for severity of hematuria ? ?Dia Sitter, PharmD, BCPS ?05/16/2021 4:50 PM ? ?

## 2021-05-16 NOTE — Progress Notes (Signed)
?PROGRESS NOTE ? ?James Barker FXT:[id number] DOB: 08/05/1950  ? ?PCP: Lois Huxley, PA ? ?Patient is from: Home ? ?DOA: 05/15/2021 LOS: 0 ? ?Chief complaints ?Chief Complaint  ?Patient presents with  ? Chest Pain  ?  ? ?Brief Narrative / Interim history: ?71 year old M with PMH of asthma/COPD overlap syndrome, bladder cancer s/p TURBT and chemo in [place], VTE s/p IVC due to hematuria on anticoagulation, recent TURP on 05/11/2021 presenting with general malaise, nausea, vomiting, atypical chest pain and lower extremity swelling, and admitted for acute segmental and subsegmental PE.  Started on IV heparin.  Vascular surgery and urology consulted. ?   ? ?Subjective: ?Seen and examined earlier this morning.  No major events overnight of this morning.  He noted some hematuria this morning but urine was clearer toward the end of micturition.  No other complaints.  Understandably, concerned about his situation with PE requiring anticoagulation but risk of hematuria.  He reports chest pain with cough.  Cough is productive with whitish to yellowish phlegm.  He denies hemoptysis.  No shortness of breath.  ? ?Objective: ?Vitals:  ? 05/16/21 0911 05/16/21 0957 05/16/21 1308 05/16/21 1405  ?BP:  122/70 109/73   ?Pulse:   (!) 56   ?Resp:   16   ?Temp:   98.4 ?F (36.9 ?C)   ?TempSrc:   Oral   ?SpO2: 95%  97% 97%  ?Weight:      ?Height:      ? ? ?Examination: ? ?GENERAL: No apparent distress.  Nontoxic. ?HEENT: MMM.  Vision and hearing grossly intact.  ?NECK: Supple.  No apparent JVD.  ?RESP:  No IWOB.  Fair aeration bilaterally. ?CVS:  RRR. Heart sounds normal.  ?ABD/GI/GU: BS+. Abd soft, NTND.  ?MSK/EXT:  Moves extremities. No apparent deformity. No edema.  ?SKIN: no apparent skin lesion or wound ?NEURO: Awake, alert and oriented appropriately.  No apparent focal neuro deficit. ?PSYCH: Calm. Normal affect.  ? ?Procedures:  ?None ? ?Microbiology summarized: ?None ? ?Assessment and Plan: ?* Bilateral pulmonary embolism  ([place]) ?CT with segmental to subsegmental embolus in the RUL, RLL and LLL. PESI class IV, high risk (4-11.4% 30 day mortality) based on age, male, hx cancer, hx lung disease. Has infrarenal IVC filter in place (placed ~3 years ago in [place] when he couldn't tolerate anticoagulation for PE's due to hematuria with bladder cancer). Admitting provider discussed with vascular surgery.  Per vascular surgery, IVC in the right place.  Vascular surgery recommended UE Doppler, which is negative.  TTE without significant finding. ?-Patient reports some hematuria.  Slight drop in Hgb. ?-Continue monitoring hematuria.  Meanwhile, continue IV heparin ?-Appreciate help by urology ? ?Hematuria in patient with recent TURP ?Patient with recent TURP on 05/11/2021.  Pathology with urothelial carcinoma in situ. ?-Urology on board. ? ?BPH (benign prostatic hyperplasia) ?S/p TURP on 4/21.  Pathology with urothelial carcinoma in situ ?CT without acute findings, large diverticulum of posterior bladder dome containing small air fluid level presumably due to recent catheterization ?-Per urology. ? ?History of bladder cancer s/p TURBT and Chemo in [place] ?Continue home p.o. Keflex ?Per urology. ? ?Presence of IVC filter ?Placed about 3 years ago in [place] due to hematuria in setting of bladder cancer with history of blood clots ?Should follow up with vascular outpatient ? ?Nausea & vomiting ?CT without acute findings ?-zofran ? ?Chest pain ?Atypical chest pain with cough.  Due to PE and cough.  Serial troponin negative.  TTE reassuring. ?-Antitussive ? ?Hypercalcemia ?Mild,  trend ? ?Hypertension ?Amlodipine, losartan ? ?Allergic Asthma COPD Overlap Syndrome ?Follows with Dr. Shearon Stalls. On dupixent at home.  CT with bilateral bronchial wall thickening c/w infectious or inflammatory bronchitis  ?Continue albuterol, advair, spiriva ? ? ? ?DVT prophylaxis:  ?On IV heparin for anticoagulation. ? ?Code Status: Full code ?Family Communication: Patient  and/or RN. Available if any question.  ?Level of care: Progressive ?Status is: Observation ?The patient will require care spanning > 2 midnights and should be moved to inpatient because: Anticoagulation with IV heparin for pulmonary embolism and monitoring for hematuria ? ? ?Final disposition: Likely home once medically cleared ? ?Consultants:  ?Urology ? ?Sch Meds:  ?Scheduled Meds: ? albuterol  2.5 mg Nebulization TID  ? amLODipine  7.5 mg Oral Daily  ? atorvastatin  20 mg Oral Daily  ? cephALEXin  500 mg Oral QHS  ? losartan  50 mg Oral Daily  ? mometasone-formoterol  2 puff Inhalation BID  ? umeclidinium bromide  1 puff Inhalation Daily  ? ?Continuous Infusions: ? heparin 1,100 Units/hr (05/16/21 1124)  ? ?PRN Meds:.acetaminophen, albuterol, guaiFENesin-dextromethorphan, ondansetron (ZOFRAN) IV ? ?Antimicrobials: ?Anti-infectives (From admission, onward)  ? ? Start     Dose/Rate Route Frequency Ordered Stop  ? 05/15/21 2200  cephALEXin (KEFLEX) capsule 500 mg       ? 500 mg Oral Daily at bedtime 05/15/21 1745 05/19/21 2159  ? ?  ? ? ? ?I have personally reviewed the following labs and images: ?CBC: ?Recent Labs  ?Lab 05/11/21 ?0627 05/15/21 ?1105 05/16/21 ?1740  ?WBC  --  7.9 8.0  ?NEUTROABS  --  4.8  --   ?HGB 12.6* 13.9 12.3*  ?HCT 37.0* 41.9 36.9*  ?MCV  --  87.3 88.3  ?PLT  --  291 257  ? ?BMP &GFR ?Recent Labs  ?Lab 05/11/21 ?0627 05/15/21 ?1105 05/16/21 ?8144  ?NA 141 140 136  ?K 4.1 4.1 4.3  ?CL 108 104 106  ?CO2  --  27 24  ?GLUCOSE 89 95 91  ?BUN 45* 19 21  ?CREATININE 1.70* 1.36* 1.36*  ?CALCIUM  --  10.4* 8.8*  ? ?Estimated Creatinine Clearance: 45 mL/min (A) (by C-G formula based on SCr of 1.36 mg/dL (H)). ?Liver & Pancreas: ?Recent Labs  ?Lab 05/15/21 ?1105 05/16/21 ?0628  ?AST 15 16  ?ALT 10 9  ?ALKPHOS 52 46  ?BILITOT 0.7 0.7  ?PROT 7.7 6.3*  ?ALBUMIN 4.3 3.4*  ? ?Recent Labs  ?Lab 05/15/21 ?1105  ?LIPASE 61*  ? ?No results for input(s): AMMONIA in the last 168 hours. ?Diabetic: ?No results for  input(s): HGBA1C in the last 72 hours. ?No results for input(s): GLUCAP in the last 168 hours. ?Cardiac Enzymes: ?No results for input(s): CKTOTAL, CKMB, CKMBINDEX, TROPONINI in the last 168 hours. ?No results for input(s): PROBNP in the last 8760 hours. ?Coagulation Profile: ?No results for input(s): INR, PROTIME in the last 168 hours. ?Thyroid Function Tests: ?No results for input(s): TSH, T4TOTAL, FREET4, T3FREE, THYROIDAB in the last 72 hours. ?Lipid Profile: ?No results for input(s): CHOL, HDL, LDLCALC, TRIG, CHOLHDL, LDLDIRECT in the last 72 hours. ?Anemia Panel: ?No results for input(s): VITAMINB12, FOLATE, FERRITIN, TIBC, IRON, RETICCTPCT in the last 72 hours. ?Urine analysis: ?No results found for: COLORURINE, APPEARANCEUR, [place], [place], [place], [place], [place], KETONESUR, PROTEINUR, [place], NITRITE, LEUKOCYTESUR ?Sepsis Labs: ?Invalid input(s): PROCALCITONIN, LACTICIDVEN ? ?Microbiology: ?Recent Results (from the past 240 hour(s))  ?Resp Panel by RT-PCR (Flu A&B, Covid) Nasopharyngeal Swab     Status: None  ?  Collection Time: 05/09/21  1:09 PM  ? Specimen: Nasopharyngeal Swab; Nasopharyngeal(NP) swabs in vial transport medium  ?Result Value Ref Range Status  ? SARS Coronavirus 2 by RT PCR NEGATIVE NEGATIVE Final  ?  Comment: (NOTE) ?SARS-CoV-2 target nucleic acids are NOT DETECTED. ? ?The SARS-CoV-2 RNA is generally detectable in upper respiratory ?specimens during the acute phase of infection. The lowest ?concentration of SARS-CoV-2 viral copies this assay can detect is ?138 copies/mL. A negative result does not preclude SARS-Cov-2 ?infection and should not be used as the sole basis for treatment or ?other patient management decisions. A negative result may occur with  ?improper specimen collection/handling, submission of specimen other ?than nasopharyngeal swab, presence of viral mutation(s) within the ?areas targeted by this assay, and inadequate number of viral ?copies(<138 copies/mL). A  negative result must be combined with ?clinical observations, patient history, and epidemiological ?information. The expected result is Negative. ? ?Fact Sheet for Patients:  ?http://guzman.com/

## 2021-05-16 NOTE — Progress Notes (Signed)
BUE venous duplex has been completed. ? ? ?Results can be found under chart review under CV PROC. ?05/16/2021 1:17 PM ?Cashlynn Yearwood RVT, RDMS ? ?

## 2021-05-16 NOTE — Assessment & Plan Note (Addendum)
Patient with recent TURP on 05/11/2021.  Pathology with urothelial carcinoma in situ.  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 ?Recent Labs  ?  04/09/21 ?1051 05/11/21 ?0627 05/15/21 ?1105 05/16/21 ?0156 05/17/21 ?1537 05/18/21 ?9432 05/19/21 ?0445 05/20/21 ?0423  ?HGB 13.3 12.6* 13.9 12.3* 11.6* 11.2* 11.7* 10.8*  ?Foley catheter placed on 4/28.  Significant improvement in his hematuria.  Cleared for discharge by urology. ?Urology to arrange outpatient follow-up ?

## 2021-05-17 ENCOUNTER — Other Ambulatory Visit: Payer: Self-pay | Admitting: Internal Medicine

## 2021-05-17 ENCOUNTER — Observation Stay (HOSPITAL_COMMUNITY): Payer: Medicare Other

## 2021-05-17 DIAGNOSIS — Z86711 Personal history of pulmonary embolism: Secondary | ICD-10-CM | POA: Diagnosis not present

## 2021-05-17 DIAGNOSIS — K5900 Constipation, unspecified: Secondary | ICD-10-CM | POA: Diagnosis not present

## 2021-05-17 DIAGNOSIS — R310 Gross hematuria: Secondary | ICD-10-CM | POA: Diagnosis not present

## 2021-05-17 DIAGNOSIS — Z9079 Acquired absence of other genital organ(s): Secondary | ICD-10-CM | POA: Diagnosis not present

## 2021-05-17 DIAGNOSIS — Z79899 Other long term (current) drug therapy: Secondary | ICD-10-CM | POA: Diagnosis not present

## 2021-05-17 DIAGNOSIS — M351 Other overlap syndromes: Secondary | ICD-10-CM | POA: Diagnosis not present

## 2021-05-17 DIAGNOSIS — G4700 Insomnia, unspecified: Secondary | ICD-10-CM

## 2021-05-17 DIAGNOSIS — N1831 Chronic kidney disease, stage 3a: Secondary | ICD-10-CM

## 2021-05-17 DIAGNOSIS — Z95828 Presence of other vascular implants and grafts: Secondary | ICD-10-CM | POA: Diagnosis not present

## 2021-05-17 DIAGNOSIS — J432 Centrilobular emphysema: Secondary | ICD-10-CM | POA: Diagnosis present

## 2021-05-17 DIAGNOSIS — Z9221 Personal history of antineoplastic chemotherapy: Secondary | ICD-10-CM | POA: Diagnosis not present

## 2021-05-17 DIAGNOSIS — N323 Diverticulum of bladder: Secondary | ICD-10-CM | POA: Diagnosis not present

## 2021-05-17 DIAGNOSIS — G4733 Obstructive sleep apnea (adult) (pediatric): Secondary | ICD-10-CM | POA: Diagnosis present

## 2021-05-17 DIAGNOSIS — Z825 Family history of asthma and other chronic lower respiratory diseases: Secondary | ICD-10-CM | POA: Diagnosis not present

## 2021-05-17 DIAGNOSIS — Z87891 Personal history of nicotine dependence: Secondary | ICD-10-CM | POA: Diagnosis not present

## 2021-05-17 DIAGNOSIS — R112 Nausea with vomiting, unspecified: Secondary | ICD-10-CM | POA: Diagnosis not present

## 2021-05-17 DIAGNOSIS — I129 Hypertensive chronic kidney disease with stage 1 through stage 4 chronic kidney disease, or unspecified chronic kidney disease: Secondary | ICD-10-CM | POA: Diagnosis present

## 2021-05-17 DIAGNOSIS — N400 Enlarged prostate without lower urinary tract symptoms: Secondary | ICD-10-CM | POA: Diagnosis present

## 2021-05-17 DIAGNOSIS — I2694 Multiple subsegmental pulmonary emboli without acute cor pulmonale: Secondary | ICD-10-CM | POA: Diagnosis not present

## 2021-05-17 DIAGNOSIS — Z7951 Long term (current) use of inhaled steroids: Secondary | ICD-10-CM | POA: Diagnosis not present

## 2021-05-17 DIAGNOSIS — I2693 Single subsegmental pulmonary embolism without acute cor pulmonale: Secondary | ICD-10-CM | POA: Diagnosis present

## 2021-05-17 DIAGNOSIS — R079 Chest pain, unspecified: Secondary | ICD-10-CM | POA: Diagnosis not present

## 2021-05-17 DIAGNOSIS — I2699 Other pulmonary embolism without acute cor pulmonale: Secondary | ICD-10-CM | POA: Diagnosis not present

## 2021-05-17 DIAGNOSIS — Z8551 Personal history of malignant neoplasm of bladder: Secondary | ICD-10-CM | POA: Diagnosis not present

## 2021-05-17 DIAGNOSIS — D090 Carcinoma in situ of bladder: Secondary | ICD-10-CM | POA: Diagnosis present

## 2021-05-17 DIAGNOSIS — I10 Essential (primary) hypertension: Secondary | ICD-10-CM | POA: Diagnosis not present

## 2021-05-17 DIAGNOSIS — Z8616 Personal history of COVID-19: Secondary | ICD-10-CM | POA: Diagnosis not present

## 2021-05-17 DIAGNOSIS — Z91011 Allergy to milk products: Secondary | ICD-10-CM | POA: Diagnosis not present

## 2021-05-17 LAB — CBC
HCT: 35.5 % — ABNORMAL LOW (ref 39.0–52.0)
Hemoglobin: 11.6 g/dL — ABNORMAL LOW (ref 13.0–17.0)
MCH: 29.2 pg (ref 26.0–34.0)
MCHC: 32.7 g/dL (ref 30.0–36.0)
MCV: 89.4 fL (ref 80.0–100.0)
Platelets: 236 10*3/uL (ref 150–400)
RBC: 3.97 MIL/uL — ABNORMAL LOW (ref 4.22–5.81)
RDW: 13 % (ref 11.5–15.5)
WBC: 6.9 10*3/uL (ref 4.0–10.5)
nRBC: 0 % (ref 0.0–0.2)

## 2021-05-17 LAB — RENAL FUNCTION PANEL
Albumin: 3.3 g/dL — ABNORMAL LOW (ref 3.5–5.0)
Anion gap: 4 — ABNORMAL LOW (ref 5–15)
BUN: 16 mg/dL (ref 8–23)
CO2: 24 mmol/L (ref 22–32)
Calcium: 8.8 mg/dL — ABNORMAL LOW (ref 8.9–10.3)
Chloride: 111 mmol/L (ref 98–111)
Creatinine, Ser: 1.41 mg/dL — ABNORMAL HIGH (ref 0.61–1.24)
GFR, Estimated: 53 mL/min — ABNORMAL LOW (ref >60)
Glucose, Bld: 89 mg/dL (ref 70–99)
Phosphorus: 3.8 mg/dL (ref 2.5–4.6)
Potassium: 4.2 mmol/L (ref 3.5–5.1)
Sodium: 139 mmol/L (ref 135–145)

## 2021-05-17 LAB — MAGNESIUM: Magnesium: 2.1 mg/dL (ref 1.7–2.4)

## 2021-05-17 LAB — HEPARIN LEVEL (UNFRACTIONATED): Heparin Unfractionated: 0.42 IU/mL (ref 0.30–0.70)

## 2021-05-17 MED ORDER — POLYETHYLENE GLYCOL 3350 17 G PO PACK
17.0000 g | PACK | Freq: Two times a day (BID) | ORAL | Status: DC | PRN
  Administered 2021-05-17 – 2021-05-19 (×4): 17 g via ORAL
  Filled 2021-05-17 (×4): qty 1

## 2021-05-17 MED ORDER — ONDANSETRON HCL 4 MG/2ML IJ SOLN
4.0000 mg | Freq: Four times a day (QID) | INTRAMUSCULAR | Status: DC | PRN
  Administered 2021-05-17 – 2021-05-18 (×2): 4 mg via INTRAVENOUS
  Filled 2021-05-17 (×2): qty 2

## 2021-05-17 MED ORDER — MELATONIN 5 MG PO TABS
5.0000 mg | ORAL_TABLET | Freq: Every day | ORAL | Status: DC
Start: 2021-05-17 — End: 2021-05-20
  Administered 2021-05-17 – 2021-05-19 (×3): 5 mg via ORAL
  Filled 2021-05-17 (×3): qty 1

## 2021-05-17 MED ORDER — NYSTATIN 100000 UNIT/ML MT SUSP
5.0000 mL | Freq: Four times a day (QID) | OROMUCOSAL | Status: DC
  Administered 2021-05-17 – 2021-05-20 (×13): 500000 [IU] via ORAL
  Filled 2021-05-17 (×13): qty 5

## 2021-05-17 MED ORDER — METOCLOPRAMIDE HCL 5 MG/ML IJ SOLN
5.0000 mg | Freq: Three times a day (TID) | INTRAMUSCULAR | Status: DC | PRN
  Administered 2021-05-17: 5 mg via INTRAVENOUS
  Filled 2021-05-17: qty 2

## 2021-05-17 MED ORDER — TRAMADOL HCL 50 MG PO TABS
50.0000 mg | ORAL_TABLET | Freq: Three times a day (TID) | ORAL | Status: DC | PRN
  Administered 2021-05-17: 50 mg via ORAL
  Filled 2021-05-17: qty 1

## 2021-05-17 MED ORDER — OXYCODONE HCL 5 MG PO TABS
5.0000 mg | ORAL_TABLET | Freq: Four times a day (QID) | ORAL | Status: DC | PRN
  Administered 2021-05-17 – 2021-05-20 (×9): 10 mg via ORAL
  Filled 2021-05-17 (×9): qty 2

## 2021-05-17 MED ORDER — OXYCODONE HCL 5 MG PO TABS: 5.0000 mg | ORAL_TABLET | Freq: Three times a day (TID) | ORAL | Status: DC | PRN

## 2021-05-17 MED ORDER — SENNOSIDES-DOCUSATE SODIUM 8.6-50 MG PO TABS
1.0000 | ORAL_TABLET | Freq: Two times a day (BID) | ORAL | Status: DC | PRN
Start: 2021-05-17 — End: 2021-05-19
  Administered 2021-05-17 – 2021-05-18 (×2): 1 via ORAL
  Filled 2021-05-17 (×2): qty 1

## 2021-05-17 MED ORDER — OXYCODONE HCL 5 MG PO TABS
5.0000 mg | ORAL_TABLET | Freq: Four times a day (QID) | ORAL | Status: DC | PRN
  Administered 2021-05-17 (×2): 5 mg via ORAL
  Filled 2021-05-17 (×2): qty 1

## 2021-05-17 NOTE — Assessment & Plan Note (Addendum)
Melatonin at bedtime ?

## 2021-05-17 NOTE — Assessment & Plan Note (Addendum)
Resolved.  Senokot-S and MiraLAX as needed ?

## 2021-05-17 NOTE — Progress Notes (Addendum)
ANTICOAGULATION CONSULT NOTE ? ?Pharmacy Consult for heparin ?Indication: pulmonary embolus ? ?No Known Allergies ? ?Patient Measurements: ?Height: '5\' 6"'$  (167.6 cm) ?Weight: 76.5 kg (168 lb 10.4 oz) ?IBW/kg (Calculated) : 63.8 ?Heparin Dosing Weight: 76.4kg ? ?Vital Signs: ?Temp: 98 ?F (36.7 ?C) (04/27 4818) ?Temp Source: Oral (04/27 5631) ?BP: 118/80 (04/27 0656) ?Pulse Rate: 60 (04/27 0656) ? ?Labs: ?Recent Labs  ?  05/15/21 ?1105 05/15/21 ?2221 05/16/21 ?0628 05/16/21 ?1542 05/16/21 ?2237 05/17/21 ?0438  ?HGB 13.9  --  12.3*  --   --  11.6*  ?HCT 41.9  --  36.9*  --   --  35.5*  ?PLT 291  --  257  --   --  236  ?HEPARINUNFRC  --    < > 0.84* 0.58 0.50 0.42  ?CREATININE 1.36*  --  1.36*  --   --  1.41*  ?TROPONINIHS 4  --   --   --   --   --   ? < > = values in this interval not displayed.  ? ? ? ?Estimated Creatinine Clearance: 43.4 mL/min (A) (by C-G formula based on SCr of 1.41 mg/dL (H)). ? ? ?Medical History: ?Past Medical History:  ?Diagnosis Date  ? Arthritis   ? Asthma-COPD overlap syndrome ([place])   ? pulmonology--- dr n desai;   last exacerbation 05-08-2021  ? Benign localized prostatic hyperplasia with lower urinary tract symptoms (LUTS)   ? Chronic cough   ? due to copd  ? Dairy product intolerance   ? DOE (dyspnea on exertion)   ? d/t asthma/ COPD  ? Emphysema, unspecified ([place])   ? Full dentures   ? History of bladder cancer 2020  ? s/p TURBT w/ chemo instillation  ? History of COVID-19   ? per pt 2021 (no at same time of pneumonia)  w/ moderate symptoms , recovered at home, that resolved back to baseline  ? History of hepatitis C 2018  ? hx chronic hepatitis C,  per pt treated and told was cured  ? History of latent tuberculosis 2017  ? pt tested positive for TB exposure, not active, treated w/ INH for 9 months  ? History of MAI infection 2021  ? never treated  ? History of pulmonary embolism   ? (05-09-2021  pt stated never had clot prior to 2017 , none since 2020, had negative blood disorder  work-up)//  2017  treated w/ xarelto 6 months//  and recurrent 04/ 2020 in setting severe UTI and deconditioning w/ prolonged admission due to intolerant to anticoagulants IVC filter inserted  ? Hypertension   ? OSA (obstructive sleep apnea)   ? per pt dx yrs ago, intolerant to cpap  ? Pulmonary eosinophilia ([place])   ? S/P IVC filter 2020  ? for PE  ? Weak urinary stream   ? Wears glasses   ? ? ?Medications:  ?Infusions:  ? heparin 1,100 Units/hr (05/16/21 1124)  ? ? ?Assessment: ?43 yom presented to the ED with CP. Found to have new bilateral PE. Pt with history of PE but not currently anticoagulated due to issues with hematuria in the past while on Xarelto- IVC filter was placed in 2020. Hx of bladder cancer s/p TURP 05/11/21. Baseline CBC is WNL. Both upper and lower extremity doppler negative for DVT.  Pharmacy consulted to dose IV heparin. ? ?Today, 05/17/2021: ?Heparin level therapeutic at 0.42 this AM on heparin 1100 units/hr ?Hgb decrease this AM from 12.3 > 11.6 ?No infusion related issues reported  by RN ?Noted that patient has been experiencing hematuria - urology following and ok to continue anticoagulation at this time ? ?Goal of Therapy:  ?Heparin level 0.3-0.7 units/ml ?Monitor platelets by anticoagulation protocol: Yes ?  ?Plan:  ?Continue heparin infusion at 1100 units/hr ?Daily heparin level and CBC while on heparin ?Monitor severity of hematuria ? ?Dimple Nanas, PharmD ?05/17/2021 7:07 AM ? ?ADDENDUM: ?- Patient reported to RN continued hematuria that does not improve as he voids.   ?- D/w urology and TRH - continue IV heparin given new PE despite IVC filter in place ? ?Dimple Nanas, PharmD ?05/17/2021 10:57 AM ? ? ?

## 2021-05-17 NOTE — Progress Notes (Addendum)
S: Pt now voiding with red urine and clots. He has a good stream and is emptying his bladder. ? ? ?O: ?Vitals:  ? 05/17/21 0913 05/17/21 1158  ?BP: 131/88   ?Pulse:    ?Resp:    ?Temp:    ?SpO2:  97%  ? ?CBC ?   ?Component Value Date/Time  ? WBC 6.9 05/17/2021 0438  ? RBC 3.97 (L) 05/17/2021 0438  ? HGB 11.6 (L) 05/17/2021 0438  ? HCT 35.5 (L) 05/17/2021 0438  ? PLT 236 05/17/2021 0438  ? MCV 89.4 05/17/2021 0438  ? MCH 29.2 05/17/2021 0438  ? MCHC 32.7 05/17/2021 0438  ? RDW 13.0 05/17/2021 0438  ? LYMPHSABS 1.8 05/15/2021 1105  ? MONOABS 0.8 05/15/2021 1105  ? EOSABS 0.4 05/15/2021 1105  ? BASOSABS 0.1 05/15/2021 1105  ? ?No acute distress, he is watching TV. ? ? ?Assessment/plan: ? ?1) gross hematuria-hgb from 12.3 to 11.6, bleeding likely from prostate bleeding given recent TURP.  I will set up a renal ultrasound just to check the bladder for clots.  We talked about placing a catheter and irrigating his bladder but he really hopes to avoid a catheter.  We also discussed cystoscopy with fulguration of the prostatic urethra.  Again he really hopes to avoid catheter and procedures.  He has not required blood transfusion.  We will continue to monitor.  I will set up a renal ultrasound to check his bladder for any clots. ? ?2) BPH-status post TURP.  This was a limited TURP as he had a prior UroLift and TURP. ? 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  He had fever with BCG and recurrence after intravesical mitomycin C.  Once he is more stable and out of hospital I will refer to Dr. Alen Blew to consider options. ?

## 2021-05-17 NOTE — Progress Notes (Addendum)
?PROGRESS NOTE ? ?James Barker WVP:[id number] DOB: 09/28/1950  ? ?PCP: Lois Huxley, PA ? ?Patient is from: Home ? ?DOA: 05/15/2021 LOS: 0 ? ?Chief complaints ?Chief Complaint  ?Patient presents with  ? Chest Pain  ?  ? ?Brief Narrative / Interim history: ?71 year old M with PMH of asthma/COPD overlap syndrome, bladder cancer s/p TURBT and chemo in [place], VTE s/p IVC due to hematuria on anticoagulation, recent TURP on 05/11/2021 presenting with general malaise, nausea, vomiting, atypical chest pain and lower extremity swelling, and admitted for acute segmental and subsegmental PE.  Started on IV heparin.  Vascular surgery and urology consulted. ? ?Patient seems to have ongoing gross hematuria on IV heparin.  Hemoglobin slowly drifting down.  Urology on board. ?   ? ?Subjective: ?Seen and examined earlier this morning.  No major events overnight of this morning.  He reports worsening hematuria.  He reports blood clots as well.  He also complains of left-sided chest pain with deep breathing and cough.  He had difficulty falling asleep in hospital.  He also reports some nausea but no emesis.  Has not had a bowel movement in the last 2 to 3 days. ? ?Objective: ?Vitals:  ? 05/17/21 0833 05/17/21 0913 05/17/21 1158 05/17/21 1321  ?BP:  131/88  129/86  ?Pulse:    (!) 55  ?Resp:    14  ?Temp:    98.4 ?F (36.9 ?C)  ?TempSrc:    Oral  ?SpO2: 98%  97% 99%  ?Weight:      ?Height:      ? ? ?Examination: ? ?GENERAL: No apparent distress.  Nontoxic. ?HEENT: MMM.  Vision and hearing grossly intact.  ?NECK: Supple.  No apparent JVD.  ?RESP:  No IWOB.  Fair aeration bilaterally. ?CVS:  RRR. Heart sounds normal.  ?ABD/GI/GU: BS+. Abd soft, NTND.  ?MSK/EXT:  Moves extremities. No apparent deformity. No edema.  ?SKIN: no apparent skin lesion or wound ?NEURO: Awake and alert. Oriented appropriately.  No apparent focal neuro deficit. ?PSYCH: Calm. Normal affect.  ? ?Procedures:  ?None ? ?Microbiology summarized: ?None ? ?Assessment  and Plan: ?* Bilateral pulmonary embolism ([place]) ?CT with segmental to subsegmental embolus in the RUL, RLL and LLL. PESI class IV, high risk (4-11.4% 30 day mortality) based on age, male, hx cancer, hx lung disease. Has infrarenal IVC filter in place (placed ~3 years ago in [place] when he couldn't tolerate anticoagulation for PE's due to hematuria with bladder cancer). Admitting provider discussed with vascular surgery.  Per vascular surgery, IVC in the right place.  Vascular surgery recommended UE Doppler, which is negative.  TTE without significant finding. ?-Patient reports worsening gross hematuria.  Hemoglobin is slowly drifting down. ?-Continue monitoring hematuria.  Meanwhile, continue IV heparin ?-Urology following. ? ?Gross hematuria ?Patient with recent TURP on 05/11/2021.  Pathology with urothelial carcinoma in situ. ?Recent Labs  ?  04/09/21 ?1051 05/11/21 ?0627 05/15/21 ?1105 05/16/21 ?5462 05/17/21 ?0438  ?HGB 13.3 12.6* 13.9 12.3* 11.6*  ?-Urology following. ? ?CKD-3A ?Recent Labs  ?  05/11/21 ?0627 05/15/21 ?1105 05/16/21 ?0628 05/17/21 ?0438  ?BUN 45* '19 21 16  '$ ?CREATININE 1.70* 1.36* 1.36* 1.41*  ?Stable.  Continue monitoring ? ? ?BPH (benign prostatic hyperplasia) ?S/p limited TURP on 4/21. Per urology, this was a limited TURP as he had a prior UroLift and TURP.  ?CT without acute findings, large diverticulum of posterior bladder dome containing small air fluid level presumably due to recent catheterization ?-Per urology. ? 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 Had fever with BCG and recurrence after intravesical mitomycin C.   ?-Urology planning referral to Dr. Alen Blew to consider options on discharge. ?-Continue home p.o. Keflex ? ?Presence of IVC filter ?Placed about 3 years ago in [place] due to hematuria in setting of bladder cancer with history of blood clots ?Should follow up with vascular outpatient ? ?Nausea & vomiting ?CT  without acute findings. No emesis but reports nausea ?-Zofran ? ?Chest pain ?Atypical chest pain with cough.  Due to PE and cough.  Serial troponin negative.  TTE reassuring. ?-Antitussive and analgesics ? ?Hypercalcemia ?Mild, trend ? ?Hypertension ?Amlodipine, losartan ? ?Allergic Asthma COPD Overlap Syndrome ?Follows with Dr. Shearon Stalls. On dupixent at home.  CT with bilateral bronchial wall thickening c/w infectious or inflammatory bronchitis  ?Continue albuterol, advair, spiriva ? ? ?Insomnia ?Try melatonin at bedtime ? ?Constipation ?Bowel regimen ? ? ?DVT prophylaxis:  ?On IV heparin for anticoagulation. ? ?Code Status: Full code ?Family Communication: Patient and/or RN. Available if any question.  ?Level of care: Telemetry ?Status is: Observation ?The patient will require care spanning > 2 midnights and should be moved to inpatient because: Anticoagulation with IV heparin for pulmonary embolism and concurrent gross hematuria in patient with history of bladder cancer and recent TURP ? ? ?Final disposition: Likely home once medically cleared ? ?Consultants:  ?Urology ? ?Sch Meds:  ?Scheduled Meds: ? albuterol  2.5 mg Nebulization TID  ? amLODipine  7.5 mg Oral Daily  ? atorvastatin  20 mg Oral Daily  ? cephALEXin  500 mg Oral QHS  ? losartan  50 mg Oral Daily  ? melatonin  5 mg Oral QHS  ? mometasone-formoterol  2 puff Inhalation BID  ? nystatin  5 mL Oral QID  ? umeclidinium bromide  1 puff Inhalation Daily  ? ?Continuous Infusions: ? heparin 1,100 Units/hr (05/17/21 1130)  ? ?PRN Meds:.acetaminophen, albuterol, guaiFENesin-dextromethorphan, ondansetron (ZOFRAN) IV, oxyCODONE, polyethylene glycol, senna-docusate, traMADol ? ?Antimicrobials: ?Anti-infectives (From admission, onward)  ? ? Start     Dose/Rate Route Frequency Ordered Stop  ? 05/15/21 2200  cephALEXin (KEFLEX) capsule 500 mg       ? 500 mg Oral Daily at bedtime 05/15/21 1745 05/19/21 2159  ? ?  ? ? ? ?I have personally reviewed the following labs and  images: ?CBC: ?Recent Labs  ?Lab 05/11/21 ?7673 05/15/21 ?1105 05/16/21 ?4193 05/17/21 ?0438  ?WBC  --  7.9 8.0 6.9  ?NEUTROABS  --  4.8  --   --   ?HGB 12.6* 13.9 12.3* 11.6*  ?HCT 37.0* 41.9 36.9* 35.5*  ?MCV  --  87.3 88.3 89.4  ?PLT  --  291 257 236  ? ?BMP &GFR ?Recent Labs  ?Lab 05/11/21 ?7902 05/15/21 ?1105 05/16/21 ?4097 05/17/21 ?0438  ?NA 141 140 136 139  ?K 4.1 4.1 4.3 4.2  ?CL 108 104 106 111  ?CO2  --  '27 24 24  '$ ?GLUCOSE 89 95 91 89  ?BUN 45* '19 21 16  '$ ?CREATININE 1.70* 1.36* 1.36* 1.41*  ?CALCIUM  --  10.4* 8.8* 8.8*  ?MG  --   --   --  2.1  ?PHOS  --   --   --  3.8  ? ?Estimated Creatinine Clearance: 43.4 mL/min (A) (by C-G formula based on SCr of 1.41 mg/dL (H)). ?Liver & Pancreas: ?Recent Labs  ?Lab 05/15/21 ?1105 05/16/21 ?0628 05/17/21 ?0438  ?AST 15 16  --   ?  ALT 10 9  --   ?ALKPHOS 52 46  --   ?BILITOT 0.7 0.7  --   ?PROT 7.7 6.3*  --   ?ALBUMIN 4.3 3.4* 3.3*  ? ?Recent Labs  ?Lab 05/15/21 ?1105  ?LIPASE 61*  ? ?No results for input(s): AMMONIA in the last 168 hours. ?Diabetic: ?No results for input(s): HGBA1C in the last 72 hours. ?No results for input(s): GLUCAP in the last 168 hours. ?Cardiac Enzymes: ?No results for input(s): CKTOTAL, CKMB, CKMBINDEX, TROPONINI in the last 168 hours. ?No results for input(s): PROBNP in the last 8760 hours. ?Coagulation Profile: ?No results for input(s): INR, PROTIME in the last 168 hours. ?Thyroid Function Tests: ?No results for input(s): TSH, T4TOTAL, FREET4, T3FREE, THYROIDAB in the last 72 hours. ?Lipid Profile: ?No results for input(s): CHOL, HDL, LDLCALC, TRIG, CHOLHDL, LDLDIRECT in the last 72 hours. ?Anemia Panel: ?No results for input(s): VITAMINB12, FOLATE, FERRITIN, TIBC, IRON, RETICCTPCT in the last 72 hours. ?Urine analysis: ?No results found for: COLORURINE, APPEARANCEUR, [place], [place], [place], [place], [place], KETONESUR, PROTEINUR, [place], NITRITE, LEUKOCYTESUR ?Sepsis Labs: ?Invalid input(s): PROCALCITONIN,  LACTICIDVEN ? ?Microbiology: ?Recent Results (from the past 240 hour(s))  ?Resp Panel by RT-PCR (Flu A&B, Covid) Nasopharyngeal Swab     Status: None  ? Collection Time: 05/09/21  1:09 PM  ? Specimen: Nasopharyngeal Swab; Addison Bailey

## 2021-05-17 NOTE — Assessment & Plan Note (Addendum)
Recent Labs  ?  05/11/21 ?0627 05/15/21 ?1105 05/16/21 ?0628 05/17/21 ?0438 05/18/21 ?6151 05/20/21 ?0423  ?BUN 45* '19 21 16 18 13  '$ ?CREATININE 1.70* 1.36* 1.36* 1.41* 1.48* 1.58*  ?Discontinue losartan with hope of getting some renal function back ?Increased amlodipine from 7.5 to 10 mg for hypertension ? ?

## 2021-05-18 ENCOUNTER — Inpatient Hospital Stay (HOSPITAL_COMMUNITY): Payer: Medicare Other

## 2021-05-18 DIAGNOSIS — N400 Enlarged prostate without lower urinary tract symptoms: Secondary | ICD-10-CM | POA: Diagnosis not present

## 2021-05-18 DIAGNOSIS — R310 Gross hematuria: Secondary | ICD-10-CM | POA: Diagnosis not present

## 2021-05-18 DIAGNOSIS — N1831 Chronic kidney disease, stage 3a: Secondary | ICD-10-CM

## 2021-05-18 DIAGNOSIS — I2699 Other pulmonary embolism without acute cor pulmonale: Secondary | ICD-10-CM | POA: Diagnosis not present

## 2021-05-18 DIAGNOSIS — Z8551 Personal history of malignant neoplasm of bladder: Secondary | ICD-10-CM | POA: Diagnosis not present

## 2021-05-18 LAB — RENAL FUNCTION PANEL
Albumin: 3.2 g/dL — ABNORMAL LOW (ref 3.5–5.0)
Anion gap: 5 (ref 5–15)
BUN: 18 mg/dL (ref 8–23)
CO2: 24 mmol/L (ref 22–32)
Calcium: 8.9 mg/dL (ref 8.9–10.3)
Chloride: 108 mmol/L (ref 98–111)
Creatinine, Ser: 1.48 mg/dL — ABNORMAL HIGH (ref 0.61–1.24)
GFR, Estimated: 50 mL/min — ABNORMAL LOW (ref >60)
Glucose, Bld: 96 mg/dL (ref 70–99)
Phosphorus: 4.1 mg/dL (ref 2.5–4.6)
Potassium: 4.1 mmol/L (ref 3.5–5.1)
Sodium: 137 mmol/L (ref 135–145)

## 2021-05-18 LAB — CBC
HCT: 34.6 % — ABNORMAL LOW (ref 39.0–52.0)
Hemoglobin: 11.2 g/dL — ABNORMAL LOW (ref 13.0–17.0)
MCH: 29.4 pg (ref 26.0–34.0)
MCHC: 32.4 g/dL (ref 30.0–36.0)
MCV: 90.8 fL (ref 80.0–100.0)
Platelets: 219 10*3/uL (ref 150–400)
RBC: 3.81 MIL/uL — ABNORMAL LOW (ref 4.22–5.81)
RDW: 13.2 % (ref 11.5–15.5)
WBC: 7.3 10*3/uL (ref 4.0–10.5)
nRBC: 0 % (ref 0.0–0.2)

## 2021-05-18 LAB — MAGNESIUM: Magnesium: 2 mg/dL (ref 1.7–2.4)

## 2021-05-18 LAB — HEPARIN LEVEL (UNFRACTIONATED): Heparin Unfractionated: 0.67 IU/mL (ref 0.30–0.70)

## 2021-05-18 MED ORDER — LIDOCAINE 5 % EX PTCH
1.0000 | MEDICATED_PATCH | CUTANEOUS | Status: DC
  Administered 2021-05-18 – 2021-05-19 (×2): 1 via TRANSDERMAL
  Filled 2021-05-18 (×3): qty 1

## 2021-05-18 NOTE — Progress Notes (Signed)
ANTICOAGULATION CONSULT NOTE ? ?Pharmacy Consult for heparin ?Indication: pulmonary embolus ? ?Allergies  ?Allergen Reactions  ? Cheese Nausea And Vomiting  ? Lactose Intolerance (Gi) Diarrhea and Nausea And Vomiting  ? ? ?Patient Measurements: ?Height: '5\' 6"'$  (167.6 cm) ?Weight: 76.8 kg (169 lb 5 oz) ?IBW/kg (Calculated) : 63.8 ?Heparin Dosing Weight: 76.4kg ? ?Vital Signs: ?Temp: 97.8 ?F (36.6 ?C) (04/28 0447) ?Temp Source: Oral (04/28 0447) ?BP: 111/74 (04/28 0447) ?Pulse Rate: 49 (04/28 0447) ? ?Labs: ?Recent Labs  ?  05/15/21 ?1105 05/15/21 ?2221 05/16/21 ?0628 05/16/21 ?1542 05/16/21 ?2237 05/17/21 ?0109 05/18/21 ?0440  ?HGB 13.9  --  12.3*  --   --  11.6* 11.2*  ?HCT 41.9  --  36.9*  --   --  35.5* 34.6*  ?PLT 291  --  257  --   --  236 219  ?HEPARINUNFRC  --    < > 0.84*   < > 0.50 0.42 0.67  ?CREATININE 1.36*  --  1.36*  --   --  1.41* 1.48*  ?TROPONINIHS 4  --   --   --   --   --   --   ? < > = values in this interval not displayed.  ? ? ? ?Estimated Creatinine Clearance: 44.7 mL/min (A) (by C-G formula based on SCr of 1.48 mg/dL (H)). ? ? ?Medical History: ?Past Medical History:  ?Diagnosis Date  ? Arthritis   ? Asthma-COPD overlap syndrome ([place])   ? pulmonology--- dr n desai;   last exacerbation 05-08-2021  ? Benign localized prostatic hyperplasia with lower urinary tract symptoms (LUTS)   ? Chronic cough   ? due to copd  ? Dairy product intolerance   ? DOE (dyspnea on exertion)   ? d/t asthma/ COPD  ? Emphysema, unspecified ([place])   ? Full dentures   ? History of bladder cancer 2020  ? s/p TURBT w/ chemo instillation  ? History of COVID-19   ? per pt 2021 (no at same time of pneumonia)  w/ moderate symptoms , recovered at home, that resolved back to baseline  ? History of hepatitis C 2018  ? hx chronic hepatitis C,  per pt treated and told was cured  ? History of latent tuberculosis 2017  ? pt tested positive for TB exposure, not active, treated w/ INH for 9 months  ? History of MAI infection 2021  ?  never treated  ? History of pulmonary embolism   ? (05-09-2021  pt stated never had clot prior to 2017 , none since 2020, had negative blood disorder work-up)//  2017  treated w/ xarelto 6 months//  and recurrent 04/ 2020 in setting severe UTI and deconditioning w/ prolonged admission due to intolerant to anticoagulants IVC filter inserted  ? Hypertension   ? OSA (obstructive sleep apnea)   ? per pt dx yrs ago, intolerant to cpap  ? 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 S/P IVC filter 2020  ? for PE  ? Weak urinary stream   ? Wears glasses   ? ? ?Medications:  ?Infusions:  ? heparin 1,100 Units/hr (05/17/21 1130)  ? ? ?Assessment: ?76 yom presented to the ED with CP. Found to have new bilateral PE. Pt with history of PE but not currently anticoagulated due to issues with hematuria in the past while on Xarelto- IVC filter was placed in 2020. Hx of bladder cancer s/p TURP 05/11/21. Baseline CBC is WNL. Both upper and lower extremity doppler negative for DVT.  Pharmacy  consulted to dose IV heparin. ? ?4/27 Renal US: small amount of debris seen within the urinary bladder, which ma be related to stasis or blood products ? ?Today, 05/18/2021: ?Heparin level therapeutic at 0.67 this AM on heparin 1100 units/hr, however this is nearing the upper end of goal ?Hgb stable this AM (11.6>11.2) ?No infusion related issues reported by RN ?Noted that patient has been experiencing hematuria - urology following and ok to continue anticoagulation at this time. No other bleeding noted.  ? ?Goal of Therapy:  ?Heparin level 0.3-0.7 units/ml ?Monitor platelets by anticoagulation protocol: Yes ?  ?Plan:  ?Reduce heparin infusion slightly to 1050 units/hr ?Daily heparin level and CBC while on heparin ?F/u for any urological procedures and if heparin needs to be held prior ? ?Dimple Nanas, PharmD ?05/18/2021 7:05 AM ? ?

## 2021-05-18 NOTE — Progress Notes (Signed)
?Subjective: ?Patient reports continued hematuria with red urine but no clots.  Renal ultrasound yesterday with no hydronephrosis or mass with a small amount of clot in the bladder. ? ?Objective: ?Vital signs in last 24 hours: ?Temp:  [97.5 ?F (36.4 ?C)-97.8 ?F (36.6 ?C)] 97.6 ?F (36.4 ?C) (04/28 1228) ?Pulse Rate:  [49-56] 51 (04/28 1228) ?Resp:  [12-20] 12 (04/28 1500) ?BP: (111-115)/(71-74) 115/72 (04/28 1228) ?SpO2:  [97 %-100 %] 99 % (04/28 1500) ?Weight:  [76.8 kg] 76.8 kg (04/28 0447) ? ?Intake/Output from previous day: ?04/27 0701 - 04/28 0700 ?In: 616.1 [P.O.:400; I.V.:216.1] ?Out: -  ?Intake/Output this shift: ?Total I/O ?In: 290.8 [I.V.:290.8] ?Out: 550 [Urine:550] ? ?Physical Exam:  ?He looks well.  In bed watching TV ?Urine in urinal is dark red.  No clots. ? ?Discussed with patient we need to make a decision about him starting Eliquis and getting out of the hospital.  His hemoglobin has slowly declined and the hematuria seems to be slowly improving.  Is difficult to tell just how much she is bleeding, we discussed the nature risk benefits and alternatives to Foley catheter bladder irrigation and monitoring urine output for a day or 2 with the Foley.  He elected to proceed. ? ?Procedure: He was prepped and draped in the usual sterile fashion and an 82 French catheter was passed without difficulty.  Nurse Mia Creek assisted.  Urine was light red.  Looks much lighter than in the urinal.  I irrigated his bladder.  No clots. ? ?Lab Results: ?Recent Labs  ?  05/16/21 ?0628 05/17/21 ?0438 05/18/21 ?0440  ?HGB 12.3* 11.6* 11.2*  ?HCT 36.9* 35.5* 34.6*  ? ?BMET ?Recent Labs  ?  05/17/21 ?0438 05/18/21 ?0440  ?NA 139 137  ?K 4.2 4.1  ?CL 111 108  ?CO2 24 24  ?GLUCOSE 89 96  ?BUN 16 18  ?CREATININE 1.41* 1.48*  ?CALCIUM 8.8* 8.9  ? ?No results for input(s): LABPT, INR in the last 72 hours. ?No results for input(s): LABURIN in the last 72 hours. ?Results for orders placed or performed during the hospital encounter  of 05/11/21  ?Resp Panel by RT-PCR (Flu A&B, Covid) Nasopharyngeal Swab     Status: None  ? Collection Time: 05/09/21  1:09 PM  ? Specimen: Nasopharyngeal Swab; Nasopharyngeal(NP) swabs in vial transport medium  ?Result Value Ref Range Status  ? SARS Coronavirus 2 by RT PCR NEGATIVE NEGATIVE Final  ?  Comment: (NOTE) ?SARS-CoV-2 target nucleic acids are NOT DETECTED. ? ?The SARS-CoV-2 RNA is generally detectable in upper respiratory ?specimens during the acute phase of infection. The lowest ?concentration of SARS-CoV-2 viral copies this assay can detect is ?138 copies/mL. A negative result does not preclude SARS-Cov-2 ?infection and should not be used as the sole basis for treatment or ?other patient management decisions. A negative result may occur with  ?improper specimen collection/handling, submission of specimen other ?than nasopharyngeal swab, presence of viral mutation(s) within the ?areas targeted by this assay, and inadequate number of viral ?copies(<138 copies/mL). A negative result must be combined with ?clinical observations, patient history, and epidemiological ?information. The expected result is Negative. ? ?Fact Sheet for Patients:  ?EntrepreneurPulse.com.au ? ?Fact Sheet for Healthcare Providers:  ?IncredibleEmployment.be ? ?This test is no t yet approved or cleared by the [place] FDA and  ?has been authorized for detection and/or diagnosis of SARS-CoV-2 by ?FDA under an Emergency Use Authorization (EUA). This EUA will remain  ?in effect (meaning this test can be used) for the duration of  the ?COVID-19 declaration under Section 564(b)(1) of the Act, 21 ?U.S.C.section 360bbb-3(b)(1), unless the authorization is terminated  ?or revoked sooner.  ? ? ?  ? Influenza A by PCR NEGATIVE NEGATIVE Final  ? Influenza B by PCR NEGATIVE NEGATIVE Final  ?  Comment: (NOTE) ?The Xpert Xpress SARS-CoV-2/FLU/RSV plus assay is intended as an aid ?in the diagnosis of influenza  from Nasopharyngeal swab specimens and ?should not be used as a sole basis for treatment. Nasal washings and ?aspirates are unacceptable for Xpert Xpress SARS-CoV-2/FLU/RSV ?testing. ? ?Fact Sheet for Patients: ?EntrepreneurPulse.com.au ? ?Fact Sheet for Healthcare Providers: ?IncredibleEmployment.be ? ?This test is not yet approved or cleared by the [place] FDA and ?has been authorized for detection and/or diagnosis of SARS-CoV-2 by ?FDA under an Emergency Use Authorization (EUA). This EUA will remain ?in effect (meaning this test can be used) for the duration of the ?COVID-19 declaration under Section 564(b)(1) of the Act, 21 U.S.C. ?section 360bbb-3(b)(1), unless the authorization is terminated or ?revoked. ? ?Performed at [facility], [address] ?[place], [place] [address] ?  ? ? ?Studies/Results: ?DG Chest 2 View ? ?Result Date: 05/18/2021 ?CLINICAL DATA:  Chest pain.  Recent diagnosis of pulmonary embolism. EXAM: CHEST - 2 VIEW COMPARISON:  05/15/2021; chest CT-05/15/2021 FINDINGS: Grossly unchanged cardiac silhouette and mediastinal contours with atherosclerotic plaque within the thoracic aorta. The lungs appear hyperexpanded with flattening diaphragms and mild diffuse slightly nodular thickening of the pulmonary interstitium. Right mid lung linear heterogeneous opacities are unchanged. Minimal right basilar heterogeneous opacities unchanged. No definite pleural effusion or pneumothorax. No acute osseous abnormalities. Suspected old/healed right eighth rib fracture. IMPRESSION: Similar findings of lung hyperexpansion and right basilar heterogeneous opacities, atelectasis versus infiltrate. Electronically Signed   By: Sandi Mariscal M.D.   On: 05/18/2021 11:05  ? ?US RENAL ? ?Result Date: 05/17/2021 ?CLINICAL DATA:  Gross hematuria EXAM: RENAL / URINARY TRACT ULTRASOUND COMPLETE COMPARISON:  CT 05/15/2021 FINDINGS: Right Kidney: Renal measurements:  8.8 x 4.4 x 5.8 cm = volume: 117 mL. Echogenicity within normal limits. 2.4 cm exophytic cyst emanating from the interpolar region of the right kidney. No shadowing stone or hydronephrosis visualized. Left Kidney: Renal measurements: 10.7 x 3.8 x 4.2 cm = volume: 89 mL. Echogenicity within normal limits. No mass, shadowing stone, or hydronephrosis visualized. Bladder: Moderate-sized narrow-necked diverticulum emanates from the posterosuperior aspect of the bladder, as seen on recent CT. Small amount of debris within the bladder dependently. Other: None. IMPRESSION: 1. Urinary bladder diverticulum, as seen on recent CT. 2. A small amount of debris is seen within the urinary bladder, which may be related to stasis or blood products. 3. No evidence of obstructive uropathy. Electronically Signed   By: Davina Poke D.O.   On: 05/17/2021 15:37   ? ?Assessment/Plan: ?1) gross hematuria-status post TURP with bleeding on heparin drip.  Hematuria appears to be slowly improving.  A catheter will give [place] a better gauge of the hematuria.  If his urine remains light red to pink it is okay to start Eliquis from a urology point of view, DC Foley and DC patient home. ? ?I'll have Dr. Cain Sieve check urine color tomorrow.  ? ?2) BPH status post TURP.  This was not an extensive TURP.  Patient had prior TURP after prostatic urethral lift. ? 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  This was a cold cup biopsy in a right diverticulum.  Again with the catheter hopefully everything decompresses and hematuria continues  to improve. ? ? ? ? LOS: 1 day  ? ?Festus Aloe ?05/18/2021, 5:59 PM ? ? ? ?

## 2021-05-18 NOTE — Progress Notes (Signed)
?PROGRESS NOTE ? ?James Barker FYB:[id number] DOB: 1950/04/07  ? ?PCP: Lois Huxley, PA ? ?Patient is from: Home ? ?DOA: 05/15/2021 LOS: 1 ? ?Chief complaints ?Chief Complaint  ?Patient presents with  ? Chest Pain  ?  ? ?Brief Narrative / Interim history: ?71 year old M with PMH of asthma/COPD overlap syndrome, bladder cancer s/p TURBT and chemo in [place], VTE s/p IVC due to hematuria on anticoagulation, recent TURP on 05/11/2021 presenting with general malaise, nausea, vomiting, atypical chest pain and lower extremity swelling, and admitted for acute segmental and subsegmental PE.  Started on IV heparin.  Vascular surgery and urology consulted. ? ?Patient seems to have ongoing gross hematuria on IV heparin.  Hemoglobin slowly drifting down.  Urology on board. ?   ? ?Subjective: ?Seen and examined earlier this morning.  No major events overnight of this morning.  He reports left-sided chest pain mainly with cough.  No shortness of breath.  Continues to endorse gross hematuria.  His urine in urinal looks dark red but no notable blood clot.  Has not had bowel movement yet. ?Objective: ?Vitals:  ? 05/18/21 0731 05/18/21 0732 05/18/21 0734 05/18/21 1228  ?BP:    115/72  ?Pulse:    (!) 51  ?Resp:   16 20  ?Temp:    97.6 ?F (36.4 ?C)  ?TempSrc:    Oral  ?SpO2: 99% 99% 99% 100%  ?Weight:      ?Height:      ? ? ?Examination: ? ?GENERAL: No apparent distress.  Nontoxic. ?HEENT: MMM.  Vision and hearing grossly intact.  ?NECK: Supple.  No apparent JVD.  ?RESP:  No IWOB.  Fair aeration bilaterally. ?CVS:  RRR. Heart sounds normal.  ?ABD/GI/GU: BS+. Abd soft, NTND.  ?MSK/EXT:  Moves extremities.  Tenderness over left lateral chest below his nipple ?SKIN: no apparent skin lesion or wound ?NEURO: Awake and alert. Oriented appropriately.  No apparent focal neuro deficit. ?PSYCH: Calm. Normal affect.  ? ?Procedures:  ?None ? ?Microbiology summarized: ?None ? ?Assessment and Plan: ?* Bilateral pulmonary embolism ([place]) ?CT with  segmental to subsegmental embolus in the RUL, RLL and LLL. PESI class IV, high risk (4-11.4% 30 day mortality) based on age, male, hx cancer, hx lung disease. Has infrarenal IVC filter in place (placed ~3 years ago in [place] when he couldn't tolerate anticoagulation for PE's due to hematuria with bladder cancer). Admitting provider discussed with vascular surgery.  Per vascular surgery, IVC in the right place.  Vascular surgery recommended UE Doppler, which is negative.  TTE without significant finding. ?-Patient reports worsening gross hematuria.  Hemoglobin is slowly drifting down. ?-Continue monitoring hematuria.  Meanwhile, continue IV heparin ?-Urology following. ? ?Gross hematuria ?Patient with recent TURP on 05/11/2021.  Pathology with urothelial carcinoma in situ.  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 ?Recent Labs  ?  04/09/21 ?1051 05/11/21 ?0627 05/15/21 ?1105 05/16/21 ?5277 05/17/21 ?8242 05/18/21 ?0440  ?HGB 13.3 12.6* 13.9 12.3* 11.6* 11.2*  ?-Urology following. ? ?CKD-3A ?Recent Labs  ?  05/11/21 ?0627 05/15/21 ?1105 05/16/21 ?0628 05/17/21 ?0438 05/18/21 ?0440  ?BUN 45* '19 21 16 18  '$ ?CREATININE 1.70* 1.36* 1.36* 1.41* 1.48*  ?Stable.  Continue monitoring ? ? ?BPH (benign prostatic hyperplasia) ?S/p limited TURP on 4/21. Per urology, this was a limited TURP as he had a prior UroLift and TURP.  ?CT without acute findings, large diverticulum of posterior bladder dome containing small air fluid level presumably due to recent catheterization ?-Per  urology. ? 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 Had fever with BCG and recurrence after intravesical mitomycin C.   ?-Urology planning referral to Dr. Alen Blew to consider options on discharge. ?-Continue home p.o. Keflex ? ?Presence of IVC filter ?Placed about 3 years ago in [place] due to hematuria in setting of bladder cancer with history of blood  clots ?Should follow up with vascular outpatient ? ?Nausea & vomiting ?CT without acute findings. No emesis but reports nausea ?-Zofran as needed ?-Reglan for refractory nausea vomiting ? ?Chest pain ?Atypical chest pain with cough likely from PE.  He also have tenderness to palpation suggesting musculoskeletal etiology.  Two view CXR without acute finding.  Serial troponin negative.  TTE reassuring. ?-Continue Mucinex DM ?-Incentive spirometry ?-Tylenol, tramadol and oxycodone as needed based on severity ?-Lidoderm patch to left chest ?-Bowel regimen ? ?Hypocalcemia ?Resolved. ? ?Hypertension ?Amlodipine, losartan ? ?Allergic Asthma COPD Overlap Syndrome ?Follows with Dr. Shearon Stalls. On dupixent at home.  CT with bilateral bronchial wall thickening c/w infectious or inflammatory bronchitis  ?Continue albuterol, advair, spiriva ? ? ?Insomnia ?Melatonin at bedtime ? ?Constipation ?MiraLAX and Senokot-S twice daily as needed ? ? ?DVT prophylaxis:  ?On IV heparin for anticoagulation. ? ?Code Status: Full code ?Family Communication: Patient and/or RN. Available if any question.  ?Level of care: Telemetry ?Status is: Inpatient ?The patient will remain inpatient because: Anticoagulation with IV heparin for pulmonary embolism and concurrent gross hematuria needs close monitoring and possible urologic intervention ? ? ?Final disposition: Likely home once medically cleared ? ?Consultants:  ?Urology ? ?Sch Meds:  ?Scheduled Meds: ? albuterol  2.5 mg Nebulization TID  ? amLODipine  7.5 mg Oral Daily  ? atorvastatin  20 mg Oral Daily  ? cephALEXin  500 mg Oral QHS  ? lidocaine  1 patch Transdermal Q24H  ? losartan  50 mg Oral Daily  ? melatonin  5 mg Oral QHS  ? mometasone-formoterol  2 puff Inhalation BID  ? nystatin  5 mL Oral QID  ? umeclidinium bromide  1 puff Inhalation Daily  ? ?Continuous Infusions: ? heparin 1,050 Units/hr (05/18/21 0941)  ? ?PRN Meds:.acetaminophen, albuterol, guaiFENesin-dextromethorphan, metoCLOPramide  (REGLAN) injection, ondansetron (ZOFRAN) IV, oxyCODONE, polyethylene glycol, senna-docusate, traMADol ? ?Antimicrobials: ?Anti-infectives (From admission, onward)  ? ? Start     Dose/Rate Route Frequency Ordered Stop  ? 05/15/21 2200  cephALEXin (KEFLEX) capsule 500 mg       ? 500 mg Oral Daily at bedtime 05/15/21 1745 05/19/21 2159  ? ?  ? ? ? ?I have personally reviewed the following labs and images: ?CBC: ?Recent Labs  ?Lab 05/15/21 ?1105 05/16/21 ?0628 05/17/21 ?0438 05/18/21 ?0440  ?WBC 7.9 8.0 6.9 7.3  ?NEUTROABS 4.8  --   --   --   ?HGB 13.9 12.3* 11.6* 11.2*  ?HCT 41.9 36.9* 35.5* 34.6*  ?MCV 87.3 88.3 89.4 90.8  ?PLT 291 257 236 219  ? ?BMP &GFR ?Recent Labs  ?Lab 05/15/21 ?1105 05/16/21 ?0628 05/17/21 ?0438 05/18/21 ?0440  ?NA 140 136 139 137  ?K 4.1 4.3 4.2 4.1  ?CL 104 106 111 108  ?CO2 '27 24 24 24  '$ ?GLUCOSE 95 91 89 96  ?BUN '19 21 16 18  '$ ?CREATININE 1.36* 1.36* 1.41* 1.48*  ?CALCIUM 10.4* 8.8* 8.8* 8.9  ?MG  --   --  2.1 2.0  ?PHOS  --   --  3.8 4.1  ? ?Estimated Creatinine Clearance: 44.7  mL/min (A) (by C-G formula based on SCr of 1.48 mg/dL (H)). ?Liver & Pancreas: ?Recent Labs  ?Lab 05/15/21 ?1105 05/16/21 ?0628 05/17/21 ?0438 05/18/21 ?0440  ?AST 15 16  --   --   ?ALT 10 9  --   --   ?ALKPHOS 52 46  --   --   ?BILITOT 0.7 0.7  --   --   ?PROT 7.7 6.3*  --   --   ?ALBUMIN 4.3 3.4* 3.3* 3.2*  ? ?Recent Labs  ?Lab 05/15/21 ?1105  ?LIPASE 61*  ? ?No results for input(s): AMMONIA in the last 168 hours. ?Diabetic: ?No results for input(s): HGBA1C in the last 72 hours. ?No results for input(s): GLUCAP in the last 168 hours. ?Cardiac Enzymes: ?No results for input(s): CKTOTAL, CKMB, CKMBINDEX, TROPONINI in the last 168 hours. ?No results for input(s): PROBNP in the last 8760 hours. ?Coagulation Profile: ?No results for input(s): INR, PROTIME in the last 168 hours. ?Thyroid Function Tests: ?No results for input(s): TSH, T4TOTAL, FREET4, T3FREE, THYROIDAB in the last 72 hours. ?Lipid Profile: ?No results for  input(s): CHOL, HDL, LDLCALC, TRIG, CHOLHDL, LDLDIRECT in the last 72 hours. ?Anemia Panel: ?No results for input(s): VITAMINB12, FOLATE, FERRITIN, TIBC, IRON, RETICCTPCT in the last 72 hours. ?Urine analy

## 2021-05-19 LAB — CBC
HCT: 36.4 % — ABNORMAL LOW (ref 39.0–52.0)
Hemoglobin: 11.7 g/dL — ABNORMAL LOW (ref 13.0–17.0)
MCH: 29 pg (ref 26.0–34.0)
MCHC: 32.1 g/dL (ref 30.0–36.0)
MCV: 90.3 fL (ref 80.0–100.0)
Platelets: 239 10*3/uL (ref 150–400)
RBC: 4.03 MIL/uL — ABNORMAL LOW (ref 4.22–5.81)
RDW: 13.2 % (ref 11.5–15.5)
WBC: 9.2 10*3/uL (ref 4.0–10.5)
nRBC: 0 % (ref 0.0–0.2)

## 2021-05-19 LAB — HEPARIN LEVEL (UNFRACTIONATED): Heparin Unfractionated: 0.55 IU/mL (ref 0.30–0.70)

## 2021-05-19 MED ORDER — METOCLOPRAMIDE HCL 5 MG/ML IJ SOLN: 5.0000 mg | Freq: Three times a day (TID) | INTRAMUSCULAR | Status: DC | PRN

## 2021-05-19 MED ORDER — SENNOSIDES-DOCUSATE SODIUM 8.6-50 MG PO TABS
1.0000 | ORAL_TABLET | Freq: Two times a day (BID) | ORAL | Status: DC
  Administered 2021-05-19 – 2021-05-20 (×3): 1 via ORAL
  Filled 2021-05-19 (×3): qty 1

## 2021-05-19 MED ORDER — APIXABAN 5 MG PO TABS
5.0000 mg | ORAL_TABLET | Freq: Two times a day (BID) | ORAL | Status: DC
  Administered 2021-05-19 – 2021-05-20 (×3): 5 mg via ORAL
  Filled 2021-05-19 (×3): qty 1

## 2021-05-19 MED ORDER — CHLORHEXIDINE GLUCONATE CLOTH 2 % EX PADS
6.0000 | MEDICATED_PAD | Freq: Every day | CUTANEOUS | Status: DC
  Administered 2021-05-19 – 2021-05-20 (×2): 6 via TOPICAL

## 2021-05-19 MED ORDER — POLYETHYLENE GLYCOL 3350 17 G PO PACK
17.0000 g | PACK | Freq: Two times a day (BID) | ORAL | Status: DC
Start: 2021-05-19 — End: 2021-05-20
  Administered 2021-05-19 – 2021-05-20 (×2): 17 g via ORAL
  Filled 2021-05-19 (×2): qty 1

## 2021-05-19 MED ORDER — BISACODYL 10 MG RE SUPP: 10.0000 mg | Freq: Every day | RECTAL | Status: DC | PRN

## 2021-05-19 NOTE — Progress Notes (Signed)
ANTICOAGULATION CONSULT NOTE ? ?Pharmacy Consult for heparin - changing to apixaban 4/29 ?Indication: pulmonary embolus ? ?Allergies  ?Allergen Reactions  ? Cheese Nausea And Vomiting  ? Lactose Intolerance (Gi) Diarrhea and Nausea And Vomiting  ? ? ?Patient Measurements: ?Height: '5\' 6"'$  (167.6 cm) ?Weight: 76.8 kg (169 lb 5 oz) ?IBW/kg (Calculated) : 63.8 ?Heparin Dosing Weight: 76.4kg ? ?Vital Signs: ?Temp: 98.8 ?F (37.1 ?C) (04/29 0449) ?Temp Source: Oral (04/29 0449) ?BP: 121/78 (04/29 0449) ?Pulse Rate: 49 (04/29 0449) ? ?Labs: ?Recent Labs  ?  05/17/21 ?0438 05/18/21 ?4098 05/19/21 ?0445  ?HGB 11.6* 11.2* 11.7*  ?HCT 35.5* 34.6* 36.4*  ?PLT 236 219 239  ?HEPARINUNFRC 0.42 0.67 0.55  ?CREATININE 1.41* 1.48*  --   ? ? ? ?Estimated Creatinine Clearance: 44.7 mL/min (A) (by C-G formula based on SCr of 1.48 mg/dL (H)). ? ? ?Medical History: ?Past Medical History:  ?Diagnosis Date  ? Arthritis   ? Asthma-COPD overlap syndrome ([place])   ? pulmonology--- dr n desai;   last exacerbation 05-08-2021  ? Benign localized prostatic hyperplasia with lower urinary tract symptoms (LUTS)   ? Chronic cough   ? due to copd  ? Dairy product intolerance   ? DOE (dyspnea on exertion)   ? d/t asthma/ COPD  ? Emphysema, unspecified ([place])   ? Full dentures   ? History of bladder cancer 2020  ? s/p TURBT w/ chemo instillation  ? History of COVID-19   ? per pt 2021 (no at same time of pneumonia)  w/ moderate symptoms , recovered at home, that resolved back to baseline  ? History of hepatitis C 2018  ? hx chronic hepatitis C,  per pt treated and told was cured  ? History of latent tuberculosis 2017  ? pt tested positive for TB exposure, not active, treated w/ INH for 9 months  ? History of MAI infection 2021  ? never treated  ? History of pulmonary embolism   ? (05-09-2021  pt stated never had clot prior to 2017 , none since 2020, had negative blood disorder work-up)//  2017  treated w/ xarelto 6 months//  and recurrent 04/ 2020 in setting  severe UTI and deconditioning w/ prolonged admission due to intolerant to anticoagulants IVC filter inserted  ? Hypertension   ? OSA (obstructive sleep apnea)   ? per pt dx yrs ago, intolerant to cpap  ? 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 S/P IVC filter 2020  ? for PE  ? Weak urinary stream   ? Wears glasses   ? ? ?Medications:  ?Infusions:  ? ? ? ?Assessment: ?3 yom presented to the ED with CP. Found to have new bilateral PE. Pt with history of PE but not currently anticoagulated due to issues with hematuria in the past while on Xarelto- IVC filter was placed in 2020. Hx of bladder cancer s/p TURP 05/11/21. Baseline CBC is WNL. Both upper and lower extremity doppler negative for DVT.  Pharmacy consulted to dose IV heparin. ? ?4/27 Renal US: small amount of debris seen within the urinary bladder, which ma be related to stasis or blood products ? ?Today, 05/19/2021: ?Per Md orders, change IV UFH to apixaban this AM ? ? ?Goal of Therapy:  ?Heparin level 0.3-0.7 units/ml ?Monitor platelets by anticoagulation protocol: Yes ?  ?Plan:  ?Discontinue IV UFH now ?With hematuria, will change apixaban to '5mg'$  BID dosing and forego '10mg'$  BID x 7 days dosing. Monitor closely for bleeding ? ? ?Adrian Saran, PharmD,  BCPS ?Secure Chat if ?s ?05/19/2021 7:47 AM ? ? ?

## 2021-05-19 NOTE — Progress Notes (Signed)
?Subjective: ?No acute events overnight. Foley placed yesterday by Dr Junious Silk.  ? ?Objective: ?Vital signs in last 24 hours: ?Temp:  [97.6 ?F (36.4 ?C)-98.8 ?F (37.1 ?C)] 98.8 ?F (37.1 ?C) (04/29 0449) ?Pulse Rate:  [49-58] 49 (04/29 0449) ?Resp:  [12-20] 20 (04/29 0449) ?BP: (111-121)/(68-78) 121/78 (04/29 0449) ?SpO2:  [95 %-100 %] 98 % (04/29 0449) ? ?Intake/Output from previous day: ?04/28 0701 - 04/29 0700 ?In: 290.8 [I.V.:290.8] ?Out: 2050 [Urine:2050] ?Intake/Output this shift: ?Total I/O ?In: -  ?Out: 1500 [Urine:1500] ? ?Physical Exam:  ?GEN: NAD, Aox3 ?GU: foley draining cherry/dark cherry red ? ? ? ?Lab Results: ?Recent Labs  ?  05/17/21 ?0438 05/18/21 ?1610 05/19/21 ?0445  ?HGB 11.6* 11.2* 11.7*  ?HCT 35.5* 34.6* 36.4*  ? ? ?BMET ?Recent Labs  ?  05/17/21 ?0438 05/18/21 ?0440  ?NA 139 137  ?K 4.2 4.1  ?CL 111 108  ?CO2 24 24  ?GLUCOSE 89 96  ?BUN 16 18  ?CREATININE 1.41* 1.48*  ?CALCIUM 8.8* 8.9  ? ? ?No results for input(s): LABPT, INR in the last 72 hours. ?No results for input(s): LABURIN in the last 72 hours. ?Results for orders placed or performed during the hospital encounter of 05/11/21  ?Resp Panel by RT-PCR (Flu A&B, Covid) Nasopharyngeal Swab     Status: None  ? Collection Time: 05/09/21  1:09 PM  ? Specimen: Nasopharyngeal Swab; Nasopharyngeal(NP) swabs in vial transport medium  ?Result Value Ref Range Status  ? SARS Coronavirus 2 by RT PCR NEGATIVE NEGATIVE Final  ?  Comment: (NOTE) ?SARS-CoV-2 target nucleic acids are NOT DETECTED. ? ?The SARS-CoV-2 RNA is generally detectable in upper respiratory ?specimens during the acute phase of infection. The lowest ?concentration of SARS-CoV-2 viral copies this assay can detect is ?138 copies/mL. A negative result does not preclude SARS-Cov-2 ?infection and should not be used as the sole basis for treatment or ?other patient management decisions. A negative result may occur with  ?improper specimen collection/handling, submission of specimen  other ?than nasopharyngeal swab, presence of viral mutation(s) within the ?areas targeted by this assay, and inadequate number of viral ?copies(<138 copies/mL). A negative result must be combined with ?clinical observations, patient history, and epidemiological ?information. The expected result is Negative. ? ?Fact Sheet for Patients:  ?EntrepreneurPulse.com.au ? ?Fact Sheet for Healthcare Providers:  ?IncredibleEmployment.be ? ?This test is no t yet approved or cleared by the [place] FDA and  ?has been authorized for detection and/or diagnosis of SARS-CoV-2 by ?FDA under an Emergency Use Authorization (EUA). This EUA will remain  ?in effect (meaning this test can be used) for the duration of the ?COVID-19 declaration under Section 564(b)(1) of the Act, 21 ?U.S.C.section 360bbb-3(b)(1), unless the authorization is terminated  ?or revoked sooner.  ? ? ?  ? Influenza A by PCR NEGATIVE NEGATIVE Final  ? Influenza B by PCR NEGATIVE NEGATIVE Final  ?  Comment: (NOTE) ?The Xpert Xpress SARS-CoV-2/FLU/RSV plus assay is intended as an aid ?in the diagnosis of influenza from Nasopharyngeal swab specimens and ?should not be used as a sole basis for treatment. Nasal washings and ?aspirates are unacceptable for Xpert Xpress SARS-CoV-2/FLU/RSV ?testing. ? ?Fact Sheet for Patients: ?EntrepreneurPulse.com.au ? ?Fact Sheet for Healthcare Providers: ?IncredibleEmployment.be ? ?This test is not yet approved or cleared by the [place] FDA and ?has been authorized for detection and/or diagnosis of SARS-CoV-2 by ?FDA under an Emergency Use Authorization (EUA). This EUA will remain ?in effect (meaning this test can be used) for the duration of  the ?COVID-19 declaration under Section 564(b)(1) of the Act, 21 U.S.C. ?section 360bbb-3(b)(1), unless the authorization is terminated or ?revoked. ? ?Performed at [facility], [address] ?[place], [place] [address] ?  ? ? ?Studies/Results: ?DG Chest 2 View ? ?Result Date: 05/18/2021 ?CLINICAL DATA:  Chest pain.  Recent diagnosis of pulmonary embolism. EXAM: CHEST - 2 VIEW COMPARISON:  05/15/2021; chest CT-05/15/2021 FINDINGS: Grossly unchanged cardiac silhouette and mediastinal contours with atherosclerotic plaque within the thoracic aorta. The lungs appear hyperexpanded with flattening diaphragms and mild diffuse slightly nodular thickening of the pulmonary interstitium. Right mid lung linear heterogeneous opacities are unchanged. Minimal right basilar heterogeneous opacities unchanged. No definite pleural effusion or pneumothorax. No acute osseous abnormalities. Suspected old/healed right eighth rib fracture. IMPRESSION: Similar findings of lung hyperexpansion and right basilar heterogeneous opacities, atelectasis versus infiltrate. Electronically Signed   By: Sandi Mariscal M.D.   On: 05/18/2021 11:05  ? ?US RENAL ? ?Result Date: 05/17/2021 ?CLINICAL DATA:  Gross hematuria EXAM: RENAL / URINARY TRACT ULTRASOUND COMPLETE COMPARISON:  CT 05/15/2021 FINDINGS: Right Kidney: Renal measurements: 8.8 x 4.4 x 5.8 cm = volume: 117 mL. Echogenicity within normal limits. 2.4 cm exophytic cyst emanating from the interpolar region of the right kidney. No shadowing stone or hydronephrosis visualized. Left Kidney: Renal measurements: 10.7 x 3.8 x 4.2 cm = volume: 89 mL. Echogenicity within normal limits. No mass, shadowing stone, or hydronephrosis visualized. Bladder: Moderate-sized narrow-necked diverticulum emanates from the posterosuperior aspect of the bladder, as seen on recent CT. Small amount of debris within the bladder dependently. Other: None. IMPRESSION: 1. Urinary bladder diverticulum, as seen on recent CT. 2. A small amount of debris is seen within the urinary bladder, which may be related to stasis or blood products. 3. No evidence of obstructive uropathy. Electronically Signed   By: Davina Poke  D.O.   On: 05/17/2021 15:37   ? ?Assessment/Plan: ?1) gross hematuria-status post TURP with bleeding on heparin drip for PE.   ? ?2) BPH status post TURP.  This was not an extensive TURP.  Patient had prior TURP after prostatic urethral lift. ? 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  This was a cold cup biopsy in a right diverticulum.   ? ?-Urine a little darker this morning. I discussed with patient and feel it would be prudent to hold his discharge for now. Will continue to follow ? ? ? ? ? ? LOS: 2 days  ? ?Am[facility]e [facility] ?05/19/2021, 6:56 AM ? ? ? ?

## 2021-05-19 NOTE — Progress Notes (Signed)
?PROGRESS NOTE ? ?James Barker QRF:[id number] DOB: 1950/11/30  ? ?PCP: Lois Huxley, PA ? ?Patient is from: Home ? ?DOA: 05/15/2021 LOS: 2 ? ?Chief complaints ?Chief Complaint  ?Patient presents with  ? Chest Pain  ?  ? ?Brief Narrative / Interim history: ?71 year old M with PMH of asthma/COPD overlap syndrome, bladder cancer s/p TURBT and chemo in [place], VTE s/p IVC due to hematuria on anticoagulation, recent TURP on 05/11/2021 presenting with general malaise, nausea, vomiting, atypical chest pain and lower extremity swelling, and admitted for acute segmental and subsegmental PE.  Started on IV heparin.  Vascular surgery and urology consulted. ? ?Patient seems to have ongoing gross hematuria on anticoagulation although hemoglobin seems to be stable after initial drop.  Urology on board. ?   ? ?Subjective: ?Seen and examined earlier this morning.  No major events overnight of this morning.  He is very concerned about ongoing gross hematuria.  Anxious to go home.  Has not a bowel movement yet.  ? ?Objective: ?Vitals:  ? 05/19/21 0904 05/19/21 0905 05/19/21 0906 05/19/21 0930  ?BP:    106/64  ?Pulse:    (!) 49  ?Resp:    20  ?Temp:    98.4 ?F (36.9 ?C)  ?TempSrc:    Oral  ?SpO2: 98% 98% 98%   ?Weight:      ?Height:      ? ? ?Examination: ? ?GENERAL: No apparent distress.  Nontoxic. ?HEENT: MMM.  Vision and hearing grossly intact.  ?NECK: Supple.  No apparent JVD.  ?RESP:  No IWOB.  Fair aeration bilaterally. ?CVS:  RRR. Heart sounds normal.  ?ABD/GI/GU: BS+. Abd soft, NTND.  Foley in place.  Dark red urine.  No blood clots ?MSK/EXT:  Moves extremities. No apparent deformity. No edema.  ?SKIN: no apparent skin lesion or wound ?NEURO: Awake and alert. Oriented appropriately.  No apparent focal neuro deficit. ?PSYCH: Calm. Normal affect.  ? ?Procedures:  ?None ? ?Microbiology summarized: ?None ? ?Assessment and Plan: ?* Bilateral pulmonary embolism ([place]) ?CT with segmental to subsegmental embolus in the RUL, RLL and  LLL. PESI class IV, high risk (4-11.4% 30 day mortality) based on age, male, hx cancer, hx lung disease. Has infrarenal IVC filter in place (placed ~3 years ago in [place] when he couldn't tolerate anticoagulation for PE's due to hematuria with bladder cancer). Admitting provider discussed with vascular surgery.  Per vascular surgery, IVC in the right place.  Vascular surgery recommended UE Doppler, which is negative.  TTE without significant finding.  Stable on room air. ?-Still with gross hematuria although H&H seems to be stable.  ?-Transitioned to p.o. Eliquis after discussion with urology ? ?Gross hematuria ?Patient with recent TURP on 05/11/2021.  Pathology with urothelial carcinoma in situ.  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 ?Recent Labs  ?  04/09/21 ?1051 05/11/21 ?0627 05/15/21 ?1105 05/16/21 ?8264 05/17/21 ?1583 05/18/21 ?0940 05/19/21 ?0445  ?HGB 13.3 12.6* 13.9 12.3* 11.6* 11.2* 11.7*  ?-Foley catheter placed on 4/28 ?-Urology following ? ?CKD-3A ?Recent Labs  ?  05/11/21 ?0627 05/15/21 ?1105 05/16/21 ?0628 05/17/21 ?0438 05/18/21 ?0440  ?BUN 45* '19 21 16 18  '$ ?CREATININE 1.70* 1.36* 1.36* 1.41* 1.48*  ?Stable.  Continue monitoring ? ? ?BPH (benign prostatic hyperplasia) ?S/p limited TURP on 4/21. Per urology, this was a limited TURP as he had a prior UroLift and TURP.  ?CT without acute findings, large diverticulum of posterior bladder dome containing small air fluid level presumably  due to recent catheterization ?-Per urology. ? 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 Had fever with BCG and recurrence after intravesical mitomycin C.   ?-Urology planning referral to Dr. Alen Blew to consider options on discharge. ?-Continue home p.o. Keflex ? ?Presence of IVC filter ?Placed about 3 years ago in [place] due to hematuria in setting of bladder cancer with history of blood clots ?Should follow up  with vascular outpatient ? ?Nausea & vomiting ?CT without acute findings. No emesis but reports nausea ?-IV Reglan as needed ? ?Chest pain ?Atypical chest pain with cough likely from PE.  He also have tenderness to palpation suggesting musculoskeletal etiology.  Two view CXR without acute finding.  Serial troponin negative.  TTE reassuring. ?-Continue Mucinex DM ?-Incentive spirometry ?-Tylenol, tramadol and oxycodone as needed based on severity ?-Lidoderm patch to left chest ?-Bowel regimen ? ?Hypocalcemia ?Resolved. ? ?Hypertension ?Amlodipine, losartan ? ?Allergic Asthma COPD Overlap Syndrome ?Follows with Dr. Shearon Stalls. On dupixent at home.  CT with bilateral bronchial wall thickening c/w infectious or inflammatory bronchitis  ?Continue albuterol, advair, spiriva ? ? ?Insomnia ?Melatonin at bedtime ? ?Constipation ?Scheduled MiraLAX and Senokot-S. ? ? ?DVT prophylaxis:  ?apixaban (ELIQUIS) tablet 5 mg  ? ?Code Status: Full code ?Family Communication: Patient and/or RN. Available if any question.  ?Level of care: Med-Surg ?Status is: Inpatient ?The patient will remain inpatient because: Gross hematuria ? ? ?Final disposition: Likely home once cleared by urology ? ?Consultants:  ?Urology ? ?Sch Meds:  ?Scheduled Meds: ? albuterol  2.5 mg Nebulization TID  ? amLODipine  7.5 mg Oral Daily  ? apixaban  5 mg Oral BID  ? atorvastatin  20 mg Oral Daily  ? Chlorhexidine Gluconate Cloth  6 each Topical Daily  ? lidocaine  1 patch Transdermal Q24H  ? losartan  50 mg Oral Daily  ? melatonin  5 mg Oral QHS  ? mometasone-formoterol  2 puff Inhalation BID  ? nystatin  5 mL Oral QID  ? senna-docusate  1 tablet Oral BID  ? umeclidinium bromide  1 puff Inhalation Daily  ? ?Continuous Infusions: ? ? ?PRN Meds:.acetaminophen, albuterol, guaiFENesin-dextromethorphan, metoCLOPramide (REGLAN) injection, oxyCODONE, polyethylene glycol, traMADol ? ?Antimicrobials: ?Anti-infectives (From admission, onward)  ? ? Start     Dose/Rate Route  Frequency Ordered Stop  ? 05/15/21 2200  cephALEXin (KEFLEX) capsule 500 mg       ? 500 mg Oral Daily at bedtime 05/15/21 1745 05/18/21 2136  ? ?  ? ? ? ?I have personally reviewed the following labs and images: ?CBC: ?Recent Labs  ?Lab 05/15/21 ?1105 05/16/21 ?0628 05/17/21 ?0438 05/18/21 ?3329 05/19/21 ?0445  ?WBC 7.9 8.0 6.9 7.3 9.2  ?NEUTROABS 4.8  --   --   --   --   ?HGB 13.9 12.3* 11.6* 11.2* 11.7*  ?HCT 41.9 36.9* 35.5* 34.6* 36.4*  ?MCV 87.3 88.3 89.4 90.8 90.3  ?PLT 291 257 236 219 239  ? ?BMP &GFR ?Recent Labs  ?Lab 05/15/21 ?1105 05/16/21 ?0628 05/17/21 ?0438 05/18/21 ?0440  ?NA 140 136 139 137  ?K 4.1 4.3 4.2 4.1  ?CL 104 106 111 108  ?CO2 '27 24 24 24  '$ ?GLUCOSE 95 91 89 96  ?BUN '19 21 16 18  '$ ?CREATININE 1.36* 1.36* 1.41* 1.48*  ?CALCIUM 10.4* 8.8* 8.8* 8.9  ?MG  --   --  2.1 2.0  ?PHOS  --   --  3.8 4.1  ? ?Estimated Creatinine  Clearance: 44.7 mL/min (A) (by C-G formula based on SCr of 1.48 mg/dL (H)). ?Liver & Pancreas: ?Recent Labs  ?Lab 05/15/21 ?1105 05/16/21 ?0628 05/17/21 ?0438 05/18/21 ?0440  ?AST 15 16  --   --   ?ALT 10 9  --   --   ?ALKPHOS 52 46  --   --   ?BILITOT 0.7 0.7  --   --   ?PROT 7.7 6.3*  --   --   ?ALBUMIN 4.3 3.4* 3.3* 3.2*  ? ?Recent Labs  ?Lab 05/15/21 ?1105  ?LIPASE 61*  ? ?No results for input(s): AMMONIA in the last 168 hours. ?Diabetic: ?No results for input(s): HGBA1C in the last 72 hours. ?No results for input(s): GLUCAP in the last 168 hours. ?Cardiac Enzymes: ?No results for input(s): CKTOTAL, CKMB, CKMBINDEX, TROPONINI in the last 168 hours. ?No results for input(s): PROBNP in the last 8760 hours. ?Coagulation Profile: ?No results for input(s): INR, PROTIME in the last 168 hours. ?Thyroid Function Tests: ?No results for input(s): TSH, T4TOTAL, FREET4, T3FREE, THYROIDAB in the last 72 hours. ?Lipid Profile: ?No results for input(s): CHOL, HDL, LDLCALC, TRIG, CHOLHDL, LDLDIRECT in the last 72 hours. ?Anemia Panel: ?No results for input(s): VITAMINB12, FOLATE, FERRITIN,  TIBC, IRON, RETICCTPCT in the last 72 hours. ?Urine analysis: ?No results found for: COLORURINE, APPEARANCEUR, LABSPEC, PHURINE, GLUCOSEU, HGBUR, BILIRUBINUR, KETONESUR, PROTEINUR, UROBILINOGEN, NITRITE, LEU

## 2021-05-20 DIAGNOSIS — R112 Nausea with vomiting, unspecified: Secondary | ICD-10-CM

## 2021-05-20 DIAGNOSIS — I10 Essential (primary) hypertension: Secondary | ICD-10-CM

## 2021-05-20 LAB — CBC
HCT: 33 % — ABNORMAL LOW (ref 39.0–52.0)
Hemoglobin: 10.8 g/dL — ABNORMAL LOW (ref 13.0–17.0)
MCH: 29.8 pg (ref 26.0–34.0)
MCHC: 32.7 g/dL (ref 30.0–36.0)
MCV: 91.2 fL (ref 80.0–100.0)
Platelets: 205 10*3/uL (ref 150–400)
RBC: 3.62 MIL/uL — ABNORMAL LOW (ref 4.22–5.81)
RDW: 13.2 % (ref 11.5–15.5)
WBC: 8.6 10*3/uL (ref 4.0–10.5)
nRBC: 0 % (ref 0.0–0.2)

## 2021-05-20 LAB — RENAL FUNCTION PANEL
Albumin: 3.3 g/dL — ABNORMAL LOW (ref 3.5–5.0)
Anion gap: 6 (ref 5–15)
BUN: 13 mg/dL (ref 8–23)
CO2: 27 mmol/L (ref 22–32)
Calcium: 9.1 mg/dL (ref 8.9–10.3)
Chloride: 106 mmol/L (ref 98–111)
Creatinine, Ser: 1.58 mg/dL — ABNORMAL HIGH (ref 0.61–1.24)
GFR, Estimated: 46 mL/min — ABNORMAL LOW (ref >60)
Glucose, Bld: 81 mg/dL (ref 70–99)
Phosphorus: 4.2 mg/dL (ref 2.5–4.6)
Potassium: 5 mmol/L (ref 3.5–5.1)
Sodium: 139 mmol/L (ref 135–145)

## 2021-05-20 LAB — MAGNESIUM: Magnesium: 2.5 mg/dL — ABNORMAL HIGH (ref 1.7–2.4)

## 2021-05-20 MED ORDER — APIXABAN 5 MG PO TABS
5.0000 mg | ORAL_TABLET | Freq: Two times a day (BID) | ORAL | 1 refills | Status: DC
Start: 2021-05-20 — End: 2021-07-19

## 2021-05-20 MED ORDER — AMLODIPINE BESYLATE 10 MG PO TABS
10.0000 mg | ORAL_TABLET | Freq: Every day | ORAL | Status: DC
  Administered 2021-05-20: 10 mg via ORAL
  Filled 2021-05-20: qty 1

## 2021-05-20 MED ORDER — POLYETHYLENE GLYCOL 3350 17 GM/SCOOP PO POWD: 17.0000 g | Freq: Two times a day (BID) | ORAL | 0 refills | Status: DC | PRN

## 2021-05-20 MED ORDER — AMLODIPINE BESYLATE 10 MG PO TABS: 10.0000 mg | ORAL_TABLET | Freq: Every day | ORAL | 1 refills | Status: DC

## 2021-05-20 MED ORDER — ALBUTEROL SULFATE (2.5 MG/3ML) 0.083% IN NEBU: 2.5000 mg | INHALATION_SOLUTION | Freq: Two times a day (BID) | RESPIRATORY_TRACT | Status: DC

## 2021-05-20 MED ORDER — SENNOSIDES-DOCUSATE SODIUM 8.6-50 MG PO TABS: 1.0000 | ORAL_TABLET | Freq: Two times a day (BID) | ORAL | 0 refills | Status: DC | PRN

## 2021-05-20 MED ORDER — OXYCODONE HCL 5 MG PO TABS: 5.0000 mg | ORAL_TABLET | Freq: Three times a day (TID) | ORAL | 0 refills | Status: AC | PRN

## 2021-05-20 NOTE — Progress Notes (Signed)
Subjective: ?No acute events overnight.  ? ?Objective: ?Vital signs in last 24 hours: ?Temp:  [98.2 ?F (36.8 ?C)-98.7 ?F (37.1 ?C)] 98.7 ?F (37.1 ?C) (04/30 9476) ?Pulse Rate:  [48-62] 57 (04/30 0336) ?Resp:  [19-20] 20 (04/30 0336) ?BP: (104-119)/(64-81) 104/67 (04/30 0336) ?SpO2:  [96 %-100 %] 97 % (04/30 0724) ?Weight:  [74.4 kg] 74.4 kg (04/30 0342) ? ?Intake/Output from previous day: ?04/29 0701 - 04/30 0700 ?In: 480 [P.O.:480] ?Out: 1900 [Urine:1900] ?Intake/Output this shift: ?No intake/output data recorded. ? ?Physical Exam:  ?GEN: NAD, Aox3 ?GU: foley draining clear/yellow urine ? ? ? ?Lab Results: ?Recent Labs  ?  05/18/21 ?5465 05/19/21 ?0445 05/20/21 ?0423  ?HGB 11.2* 11.7* 10.8*  ?HCT 34.6* 36.4* 33.0*  ? ?BMET ?Recent Labs  ?  05/18/21 ?0354 05/20/21 ?0423  ?NA 137 139  ?K 4.1 5.0  ?CL 108 106  ?CO2 24 27  ?GLUCOSE 96 81  ?BUN 18 13  ?CREATININE 1.48* 1.58*  ?CALCIUM 8.9 9.1  ? ?No results for input(s): LABPT, INR in the last 72 hours. ?No results for input(s): LABURIN in the last 72 hours. ?Results for orders placed or performed during the hospital encounter of 05/11/21  ?Resp Panel by RT-PCR (Flu A&B, Covid) Nasopharyngeal Swab     Status: None  ? Collection Time: 05/09/21  1:09 PM  ? Specimen: Nasopharyngeal Swab; Nasopharyngeal(NP) swabs in vial transport medium  ?Result Value Ref Range Status  ? SARS Coronavirus 2 by RT PCR NEGATIVE NEGATIVE Final  ?  Comment: (NOTE) ?SARS-CoV-2 target nucleic acids are NOT DETECTED. ? ?The SARS-CoV-2 RNA is generally detectable in upper respiratory ?specimens during the acute phase of infection. The lowest ?concentration of SARS-CoV-2 viral copies this assay can detect is ?138 copies/mL. A negative result does not preclude SARS-Cov-2 ?infection and should not be used as the sole basis for treatment or ?other patient management decisions. A negative result may occur with  ?improper specimen collection/handling, submission of specimen other ?than nasopharyngeal  swab, presence of viral mutation(s) within the ?areas targeted by this assay, and inadequate number of viral ?copies(<138 copies/mL). A negative result must be combined with ?clinical observations, patient history, and epidemiological ?information. The expected result is Negative. ? ?Fact Sheet for Patients:  ?EntrepreneurPulse.com.au ? ?Fact Sheet for Healthcare Providers:  ?IncredibleEmployment.be ? ?This test is no t yet approved or cleared by the [place] FDA and  ?has been authorized for detection and/or diagnosis of SARS-CoV-2 by ?FDA under an Emergency Use Authorization (EUA). This EUA will remain  ?in effect (meaning this test can be used) for the duration of the ?COVID-19 declaration under Section 564(b)(1) of the Act, 21 ?U.S.C.section 360bbb-3(b)(1), unless the authorization is terminated  ?or revoked sooner.  ? ? ?  ? Influenza A by PCR NEGATIVE NEGATIVE Final  ? Influenza B by PCR NEGATIVE NEGATIVE Final  ?  Comment: (NOTE) ?The Xpert Xpress SARS-CoV-2/FLU/RSV plus assay is intended as an aid ?in the diagnosis of influenza from Nasopharyngeal swab specimens and ?should not be used as a sole basis for treatment. Nasal washings and ?aspirates are unacceptable for Xpert Xpress SARS-CoV-2/FLU/RSV ?testing. ? ?Fact Sheet for Patients: ?EntrepreneurPulse.com.au ? ?Fact Sheet for Healthcare Providers: ?IncredibleEmployment.be ? ?This test is not yet approved or cleared by the [place] FDA and ?has been authorized for detection and/or diagnosis of SARS-CoV-2 by ?FDA under an Emergency Use Authorization (EUA). This EUA will remain ?in effect (meaning this test can be used) for the duration of the ?COVID-19 declaration under Section  564(b)(1) of the Act, 21 U.S.C. ?section 360bbb-3(b)(1), unless the authorization is terminated or ?revoked. ? ?Performed at [facility], [address] ?[place],  [address] ?   ? ? ?Studies/Results: ?DG Chest 2 View ? ?Result Date: 05/18/2021 ?CLINICAL DATA:  Chest pain.  Recent diagnosis of pulmonary embolism. EXAM: CHEST - 2 VIEW COMPARISON:  05/15/2021; chest CT-05/15/2021 FINDINGS: Grossly unchanged cardiac silhouette and mediastinal contours with atherosclerotic plaque within the thoracic aorta. The lungs appear hyperexpanded with flattening diaphragms and mild diffuse slightly nodular thickening of the pulmonary interstitium. Right mid lung linear heterogeneous opacities are unchanged. Minimal right basilar heterogeneous opacities unchanged. No definite pleural effusion or pneumothorax. No acute osseous abnormalities. Suspected old/healed right eighth rib fracture. IMPRESSION: Similar findings of lung hyperexpansion and right basilar heterogeneous opacities, atelectasis versus infiltrate. Electronically Signed   By: Sandi Mariscal M.D.   On: 05/18/2021 11:05   ? ?Assessment/Plan: ?1) gross hematuria-status post TURP with bleeding on heparin drip for PE. Changed to eliquis yesterday ? ?2) BPH status post TURP.  This was not an extensive TURP.  Patient had prior TURP after prostatic urethral lift. ? 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  This was a cold cup biopsy in a right diverticulum.   ? ?-urine much improved this morning. OK to d/c catheter and from GU standpoint OK to go home ? ? ? ? LOS: 3 days  ? ?James Barker ?05/20/2021, 9:15 AM ? ? ? ?

## 2021-05-20 NOTE — TOC Transition Note (Signed)
Transition of Care (TOC) - CM/SW Discharge Note ? ? ?Patient Details  ?Name: James Barker ?MRN: [id number] ?Date of Birth: 1950-08-09 ? ?Transition of Care (TOC) CM/SW Contact:  ?Ross Ludwig, LCSW ?Phone Number: ?05/20/2021, 9:59 AM ? ? ?Clinical Narrative:    ? ?Patient will be discharging back home today.  Per MD patient does not have any needs.  CSW signing off, please reconsult for social work needs. ? ? ?Final next level of care: Home/Self Care ?Barriers to Discharge: Barriers Resolved ? ? ?Patient Goals and CMS Choice ?Patient states their goals for this hospitalization and ongoing recovery are:: To return back home. ?CMS Medicare.gov Compare Post Acute Care list provided to:: Patient ?Choice offered to / list presented to : Patient ? ?Discharge Placement ?  ?           ?  ?  ?  ?  ? ?Discharge Plan and Services ?  ?Discharge Planning Services: CM Consult ?Post Acute Care Choice: NA          ?  ?  ?  ?  ?  ?  ?  ?  ?  ?  ? ?Social Determinants of Health (SDOH) Interventions ?  ? ? ?Readmission Risk Interventions ?   ? View : No data to display.  ?  ?  ?  ? ? ? ? ? ?

## 2021-05-20 NOTE — Discharge Summary (Signed)
? ?Physician Discharge Summary  ?James Barker AJO:[id number] DOB: 08/22/1950 DOA: 05/15/2021 ? ?PCP: Lois Huxley, PA ? ?Admit date: 05/15/2021 ?Discharge date: 05/20/2021 ?Admitted From: Home ?Disposition: Home ?Recommendations for Outpatient Follow-up:  ?Follow with PCP in 1 to 2 weeks ?Urology to arrange outpatient follow-up and referral to oncology ?Reassess blood pressure, CBC and renal function at follow-up. ?Please follow up on the following pending results: None ? ?Home Health: Not indicated ?Equipment/Devices: Not indicated ? ?Discharge Condition: Stable ?CODE STATUS: Full code ? Follow-up Information   ? ? Lois Huxley, PA. Schedule an appointment as soon as possible for a visit in 1 week(s).   ?Specialty: Family Medicine ?Contact information: ?[address] ?[address] ?[place] [id number] ?[phone] ? ? ?  ?  ? ?  ?  ? ?  ? ? ?Hospital course ?71 year old M with PMH of asthma/COPD overlap syndrome, bladder cancer s/p TURBT and chemo in [place], VTE s/p IVC due to hematuria on anticoagulation, recent TURP on 05/11/2021 presenting with general malaise, nausea, vomiting, atypical chest pain and lower extremity swelling, and admitted for acute segmental and subsegmental PE.  Started on IV heparin.  Vascular surgery and urology consulted.  ? ?Vascular surgery recommended upper extremity Dopplers that were negative for DVT.  Etiology followed patient throughout hospitalization. ? ?Prolonged hospitalization due to gross hematuria on anticoagulation.  Eventually, hematuria improved and he was cleared for discharge by urology on p.o. Eliquis.  Urology to arrange outpatient follow-up and referred to oncology.  ? ? ?   ? ?See individual problem list below for more on hospital course. ? ?Problems addressed during this hospitalization ?Problem  ?Bilateral Pulmonary Embolism ([facility])  ?Gross Hematuria  ?CKD-3A  ?History of bladder cancer s/p TURBT and Chemo in [place]  ?Bph (Benign Prostatic Hyperplasia)  ?Chest Pain   ?Hypertension  ?Allergic Asthma COPD Overlap Syndrome  ?Insomnia  ?Constipation (Resolved)  ?Nausea & Vomiting (Resolved)  ?Hypocalcemia (Resolved)  ?  ?Assessment and Plan: ?* Bilateral pulmonary embolism ([place]) ?CT with segmental to subsegmental embolus in the RUL, RLL and LLL. PESI class IV, high risk (4-11.4% 30 day mortality) based on age, male, hx cancer, hx lung disease. Has infrarenal IVC filter in place (placed ~3 years ago in [place] when he couldn't tolerate anticoagulation for PE's due to hematuria with bladder cancer). Admitting provider discussed with vascular surgery.  Per vascular surgery, IVC in the right place.  Vascular surgery recommended UE Doppler, which is negative.  TTE without significant finding.  Stable on room air.  Prolonged hospitalization due to gross hematuria on anticoagulation.  Eventually, hematuria improved.  He was transitioned to Eliquis and discharged on Eliquis. ? ?Gross hematuria ?Patient with recent TURP on 05/11/2021.  Pathology with urothelial carcinoma in situ.  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 ?Recent Labs  ?  04/09/21 ?1051 05/11/21 ?0627 05/15/21 ?1105 05/16/21 ?6546 05/17/21 ?5035 05/18/21 ?4656 05/19/21 ?0445 05/20/21 ?0423  ?HGB 13.3 12.6* 13.9 12.3* 11.6* 11.2* 11.7* 10.8*  ?Foley catheter placed on 4/28.  Significant improvement in his hematuria.  Cleared for discharge by urology. ?Urology to arrange outpatient follow-up ? ?CKD-3A ?Recent Labs  ?  05/11/21 ?0627 05/15/21 ?1105 05/16/21 ?0628 05/17/21 ?0438 05/18/21 ?8127 05/20/21 ?0423  ?BUN 45* '19 21 16 18 13  '$ ?CREATININE 1.70* 1.36* 1.36* 1.41* 1.48* 1.58*  ?Discontinue losartan with hope of getting some renal function back ?Increased amlodipine from 7.5 to 10 mg for hypertension ? ? ?BPH (benign prostatic  hyperplasia) ?S/p limited TURP on 4/21. Per urology, this was a limited TURP as he had a prior UroLift and TURP.  ?CT without acute findings, large  diverticulum of posterior bladder dome containing small air fluid level presumably due to recent catheterization ?-Per urology. ? 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 Had fever with BCG and recurrence after intravesical mitomycin C.   ?-Urology planning referral to Dr. Alen Blew to consider options on discharge. ?-Continue home p.o. Keflex ? ?Presence of IVC filter ?Placed about 3 years ago in [place] due to hematuria in setting of bladder cancer with history of blood clots ?Should follow up with vascular outpatient ? ?Chest pain ?Atypical chest pain with cough likely from PE.  He also have tenderness to palpation suggesting musculoskeletal etiology.  CXR without acute finding.  ACS work-up negative. ?-Tylenol and oxycodone as needed ? ?Hypertension ?Discontinued losartan with hope of getting some renal function back ?Increased amlodipine from 7.5 to 10 mg daily ? ?Allergic Asthma COPD Overlap Syndrome ?Follows with Dr. Shearon Stalls. CT with bilateral bronchial wall thickening c/w infectious or inflammatory bronchitis  ?Continue home medications. ? ? ?Insomnia ?Melatonin at bedtime ? ?Constipation-resolved as of 05/20/2021 ?Resolved.  Senokot-S and MiraLAX as needed ? ?Hypocalcemia-resolved as of 05/20/2021 ?Resolved. ? ?Nausea & vomiting-resolved as of 05/20/2021 ?CT without acute findings.  Resolved. ? ? ? ? ?  ?  ?  ?  ? ?  ? ?Vital signs ?Vitals:  ? 05/20/21 3419 05/20/21 0720 05/20/21 6222 05/20/21 0724  ?BP:      ?Pulse:      ?Temp:      ?Resp:      ?Height:      ?Weight: 74.4 kg     ?SpO2:  97% 97% 97%  ?TempSrc:      ?BMI (Calculated): 26.48     ?  ? ?Discharge exam ? ?GENERAL: No apparent distress.  Nontoxic. ?HEENT: MMM.  Vision and hearing grossly intact.  ?NECK: Supple.  No apparent JVD.  ?RESP:  No IWOB.  Fair aeration bilaterally. ?CVS:  RRR. Heart sounds normal.  ?ABD/GI/GU: BS+. Abd soft, NTND.  Somewhat clear urine with slight blood tinge in  Foley bag ?MSK/EXT:  Moves extremities. No apparent deformity. No edema.  ?SKIN: no apparent skin lesion or wound ?NEURO: Awake and alert. Oriented appropriately.  No apparent focal neuro deficit. ?PSYCH: Calm. Normal affect.  ? ?Discharge Instructions ?Discharge Instructions   ? ? Call MD for:  difficulty breathing, headache or visual disturbances   Complete by: As directed ?  ? Call MD for:  extreme fatigue   Complete by: As directed ?  ? Call MD for:  severe uncontrolled pain   Complete by: As directed ?  ? Call MD for:  temperature >100.4   Complete by: As directed ?  ? Diet - low sodium heart healthy   Complete by: As directed ?  ? Discharge instructions   Complete by: As directed ?  ? It has been a pleasure taking care of you! ? ?You were hospitalized due to pulmonary embolism (blood clots in your lung) for which you have been started on blood thinner.  We are discharging you on Eliquis for further treatment of pulmonary embolism.  Your hematuria (blood in urine) seems to have improved. Follow-up with your primary care doctor in 1 to 2 weeks or sooner if needed.  Follow-up with urologist per their recommendation.  ? ?Please avoid  any over-the-counter pain medication other than plain Tylenol while taking blood thinner. ? ?Review your new medication list and the directions on your medications before you take them. ? ? ?Take care,  ? Increase activity slowly   Complete by: As directed ?  ? ?  ? ?Allergies as of 05/20/2021   ? ?   Reactions  ? Cheese Nausea And Vomiting  ? Lactose Intolerance (gi) Diarrhea, Nausea And Vomiting  ? ?  ? ?  ?Medication List  ?  ? ?STOP taking these medications   ? ?ibuprofen 400 MG tablet ?Commonly known as: ADVIL ?  ?losartan 50 MG tablet ?Commonly known as: COZAAR ?  ? ?  ? ?TAKE these medications   ? ?Advair HFA 230-21 MCG/ACT inhaler ?Generic drug: fluticasone-salmeterol ?Inhale 2 puffs into the lungs 2 (two) times daily. ?  ?albuterol (2.5 MG/3ML) 0.083% nebulizer  solution ?Commonly known as: PROVENTIL ?Take 3 mLs (2.5 mg total) by nebulization every 6 (six) hours as needed for wheezing or shortness of breath. ?What changed: Another medication with the same name was changed. Make sure you un

## 2021-05-20 NOTE — Progress Notes (Signed)
AVS given to patient and explained at the bedside. Medications and follow up appointments have been explained with pt verbalizing understanding.  

## 2021-05-21 ENCOUNTER — Other Ambulatory Visit: Payer: Self-pay

## 2021-05-21 ENCOUNTER — Ambulatory Visit
Admission: RE | Admit: 2021-05-21 | Discharge: 2021-05-21 | Disposition: A | Discharge status: Home / Self Care | Payer: Self-pay | Source: Ambulatory Visit | Attending: Internal Medicine | Admitting: Internal Medicine

## 2021-05-21 ENCOUNTER — Telehealth: Payer: Self-pay | Admitting: Oncology

## 2021-05-21 DIAGNOSIS — J449 Chronic obstructive pulmonary disease, unspecified: Secondary | ICD-10-CM

## 2021-05-21 NOTE — Telephone Encounter (Signed)
Scheduled appt per 5/1 referral. Pt is aware of appt date and time. Pt is aware to arrive 15 mins prior to appt time and to bring and updated insurance card. Pt is aware of appt location.   ?

## 2021-05-22 ENCOUNTER — Telehealth: Payer: Self-pay | Admitting: Internal Medicine

## 2021-05-22 DIAGNOSIS — J449 Chronic obstructive pulmonary disease, unspecified: Secondary | ICD-10-CM

## 2021-05-22 NOTE — Telephone Encounter (Signed)
Called and spoke with patient. He stated that his nebulizer machine no longer works. He wanted to purchase the machine out of pocket but I advised him it would be best to let his insurance pay for it since the machine can be expensive. He verbalized understanding. He is not currently established with a DME.  ? ?Dr. Shearon Stalls, please advise if you are ok with [place] placing an urgent nebulizer referral for him. Thanks!  ?

## 2021-05-22 NOTE — Telephone Encounter (Signed)
Called and spoke with patient. He is aware that the order has been placed.  ? ?Nothing further needed at time of call.  ?

## 2021-05-23 ENCOUNTER — Other Ambulatory Visit (HOSPITAL_COMMUNITY): Payer: Self-pay

## 2021-05-23 DIAGNOSIS — J45901 Unspecified asthma with (acute) exacerbation: Secondary | ICD-10-CM | POA: Diagnosis not present

## 2021-05-24 ENCOUNTER — Ambulatory Visit (INDEPENDENT_AMBULATORY_CARE_PROVIDER_SITE_OTHER): Payer: Medicare Other | Admitting: Internal Medicine

## 2021-05-24 ENCOUNTER — Encounter: Payer: Self-pay | Admitting: Internal Medicine

## 2021-05-24 VITALS — BP 116/72 | HR 54 | Ht 66.0 in | Wt 166.6 lb

## 2021-05-24 DIAGNOSIS — I2699 Other pulmonary embolism without acute cor pulmonale: Secondary | ICD-10-CM | POA: Diagnosis not present

## 2021-05-24 DIAGNOSIS — J449 Chronic obstructive pulmonary disease, unspecified: Secondary | ICD-10-CM

## 2021-05-24 DIAGNOSIS — R310 Gross hematuria: Secondary | ICD-10-CM | POA: Diagnosis not present

## 2021-05-24 LAB — CBC
HCT: 36.3 % — ABNORMAL LOW (ref 39.0–52.0)
Hemoglobin: 12 g/dL — ABNORMAL LOW (ref 13.0–17.0)
MCHC: 33 g/dL (ref 30.0–36.0)
MCV: 88.8 fl (ref 78.0–100.0)
Platelets: 249 10*3/uL (ref 150.0–400.0)
RBC: 4.09 Mil/uL — ABNORMAL LOW (ref 4.22–5.81)
RDW: 13.8 % (ref 11.5–15.5)
WBC: 11.7 10*3/uL — ABNORMAL HIGH (ref 4.0–10.5)

## 2021-05-24 NOTE — Progress Notes (Signed)
? ?      ?James Barker    [id number]    [birth year] ? ?Primary Care Physician:[name], [name], PA ?Date of Appointment: 05/24/2021 ?Established Patient Visit ? ?Chief complaint:   ?Chief Complaint  ?Patient presents with  ? Follow-up  ?  Pt recently in the hospital 4/25-4/30 due to bilateral PE. Pt states he has been doing okay since being out of the hospital, states he has been fatigued.  ? ? ? ?HPI: ?James Barker is a 71 y.o. man with history of seere allergic asthma COPD overlap syndrome. ?Dupixent started 04/26/2021.  ? ?Interval Updates: ?Here for follow up after hospitalization for Acute pulmonary embolism provoked in the setting of bladder cancer. He was referred to urology and oncology as an outpatient. Was discharged on Eliquis.  ? 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  ? ?Now having hematuria again since discharge. He was passing clots when he first got home but denies urinary retention ? ?Seeing oncology on the 17th to discuss next treatment options. ? ?Taking albuterol nebs three times a day. Also taking saline nebs. Feels like the treatment is onerous. ?Still taking dupixent its been less than a month  ? ?Current Regimen: spiriva, advair, mucinex,  prn albuterol, dupixent.  ?Asthma Triggers: allergies, viral infections.  ?Exacerbations in the last year: 5-6 requiring prendisone ?History of hospitalization or intubation: yes in [place] ?Allergy Testing: sensitization to aspergillus in 2023 on immunocap testing ?GERD: denies ?Allergic Rhinitis: occasional ?ACT:  ?Asthma Control Test ACT Total Score  ?05/24/2021 ? 9:57 AM 11  ?12/22/2020 ? 8:59 AM 17  ? ?FeNO: 53 ppb ? ? ?I have reviewed the patient's family social and past medical history and updated as appropriate.  ? ?Past Medical History:  ?Diagnosis Date  ? Arthritis   ? Asthma-COPD overlap syndrome ([place])   ? pulmonology--- dr n Ladavion Savitz;   last exacerbation 05-08-2021  ? Benign localized prostatic hyperplasia with  lower urinary tract symptoms (LUTS)   ? Chronic cough   ? due to copd  ? Dairy product intolerance   ? DOE (dyspnea on exertion)   ? d/t asthma/ COPD  ? Emphysema, unspecified ([place])   ? Full dentures   ? History of bladder cancer 2020  ? s/p TURBT w/ chemo instillation  ? History of COVID-19   ? per pt 2021 (no at same time of pneumonia)  w/ moderate symptoms , recovered at home, that resolved back to baseline  ? History of hepatitis C 2018  ? hx chronic hepatitis C,  per pt treated and told was cured  ? History of latent tuberculosis 2017  ? pt tested positive for TB exposure, not active, treated w/ INH for 9 months  ? History of MAI infection 2021  ? never treated  ? History of pulmonary embolism   ? (05-09-2021  pt stated never had clot prior to 2017 , none since 2020, had negative blood disorder work-up)//  2017  treated w/ xarelto 6 months//  and recurrent 04/ 2020 in setting severe UTI and deconditioning w/ prolonged admission due to intolerant to anticoagulants IVC filter inserted  ? Hypertension   ? OSA (obstructive sleep apnea)   ? per pt dx yrs ago, intolerant to cpap  ? 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 S/P IVC filter 2020  ? for PE  ? Weak urinary stream   ? Wears glasses   ? ? ?Past Surgical History:  ?Procedure Laterality Date  ?  ANAL FISTULECTOMY    ? x5  last one 2008  ? BRONCHOSCOPY  2021  ? per pt had pneumonia   (done in [place])  ? INGUINAL HERNIA REPAIR Right 2010  ? IVC FILTER INSERTION  2020  ? in [place]  ? ORIF WRIST FRACTURE Left 2016  ? SHOULDER ARTHROSCOPY W/ ROTATOR CUFF REPAIR Left 2016  ? TRANSURETHRAL RESECTION OF BLADDER TUMOR  2020  ? in [place]  ? TRANSURETHRAL RESECTION OF PROSTATE  2021  ? in [place]  ? TRANSURETHRAL RESECTION OF PROSTATE N/A 05/11/2021  ? Procedure: PROSTATE EXAM UNDER ANESTHESIA; TRANSURETHRAL RESECTION OF THE PROSTATE (TURP), BLADDER BIOPSY;  Surgeon: Festus Aloe, MD;  Location: [facility];  Service: Urology;  Laterality: N/A;  ? UMBILICAL HERNIA REPAIR   2011  ? ? ?Family History  ?Problem Relation Age of Onset  ? Pulmonary embolism Mother   ? Emphysema Father   ? Lung cancer Sister   ? Asthma Child   ? Asthma Child   ? ? ?Social History  ? ?Occupational History  ? Not on file  ?Tobacco Use  ? Smoking status: Former  ?  Packs/day: 1.00  ?  Years: 20.00  ?  Pack years: 20.00  ?  Types: Cigarettes  ?  Quit date: 21  ?  Years since quitting: 31.3  ? Smokeless tobacco: Never  ?Vaping Use  ? Vaping Use: Never used  ?Substance and Sexual Activity  ? Alcohol use: Not Currently  ? Drug use: Yes  ?  Types: Marijuana  ?  Comment: 05-09-2021  per pt smokes occasional  ? Sexual activity: Not on file  ? ? ? ?Physical Exam: ?Blood pressure 116/72, pulse (!) 54, height '5\' 6"'$  (1.676 m), weight 166 lb 9.6 oz (75.6 kg), SpO2 99 %. ? ?Gen: fatigued, no distress ?CV: bradycardic, regular ?Resp: ctab no wheezes or crackles ? ?Data Reviewed: ?Imaging: ?I have personally reviewed the CTPE study 05/15/21 which shows bilateral segmental and subsegmental PE with known bronchial thickening consistent with chronic bronchitis.  ? ?PFTs: ? ? ?  Latest Ref Rng & Units 02/20/2021  ?  2:58 PM  ?PFT Results  ?FVC-Pre L 3.20    ?FVC-Predicted Pre % 84    ?FVC-Post L 3.30    ?FVC-Predicted Post % 87    ?Pre FEV1/FVC % % 49    ?Post FEV1/FCV % % 51    ?FEV1-Pre L 1.57    ?FEV1-Predicted Pre % 56    ?FEV1-Post L 1.70    ?DLCO uncorrected ml/min/mmHg 14.74    ?DLCO UNC% % 64    ?DLCO corrected ml/min/mmHg 14.74    ?DLCO COR %Predicted % 64    ?DLVA Predicted % 67    ?TLC L 6.54    ?TLC % Predicted % 105    ?RV % Predicted % 131    ? ?I have personally reviewed the patient's PFTs and moderately severe airflow limitation. Reduced diffusion capacity ? ?Labs: ? ?Immunization status: ?Immunization History  ?Administered Date(s) Administered  ? Influenza, High Dose Seasonal PF 12/08/2020  ? Moderna Sars-Covid-2 Vaccination 03/25/2019, 04/22/2019, 09/30/2019  ? Pneumococcal-Unspecified 12/08/2020  ? ? ?External  Records Personally Reviewed: hospital stay ? ?Assessment:  ?Allergic Asthma COPD Overlap Syndrome ?Radiographic emphysema ?Acinetobacter colonization ?Acute pulmonary embolism on eliquis, provoked in the setting of bladder cancer ?Hematuria  ? ?Plan/Recommendations: ?Continue nebulizer therapy with saline and albuterol with flutter valve.Continue spiriva, advair and dupixent.  ?Needs to continue eliquis through treatment of his active malignancy  at the least. His previous VTE was also provoked in the setting of malignancy.  ?Will check a CBC today given his hematuria. Will need to talk to oncology and urology if there is a significant drop requiring cessation of eliquis therapy.  ? ?Return to Care: ?Return in about 3 months (around 08/24/2021).  ? ?Lenice Llamas, MD ?Pulmonary and Critical Care Medicine ?[place] ?Office:[phone] ? ? ? ? ? ?

## 2021-05-24 NOTE — Patient Instructions (Signed)
Please schedule follow up scheduled with myself in 3 months.  If my schedule is not open yet, we will contact you with a reminder closer to that time. Please call [phone] if you haven't heard from [place] a month before.  ? ?Get your blood count checked today. I will call you with the results ?If you are passing clots which make it unable for you to pass urine, you need to seek emergency care.  ? ?Follow up with Dr. Hans Eden in oncology. ? ?Continue dupixent and all your other inhaler therapies. We need to give it some more time to work.  ? ?

## 2021-05-30 DIAGNOSIS — I2699 Other pulmonary embolism without acute cor pulmonale: Secondary | ICD-10-CM | POA: Diagnosis not present

## 2021-05-30 DIAGNOSIS — R319 Hematuria, unspecified: Secondary | ICD-10-CM | POA: Diagnosis not present

## 2021-05-30 DIAGNOSIS — N1831 Chronic kidney disease, stage 3a: Secondary | ICD-10-CM | POA: Diagnosis not present

## 2021-05-30 DIAGNOSIS — Z86711 Personal history of pulmonary embolism: Secondary | ICD-10-CM | POA: Diagnosis not present

## 2021-05-30 DIAGNOSIS — E7800 Pure hypercholesterolemia, unspecified: Secondary | ICD-10-CM | POA: Diagnosis not present

## 2021-05-30 DIAGNOSIS — D692 Other nonthrombocytopenic purpura: Secondary | ICD-10-CM | POA: Diagnosis not present

## 2021-05-31 ENCOUNTER — Other Ambulatory Visit (HOSPITAL_COMMUNITY): Payer: Self-pay

## 2021-06-06 ENCOUNTER — Inpatient Hospital Stay: Payer: Medicare Other | Attending: Oncology | Admitting: Oncology

## 2021-06-06 ENCOUNTER — Other Ambulatory Visit: Payer: Self-pay

## 2021-06-06 VITALS — BP 121/72 | HR 66 | Temp 97.8°F | Resp 18 | Ht 66.0 in | Wt 168.8 lb

## 2021-06-06 DIAGNOSIS — Z8616 Personal history of COVID-19: Secondary | ICD-10-CM | POA: Insufficient documentation

## 2021-06-06 DIAGNOSIS — Z86711 Personal history of pulmonary embolism: Secondary | ICD-10-CM | POA: Insufficient documentation

## 2021-06-06 DIAGNOSIS — Z87891 Personal history of nicotine dependence: Secondary | ICD-10-CM | POA: Diagnosis not present

## 2021-06-06 DIAGNOSIS — N400 Enlarged prostate without lower urinary tract symptoms: Secondary | ICD-10-CM | POA: Insufficient documentation

## 2021-06-06 DIAGNOSIS — Z923 Personal history of irradiation: Secondary | ICD-10-CM | POA: Insufficient documentation

## 2021-06-06 DIAGNOSIS — G4730 Sleep apnea, unspecified: Secondary | ICD-10-CM | POA: Insufficient documentation

## 2021-06-06 DIAGNOSIS — J432 Centrilobular emphysema: Secondary | ICD-10-CM | POA: Diagnosis not present

## 2021-06-06 DIAGNOSIS — Z9221 Personal history of antineoplastic chemotherapy: Secondary | ICD-10-CM | POA: Insufficient documentation

## 2021-06-06 DIAGNOSIS — Z8551 Personal history of malignant neoplasm of bladder: Secondary | ICD-10-CM

## 2021-06-06 DIAGNOSIS — C679 Malignant neoplasm of bladder, unspecified: Secondary | ICD-10-CM | POA: Insufficient documentation

## 2021-06-06 DIAGNOSIS — Z79899 Other long term (current) drug therapy: Secondary | ICD-10-CM | POA: Diagnosis not present

## 2021-06-06 DIAGNOSIS — J449 Chronic obstructive pulmonary disease, unspecified: Secondary | ICD-10-CM | POA: Insufficient documentation

## 2021-06-06 DIAGNOSIS — Z5112 Encounter for antineoplastic immunotherapy: Secondary | ICD-10-CM | POA: Diagnosis not present

## 2021-06-06 DIAGNOSIS — I700 Atherosclerosis of aorta: Secondary | ICD-10-CM | POA: Insufficient documentation

## 2021-06-06 DIAGNOSIS — M129 Arthropathy, unspecified: Secondary | ICD-10-CM | POA: Diagnosis not present

## 2021-06-06 NOTE — Progress Notes (Signed)
?Reason for the request:    Bladder cancer ? ?HPI: I was asked by Dr. Junious Silk to evaluate Mr. James Barker for the evaluation of bladder cancer.  He is a 71 year old with history of COPD, BPH and history of TURP procedure.  He was also diagnosed with bladder cancer dating back to 2020.  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  He received 6 treatments with Mitomycin-C instead.  He establish care locally after traveling from [place] and [place] and was seen by Dr. Junious Silk.  He underwent cystoscopy and transurethral resection of the prostate completed on May 11, 2021.  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  He was hospitalized again on May 15, 2021 and was found to have pulmonary embolism after presenting with chest pain and nausea and vomiting.  His CT scan showed segmental and subsegmental embolus present in the right upper lobe right lower lobe and left lower lobe.  He was started on Eliquis and has been on it since that time.  He did develop hematuria periodically.  He does have history of pulmonary embolism noted previously treated with an IVC filter in place due to hematuria.  Clinically, he reports doing well without any major complaints.  He denies any urinary frequency, urgency or hesitancy.  He does report very little hematuria at this time. ? ?He does not report any headaches, blurry vision, syncope or seizures. Does not report any fevers, chills or sweats.  Does not report any cough, wheezing or hemoptysis.  Does not report any chest pain, palpitation, orthopnea or leg edema.  Does not report any nausea, vomiting or abdominal pain.  Does not report any constipation or diarrhea.  Does not report any skeletal complaints.    Does not report frequency, urgency or hematuria.  Does not report any skin rashes or lesions. Does not report any heat or cold intolerance.   Does not report any lymphadenopathy or petechiae.  Does not report any anxiety or depression.  Remaining review of systems is negative.  ? ? ? ?Past Medical History:  ?Diagnosis Date  ? Arthritis   ? Asthma-COPD overlap syndrome ([place])   ? pulmonology--- dr n desai;   last exacerbation 05-08-2021  ? Benign localized prostatic hyperplasia with lower urinary tract symptoms (LUTS)   ? Chronic cough   ? due to copd  ? Dairy product intolerance   ? DOE (dyspnea on exertion)   ? d/t asthma/ COPD  ? Emphysema, unspecified ([place])   ? Full dentures   ? History of bladder cancer 2020  ? s/p TURBT w/ chemo instillation  ? History of COVID-19   ? per pt 2021 (no at same time of pneumonia)  w/ moderate symptoms , recovered at home, that resolved back to baseline  ? History of hepatitis C 2018  ? hx chronic hepatitis C,  per pt treated and told was cured  ? History of latent tuberculosis 2017  ? pt tested positive for TB exposure, not active, treated w/ INH for 9 months  ? History of MAI infection 2021  ? never treated  ? History of pulmonary embolism   ? (05-09-2021  pt stated never had clot prior to 2017 , none since 2020, had negative blood disorder work-up)//  2017  treated w/ xarelto 6 months//  and recurrent 04/ 2020 in setting severe UTI and deconditioning w/ prolonged admission  due to intolerant to anticoagulants IVC filter inserted  ? Hypertension   ? OSA (obstructive sleep apnea)   ? per pt dx yrs ago, intolerant to cpap  ? 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 S/P IVC filter 2020  ? for PE  ? Weak urinary stream   ? Wears glasses   ?: ? ? ?Past Surgical History:  ?Procedure Laterality Date  ? ANAL FISTULECTOMY    ? x5  last one 2008  ? BRONCHOSCOPY  2021  ? per pt had pneumonia   (done in [place])  ? INGUINAL HERNIA REPAIR Right 2010  ? IVC FILTER INSERTION  2020  ? in [place]  ? ORIF WRIST FRACTURE Left 2016  ? SHOULDER ARTHROSCOPY W/ ROTATOR CUFF REPAIR Left 2016  ? TRANSURETHRAL RESECTION OF BLADDER TUMOR  2020  ? in [place]  ?  TRANSURETHRAL RESECTION OF PROSTATE  2021  ? in [place]  ? TRANSURETHRAL RESECTION OF PROSTATE N/A 05/11/2021  ? Procedure: PROSTATE EXAM UNDER ANESTHESIA; TRANSURETHRAL RESECTION OF THE PROSTATE (TURP), BLADDER BIOPSY;  Surgeon: Festus Aloe, MD;  Location: [facility];  Service: Urology;  Laterality: N/A;  ? UMBILICAL HERNIA REPAIR  2011  ?: ? ? ?Current Outpatient Medications:  ?  albuterol (PROVENTIL) (2.5 MG/3ML) 0.083% nebulizer solution, Take 3 mLs (2.5 mg total) by nebulization every 6 (six) hours as needed for wheezing or shortness of breath., Disp: 225 mL, Rfl: 3 ?  albuterol (VENTOLIN HFA) 108 (90 Base) MCG/ACT inhaler, TAKE 2 PUFFS BY MOUTH EVERY 6 HOURS AS NEEDED FOR WHEEZE OR SHORTNESS OF BREATH, Disp: 6.7 each, Rfl: 5 ?  amLODipine (NORVASC) 10 MG tablet, Take 1 tablet (10 mg total) by mouth daily., Disp: 30 tablet, Rfl: 1 ?  apixaban (ELIQUIS) 5 MG TABS tablet, Take 1 tablet (5 mg total) by mouth 2 (two) times daily., Disp: 60 tablet, Rfl: 1 ?  atorvastatin (LIPITOR) 20 MG tablet, Take 20 mg by mouth daily., Disp: , Rfl:  ?  [START ON 06/07/2021] Dupilumab (DUPIXENT) 300 MG/2ML SOPN, Inject 300 mg into the skin every 14 (fourteen) days., Disp: 12 mL, Rfl: 1 ?  fluticasone-salmeterol (ADVAIR HFA) 230-21 MCG/ACT inhaler, Inhale 2 puffs into the lungs 2 (two) times daily. (Patient taking differently: Inhale 2 puffs into the lungs 2 (two) times daily.), Disp: 1 each, Rfl: 12 ?  guaiFENesin (MUCINEX) 600 MG 12 hr tablet, Take 1 tablet (600 mg total) by mouth 2 (two) times daily. (Patient taking differently: Take 600 mg by mouth 2 (two) times daily.), Disp: 60 tablet, Rfl: 1 ?  Misc. Devices MISC, Please dispense 1 flutter valve for use after inhalers, Disp: 1 each, Rfl: 0 ?  sodium chloride 0.9 % nebulizer solution, Take 3 mLs by nebulization 3 (three) times daily as needed for wheezing., Disp: 90 mL, Rfl: 12 ?  Tiotropium Bromide Monohydrate (SPIRIVA RESPIMAT) 2.5 MCG/ACT AERS, Inhale 2  puffs into the lungs daily., Disp: 4 g, Rfl: 5: ? ? ?Allergies  ?Allergen Reactions  ? Cheese Nausea And Vomiting  ? Lactose Intolerance (Gi) Diarrhea and Nausea And Vomiting  ?: ? ? ?Family History  ?Problem Relation Age of Onset  ? Pulmonary embolism Mother   ? Emphysema Father   ? Lung cancer Sister   ? Asthma Child   ? Asthma Child   ?: ? ? ?Social History  ? ?Socioeconomic History  ? Marital status: Divorced  ?  Spouse name: Not on file  ? Number of children: Not on file  ?  Years of education: Not on file  ? Highest education level: Not on file  ?Occupational History  ? Not on file  ?Tobacco Use  ? Smoking status: Former  ?  Packs/day: 1.00  ?  Years: 20.00  ?  Pack years: 20.00  ?  Types: Cigarettes  ?  Quit date: 34  ?  Years since quitting: 31.3  ? Smokeless tobacco: Never  ?Vaping Use  ? Vaping Use: Never used  ?Substance and Sexual Activity  ? Alcohol use: Not Currently  ? Drug use: Yes  ?  Types: Marijuana  ?  Comment: 05-09-2021  per pt smokes occasional  ? Sexual activity: Not on file  ?Other Topics Concern  ? Not on file  ?Social History Narrative  ? Not on file  ? ?Social Determinants of Health  ? ?Financial Resource Strain: Not on file  ?Food Insecurity: Not on file  ?Transportation Needs: Not on file  ?Physical Activity: Not on file  ?Stress: Not on file  ?Social Connections: Not on file  ?Intimate Partner Violence: Not on file  ?: ? ?Pertinent items are noted in HPI. ? ?Exam: ?Blood pressure 121/72, pulse 66, temperature 97.8 ?F (36.6 ?C), temperature source Temporal, resp. rate 18, height '5\' 6"'$  (1.676 m), weight 168 lb 12.8 oz (76.6 kg), SpO2 100 %. ?ECOG 1 ?General appearance: alert and cooperative appeared without distress. ?Head: atraumatic without any abnormalities. ?Eyes: conjunctivae/corneas clear. PERRL.  Sclera anicteric. ?Throat: lips, mucosa, and tongue normal; without oral thrush or ulcers. ?Resp: clear to auscultation bilaterally without rhonchi, wheezes or dullness to  percussion. ?Cardio: regular rate and rhythm, S1, S2 normal, no murmur, click, rub or gallop ?GI: soft, non-tender; bowel sounds normal; no masses,  no organomegaly ?Skin: Skin color, texture, turgor normal. No rashes or

## 2021-06-06 NOTE — Progress Notes (Signed)
START ON PATHWAY REGIMEN - Bladder ? ? ?  A cycle is every 21 days: ?    Pembrolizumab  ? ?**Always confirm dose/schedule in your pharmacy ordering system** ? ?Patient Characteristics: ?Pre-Cystectomy or Nonsurgical Candidate (Clinical Staging), High-Grade cTa, cN0 or cTis/cT1, cN0, Refractory to Intravesical BCG and Referring Urologist Indicates Other Intravesical Therapies Not Preferred ?Therapeutic Status: Pre-Cystectomy or Nonsurgical Candidate (Clinical Staging) ?AJCC M Category: cM0 ?AJCC 8 Stage Grouping: 0is ?AJCC T Category: cTis ?AJCC N Category: cN0 ?Intent of Therapy: ?Curative Intent, Discussed with Patient ?

## 2021-06-07 ENCOUNTER — Telehealth: Payer: Self-pay | Admitting: Oncology

## 2021-06-07 NOTE — Telephone Encounter (Signed)
Scheduled per 05/17 los, patient has been called and voicemail was left.

## 2021-06-11 ENCOUNTER — Other Ambulatory Visit: Payer: Self-pay | Admitting: Oncology

## 2021-06-11 MED ORDER — PROCHLORPERAZINE MALEATE 10 MG PO TABS: 10.0000 mg | ORAL_TABLET | Freq: Four times a day (QID) | ORAL | 0 refills | Status: DC | PRN

## 2021-06-13 ENCOUNTER — Other Ambulatory Visit (HOSPITAL_COMMUNITY): Payer: Self-pay

## 2021-06-13 ENCOUNTER — Other Ambulatory Visit: Payer: Self-pay

## 2021-06-13 ENCOUNTER — Inpatient Hospital Stay: Payer: Medicare Other

## 2021-06-13 ENCOUNTER — Telehealth: Payer: Self-pay

## 2021-06-13 NOTE — Telephone Encounter (Signed)
-----   Message from Wyatt Portela, MD sent at 06/13/2021  4:04 PM EDT ----- Regarding: RE: Anxiety Medication After reviewing his chart, I agree with the Prilosec OTC as discussed.  I do not feel comfortable prescribing anything for his anxiety and he needs to talk to his primary care provider regarding this issue. ----- Message ----- From: Baruch Merl, RN Sent: 06/13/2021   3:54 PM EDT To: Wyatt Portela, MD Subject: Anxiety Medication                             Mr. Skow here for Immunotherapy Education session. He has been experiencing vomiting at night with out nausea x 3 in the last ~week.  He is experiencing some acid reflux. One evening he did have some fried fish. Recommended that he stay away from greasy/fatty foods. His right nostril began bleeding with vomiting each time, but bleeding did stop on its own. Pt on Eliquis.   Since he was seen for new patient appointment 06-06-21 and that a cystectomy would be definitive treatment , he has not been able to sleep well. His anxiety level is high. He would like to know if an anti-anxiety medication could be prescribed for him. Told  Mr. Howton that Dr. Alen Blew had to review his chart and would have his nurse call him with recommendation in the next dayor so. Told him that Prilosec OTC daily would be fine to take to see if it helps with acid reflux. Suggested that he pick up compazine to have on had for N/V.  Thank you.  Barbaraann Share

## 2021-06-13 NOTE — Telephone Encounter (Signed)
Told Mr. James Barker that Dr. Alen Blew would like for him to discuss medication for anxiety with his PCP.  He is in agreement with the Prilosec OTC. Pt. Verbalized understanding.

## 2021-06-14 ENCOUNTER — Telehealth: Payer: Self-pay | Admitting: *Deleted

## 2021-06-14 NOTE — Telephone Encounter (Signed)
-----   Message from Wyatt Portela, MD sent at 06/13/2021  4:04 PM EDT ----- Regarding: RE: Anxiety Medication After reviewing his chart, I agree with the Prilosec OTC as discussed.  I do not feel comfortable prescribing anything for his anxiety and he needs to talk to his primary care provider regarding this issue. ----- Message ----- From: Baruch Merl, RN Sent: 06/13/2021   3:54 PM EDT To: Wyatt Portela, MD Subject: Anxiety Medication                             Mr. Tesar here for Immunotherapy Education session. He has been experiencing vomiting at night with out nausea x 3 in the last ~week.  He is experiencing some acid reflux. One evening he did have some fried fish. Recommended that he stay away from greasy/fatty foods. His right nostril began bleeding with vomiting each time, but bleeding did stop on its own. Pt on Eliquis.   Since he was seen for new patient appointment 06-06-21 and that a cystectomy would be definitive treatment , he has not been able to sleep well. His anxiety level is high. He would like to know if an anti-anxiety medication could be prescribed for him. Told  Mr. Flury that Dr. Alen Blew had to review his chart and would have his nurse call him with recommendation in the next dayor so. Told him that Prilosec OTC daily would be fine to take to see if it helps with acid reflux. Suggested that he pick up compazine to have on had for N/V.  Thank you.  Barbaraann Share

## 2021-06-14 NOTE — Telephone Encounter (Signed)
PC to patient, informed him of Dr. Hazeline Junker recommendation below.  He verbalizes understanding.

## 2021-06-15 ENCOUNTER — Other Ambulatory Visit (HOSPITAL_COMMUNITY): Payer: Self-pay

## 2021-06-15 ENCOUNTER — Telehealth: Payer: Self-pay | Admitting: Internal Medicine

## 2021-06-15 DIAGNOSIS — B370 Candidal stomatitis: Secondary | ICD-10-CM

## 2021-06-15 MED ORDER — CLOTRIMAZOLE 10 MG MT TROC: 10.0000 mg | Freq: Every day | OROMUCOSAL | 0 refills | Status: DC

## 2021-06-15 NOTE — Telephone Encounter (Signed)
Can try clotrimazole troches instead this time. Sent to pharmacy.   I recommend he gargle with an alcohol based mouthwash to help get rid of the thrush and prevent it from happening again.

## 2021-06-15 NOTE — Telephone Encounter (Signed)
Called and spoke with patient. He stated that he was prescribed Nystatin last month for thrush and has finished that prescription. Original prescription was ordered by a hospitalist. He believes he has developed thrush again. He confirmed that he is rinsing his mouth out each time that he uses his inhaler. Denied any throat pain or swelling.   He wants to know if he could another prescription for the Nystatin. Pharmacy is [facility] on [address].   Dr. Shearon Stalls, can you please advise? Thanks!

## 2021-06-15 NOTE — Telephone Encounter (Signed)
Called and spoke with patient to let him know the recs from Dr. Shearon Stalls and that she has sent in a new RX. He expressed understanding. Nothing further needed at this time.

## 2021-06-19 ENCOUNTER — Encounter: Payer: Self-pay | Admitting: Oncology

## 2021-06-19 NOTE — Progress Notes (Signed)
Called pt to introduce myself as his Arboriculturist.  Pt has 2 insurances so copay assistance isn't needed.  I informed him of the [employer] and went over what it covers.  He would like to apply so he will bring his proof of income on 06/20/21.  If approved I will give him an expense sheet and my card for any questions or concerns he may have in the future.

## 2021-06-20 ENCOUNTER — Other Ambulatory Visit: Payer: Self-pay

## 2021-06-20 ENCOUNTER — Inpatient Hospital Stay: Payer: Medicare Other

## 2021-06-20 VITALS — BP 130/82 | HR 56 | Temp 98.1°F | Resp 18 | Wt 169.5 lb

## 2021-06-20 DIAGNOSIS — Z923 Personal history of irradiation: Secondary | ICD-10-CM | POA: Diagnosis not present

## 2021-06-20 DIAGNOSIS — Z9221 Personal history of antineoplastic chemotherapy: Secondary | ICD-10-CM | POA: Diagnosis not present

## 2021-06-20 DIAGNOSIS — C679 Malignant neoplasm of bladder, unspecified: Secondary | ICD-10-CM

## 2021-06-20 DIAGNOSIS — Z8616 Personal history of COVID-19: Secondary | ICD-10-CM | POA: Diagnosis not present

## 2021-06-20 DIAGNOSIS — Z5112 Encounter for antineoplastic immunotherapy: Secondary | ICD-10-CM | POA: Diagnosis not present

## 2021-06-20 DIAGNOSIS — J449 Chronic obstructive pulmonary disease, unspecified: Secondary | ICD-10-CM | POA: Diagnosis not present

## 2021-06-20 DIAGNOSIS — Z8551 Personal history of malignant neoplasm of bladder: Secondary | ICD-10-CM

## 2021-06-20 DIAGNOSIS — Z79899 Other long term (current) drug therapy: Secondary | ICD-10-CM | POA: Diagnosis not present

## 2021-06-20 DIAGNOSIS — I700 Atherosclerosis of aorta: Secondary | ICD-10-CM | POA: Diagnosis not present

## 2021-06-20 DIAGNOSIS — M129 Arthropathy, unspecified: Secondary | ICD-10-CM | POA: Diagnosis not present

## 2021-06-20 DIAGNOSIS — J432 Centrilobular emphysema: Secondary | ICD-10-CM | POA: Diagnosis not present

## 2021-06-20 DIAGNOSIS — Z86711 Personal history of pulmonary embolism: Secondary | ICD-10-CM | POA: Diagnosis not present

## 2021-06-20 DIAGNOSIS — G4730 Sleep apnea, unspecified: Secondary | ICD-10-CM | POA: Diagnosis not present

## 2021-06-20 DIAGNOSIS — Z87891 Personal history of nicotine dependence: Secondary | ICD-10-CM | POA: Diagnosis not present

## 2021-06-20 LAB — CMP (CANCER CENTER ONLY)
ALT: 14 U/L (ref 0–44)
AST: 21 U/L (ref 15–41)
Albumin: 4.1 g/dL (ref 3.5–5.0)
Alkaline Phosphatase: 47 U/L (ref 38–126)
Anion gap: 6 (ref 5–15)
BUN: 28 mg/dL — ABNORMAL HIGH (ref 8–23)
CO2: 22 mmol/L (ref 22–32)
Calcium: 9.2 mg/dL (ref 8.9–10.3)
Chloride: 108 mmol/L (ref 98–111)
Creatinine: 1.56 mg/dL — ABNORMAL HIGH (ref 0.61–1.24)
GFR, Estimated: 47 mL/min — ABNORMAL LOW (ref >60)
Glucose, Bld: 114 mg/dL — ABNORMAL HIGH (ref 70–99)
Potassium: 3.9 mmol/L (ref 3.5–5.1)
Sodium: 136 mmol/L (ref 135–145)
Total Bilirubin: 0.6 mg/dL (ref 0.3–1.2)
Total Protein: 7.3 g/dL (ref 6.5–8.1)

## 2021-06-20 LAB — CBC WITH DIFFERENTIAL (CANCER CENTER ONLY)
Abs Immature Granulocytes: 0.01 10*3/uL (ref 0.00–0.07)
Basophils Absolute: 0 10*3/uL (ref 0.0–0.1)
Basophils Relative: 1 %
Eosinophils Absolute: 0.1 10*3/uL (ref 0.0–0.5)
Eosinophils Relative: 1 %
HCT: 34.9 % — ABNORMAL LOW (ref 39.0–52.0)
Hemoglobin: 12.1 g/dL — ABNORMAL LOW (ref 13.0–17.0)
Immature Granulocytes: 0 %
Lymphocytes Relative: 24 %
Lymphs Abs: 1.6 10*3/uL (ref 0.7–4.0)
MCH: 30.4 pg (ref 26.0–34.0)
MCHC: 34.7 g/dL (ref 30.0–36.0)
MCV: 87.7 fL (ref 80.0–100.0)
Monocytes Absolute: 0.6 10*3/uL (ref 0.1–1.0)
Monocytes Relative: 9 %
Neutro Abs: 4.4 10*3/uL (ref 1.7–7.7)
Neutrophils Relative %: 65 %
Platelet Count: 186 10*3/uL (ref 150–400)
RBC: 3.98 MIL/uL — ABNORMAL LOW (ref 4.22–5.81)
RDW: 13.3 % (ref 11.5–15.5)
WBC Count: 6.7 10*3/uL (ref 4.0–10.5)
nRBC: 0 % (ref 0.0–0.2)

## 2021-06-20 LAB — TSH: TSH: 1.974 u[IU]/mL (ref 0.350–4.500)

## 2021-06-20 MED ORDER — SODIUM CHLORIDE 0.9 % IV SOLN
200.0000 mg | Freq: Once | INTRAVENOUS | Status: AC
  Administered 2021-06-20: 200 mg via INTRAVENOUS
  Filled 2021-06-20: qty 8

## 2021-06-20 MED ORDER — SODIUM CHLORIDE 0.9 % IV SOLN: Freq: Once | INTRAVENOUS | Status: AC

## 2021-06-20 NOTE — Patient Instructions (Signed)
[facility]  Discharge Instructions: Thank you for choosing [facility] to provide your oncology and hematology care.   If you have a lab appointment with the Cancer Center, please go directly to the Cancer Center and check in at the registration area.   Wear comfortable clothing and clothing appropriate for easy access to any Portacath or PICC line.   We strive to give you quality time with your provider. You may need to reschedule your appointment if you arrive late (15 or more minutes).  Arriving late affects you and other patients whose appointments are after yours.  Also, if you miss three or more appointments without notifying the office, you may be dismissed from the clinic at the provider's discretion.      For prescription refill requests, have your pharmacy contact our office and allow 72 hours for refills to be completed.    Today you received the following chemotherapy and/or immunotherapy agents: Keytruda      To help prevent nausea and vomiting after your treatment, we encourage you to take your nausea medication as directed.  BELOW ARE SYMPTOMS THAT SHOULD BE REPORTED IMMEDIATELY: *FEVER GREATER THAN 100.4 F (38 C) OR HIGHER *CHILLS OR SWEATING *NAUSEA AND VOMITING THAT IS NOT CONTROLLED WITH YOUR NAUSEA MEDICATION *UNUSUAL SHORTNESS OF BREATH *UNUSUAL BRUISING OR BLEEDING *URINARY PROBLEMS (pain or burning when urinating, or frequent urination) *BOWEL PROBLEMS (unusual diarrhea, constipation, pain near the anus) TENDERNESS IN MOUTH AND THROAT WITH OR WITHOUT PRESENCE OF ULCERS (sore throat, sores in mouth, or a toothache) UNUSUAL RASH, SWELLING OR PAIN  UNUSUAL VAGINAL DISCHARGE OR ITCHING   Items with * indicate a potential emergency and should be followed up as soon as possible or go to the Emergency Department if any problems should occur.  Please show the CHEMOTHERAPY ALERT CARD or IMMUNOTHERAPY ALERT CARD at check-in to  the Emergency Department and triage nurse.  Should you have questions after your visit or need to cancel or reschedule your appointment, please contact [facility]  Dept: [phone]  and follow the prompts.  Office hours are 8:00 a.m. to 4:30 p.m. Monday - Friday. Please note that voicemails left after 4:00 p.m. may not be returned until the following business day.  We are closed weekends and major holidays. You have access to a nurse at all times for urgent questions. Please call the main number to the clinic Dept: [phone] and follow the prompts.   For any non-urgent questions, you may also contact your provider using MyChart. We now offer e-Visits for anyone 18 and older to request care online for non-urgent symptoms. For details visit [email or web address].   Also download the MyChart app! Go to the app store, search "MyChart", open the app, select [facility], and log in with your MyChart username and password.  Due to Covid, a mask is required upon entering the hospital/clinic. If you do not have a mask, one will be given to you upon arrival. For doctor visits, patients may have 1 support person aged 18 or older with them. For treatment visits, patients cannot have anyone with them due to current Covid guidelines and our immunocompromised population.  Pembrolizumab injection What is this medication? PEMBROLIZUMAB (pem broe liz ue mab) is a monoclonal antibody. It is used to treat certain types of cancer. This medicine may be used for other purposes; ask your health care provider or pharmacist if you have questions. COMMON BRAND NAME(S): Keytruda What should   What should I tell my care team before I take this medication? They need to know if you have any of these conditions: autoimmune diseases like Crohn's disease, ulcerative colitis, or lupus have had or planning to have an allogeneic stem cell transplant (uses someone else's stem cells) history of organ  transplant history of chest radiation nervous system problems like myasthenia gravis or Guillain-Barre syndrome an unusual or allergic reaction to pembrolizumab, other medicines, foods, dyes, or preservatives pregnant or trying to get pregnant breast-feeding How should I use this medication? This medicine is for infusion into a vein. It is given by a health care professional in a hospital or clinic setting. A special MedGuide will be given to you before each treatment. Be sure to read this information carefully each time. Talk to your pediatrician regarding the use of this medicine in children. While this drug may be prescribed for children as young as 6 months for selected conditions, precautions do apply. Overdosage: If you think you have taken too much of this medicine contact a poison control center or emergency room at once. NOTE: This medicine is only for you. Do not share this medicine with others. What if I miss a dose? It is important not to miss your dose. Call your doctor or health care professional if you are unable to keep an appointment. What may interact with this medication? Interactions have not been studied. This list may not describe all possible interactions. Give your health care provider a list of all the medicines, herbs, non-prescription drugs, or dietary supplements you use. Also tell them if you smoke, drink alcohol, or use illegal drugs. Some items may interact with your medicine. What should I watch for while using this medication? Your condition will be monitored carefully while you are receiving this medicine. You may need blood work done while you are taking this medicine. Do not become pregnant while taking this medicine or for 4 months after stopping it. Women should inform their doctor if they wish to become pregnant or think they might be pregnant. There is a potential for serious side effects to an unborn child. Talk to your health care professional or  pharmacist for more information. Do not breast-feed an infant while taking this medicine or for 4 months after the last dose. What side effects may I notice from receiving this medication? Side effects that you should report to your doctor or health care professional as soon as possible: allergic reactions like skin rash, itching or hives, swelling of the face, lips, or tongue bloody or black, tarry breathing problems changes in vision chest pain chills confusion constipation cough diarrhea dizziness or feeling faint or lightheaded fast or irregular heartbeat fever flushing joint pain low blood counts - this medicine may decrease the number of white blood cells, red blood cells and platelets. You may be at increased risk for infections and bleeding. muscle pain muscle weakness pain, tingling, numbness in the hands or feet persistent headache redness, blistering, peeling or loosening of the skin, including inside the mouth signs and symptoms of high blood sugar such as dizziness; dry mouth; dry skin; fruity breath; nausea; stomach pain; increased hunger or thirst; increased urination signs and symptoms of kidney injury like trouble passing urine or change in the amount of urine signs and symptoms of liver injury like dark urine, light-colored stools, loss of appetite, nausea, right upper belly pain, yellowing of the eyes or skin sweating swollen lymph nodes weight loss Side effects that usually do not require  report to your doctor or health care professional if they continue or are bothersome): decreased appetite hair loss tiredness This list may not describe all possible side effects. Call your doctor for medical advice about side effects. You may report side effects to FDA at 1-800-FDA-1088. Where should I keep my medication? This drug is given in a hospital or clinic and will not be stored at home. NOTE: This sheet is a summary. It may not cover all possible  information. If you have questions about this medicine, talk to your doctor, pharmacist, or health care provider.  2023 Elsevier/Gold Standard (2020-12-08 00:00:00)  

## 2021-06-20 NOTE — Progress Notes (Signed)
Per Alen Blew MD, ok to treat today with SCR 1.56

## 2021-06-21 ENCOUNTER — Telehealth: Payer: Self-pay

## 2021-06-21 NOTE — Telephone Encounter (Signed)
James Barker stated that he had two bouts of diarrhea last evening and called the the after hours nurse.  She told him to use imodium and call Dr. Hazeline Junker office in the morning. He left a message for Dr. Hazeline Junker nurse that the diarrhea had stopped. He has experienced some muscle aches along with a headache and fatigue today. Tylenol has been effective in decreasing the aches and H/A.   He is drinking and urinating well. He has not eaten much today. He does not have much of an appetite. Encourage him to take in some soup and peanut butter and crackers to get in some protein.  Pt verbalized understanding. James Barker will call the office at [phone] if he has any questions or concerns.

## 2021-06-21 NOTE — Telephone Encounter (Signed)
-----   Message from Rafael Bihari, RN sent at 06/20/2021  3:27 PM EDT ----- Regarding: Dr. Alen Blew pt, first time Keytruda Dr. Alen Blew pt, came in 5/31 for first time Keytruda. Tolerated infusion well. Needs call back.

## 2021-06-28 ENCOUNTER — Telehealth: Payer: Self-pay | Admitting: *Deleted

## 2021-06-28 NOTE — Telephone Encounter (Signed)
James Barker states he has developed a few blisters, raised areas and itching on legs and his side over the last week. Had Keytruda on 5/31. Discussed taking benadryl or using hydrocortisone cream for itching. Next appt with Dr Alen Blew and [name] is on 6/21. Will call if it gets worse.

## 2021-07-05 DIAGNOSIS — C674 Malignant neoplasm of posterior wall of bladder: Secondary | ICD-10-CM | POA: Diagnosis not present

## 2021-07-09 ENCOUNTER — Other Ambulatory Visit (HOSPITAL_COMMUNITY): Payer: Self-pay

## 2021-07-10 ENCOUNTER — Other Ambulatory Visit (HOSPITAL_COMMUNITY): Payer: Self-pay

## 2021-07-11 ENCOUNTER — Inpatient Hospital Stay: Payer: Medicare Other

## 2021-07-11 ENCOUNTER — Inpatient Hospital Stay: Payer: Medicare Other | Attending: Oncology

## 2021-07-11 ENCOUNTER — Other Ambulatory Visit (HOSPITAL_COMMUNITY): Payer: Self-pay

## 2021-07-11 ENCOUNTER — Inpatient Hospital Stay (HOSPITAL_BASED_OUTPATIENT_CLINIC_OR_DEPARTMENT_OTHER): Payer: Medicare Other | Admitting: Oncology

## 2021-07-11 ENCOUNTER — Encounter: Payer: Self-pay | Admitting: Oncology

## 2021-07-11 VITALS — BP 136/75 | HR 60 | Temp 97.7°F | Resp 17 | Ht 66.0 in | Wt 170.5 lb

## 2021-07-11 DIAGNOSIS — Z5112 Encounter for antineoplastic immunotherapy: Secondary | ICD-10-CM | POA: Insufficient documentation

## 2021-07-11 DIAGNOSIS — Z79899 Other long term (current) drug therapy: Secondary | ICD-10-CM | POA: Diagnosis not present

## 2021-07-11 DIAGNOSIS — Z8551 Personal history of malignant neoplasm of bladder: Secondary | ICD-10-CM

## 2021-07-11 DIAGNOSIS — C679 Malignant neoplasm of bladder, unspecified: Secondary | ICD-10-CM

## 2021-07-11 LAB — CMP (CANCER CENTER ONLY)
ALT: 10 U/L (ref 0–44)
AST: 17 U/L (ref 15–41)
Albumin: 4.1 g/dL (ref 3.5–5.0)
Alkaline Phosphatase: 53 U/L (ref 38–126)
Anion gap: 5 (ref 5–15)
BUN: 20 mg/dL (ref 8–23)
CO2: 25 mmol/L (ref 22–32)
Calcium: 9.5 mg/dL (ref 8.9–10.3)
Chloride: 109 mmol/L (ref 98–111)
Creatinine: 1.25 mg/dL — ABNORMAL HIGH (ref 0.61–1.24)
GFR, Estimated: >60 mL/min (ref >60)
Glucose, Bld: 86 mg/dL (ref 70–99)
Potassium: 3.9 mmol/L (ref 3.5–5.1)
Sodium: 139 mmol/L (ref 135–145)
Total Bilirubin: 0.5 mg/dL (ref 0.3–1.2)
Total Protein: 7 g/dL (ref 6.5–8.1)

## 2021-07-11 LAB — CBC WITH DIFFERENTIAL (CANCER CENTER ONLY)
Abs Immature Granulocytes: 0.01 10*3/uL (ref 0.00–0.07)
Basophils Absolute: 0.1 10*3/uL (ref 0.0–0.1)
Basophils Relative: 1 %
Eosinophils Absolute: 0.2 10*3/uL (ref 0.0–0.5)
Eosinophils Relative: 2 %
HCT: 35.1 % — ABNORMAL LOW (ref 39.0–52.0)
Hemoglobin: 12.1 g/dL — ABNORMAL LOW (ref 13.0–17.0)
Immature Granulocytes: 0 %
Lymphocytes Relative: 24 %
Lymphs Abs: 1.6 10*3/uL (ref 0.7–4.0)
MCH: 29.9 pg (ref 26.0–34.0)
MCHC: 34.5 g/dL (ref 30.0–36.0)
MCV: 86.7 fL (ref 80.0–100.0)
Monocytes Absolute: 0.8 10*3/uL (ref 0.1–1.0)
Monocytes Relative: 12 %
Neutro Abs: 4 10*3/uL (ref 1.7–7.7)
Neutrophils Relative %: 61 %
Platelet Count: 207 10*3/uL (ref 150–400)
RBC: 4.05 MIL/uL — ABNORMAL LOW (ref 4.22–5.81)
RDW: 13.2 % (ref 11.5–15.5)
WBC Count: 6.5 10*3/uL (ref 4.0–10.5)
nRBC: 0 % (ref 0.0–0.2)

## 2021-07-11 LAB — TSH: TSH: 2.906 u[IU]/mL (ref 0.350–4.500)

## 2021-07-11 MED ORDER — SODIUM CHLORIDE 0.9 % IV SOLN: Freq: Once | INTRAVENOUS | Status: AC

## 2021-07-11 MED ORDER — SODIUM CHLORIDE 0.9 % IV SOLN
200.0000 mg | Freq: Once | INTRAVENOUS | Status: AC
  Administered 2021-07-11: 200 mg via INTRAVENOUS
  Filled 2021-07-11: qty 200

## 2021-07-11 NOTE — Progress Notes (Signed)
Pt is approved for the $1000 [employer] grant.  

## 2021-07-11 NOTE — Progress Notes (Signed)
Hematology and Oncology Follow Up Visit  James Barker [id number] [birth year] 71 y.o. 07/11/2021 10:16 AM James Barker, [name], [place], [name]   Principle Diagnosis: 71 year old with bladder cancer diagnosed in 2020.  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   Prior Therapy:   He is status post BCG induction which he did not tolerate at that time.  He received 6 treatments with Mitomycin-C instead.  This was given in [place].    He establish care locally after traveling from [place] and [place] and was seen by Dr. Junious Silk.  He underwent cystoscopy and transurethral resection of the prostate completed on May 11, 2021.  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  Current therapy: Pembrolizumab 200 mg every 3 weeks started on Jun 20, 2021.  He is here for second cycle of therapy.  Interim History: James Barker returns today for a follow-up visit.  Since last visit, he received the first cycle of pembrolizumab without any major complications.  He has reported some fatigue and tiredness but no other major complaints.  He did develop a small blisters on his right lower extremity and has resolved at this time.  He denies any fevers, chills or sweats or shortness of breath.  He denies any skin rash or pruritus.     Medications: Updated on review. Current Outpatient Medications  Medication Sig Dispense Refill   albuterol (PROVENTIL) (2.5 MG/3ML) 0.083% nebulizer solution Take 3 mLs (2.5 mg total) by nebulization every 6 (six) hours as needed for wheezing or shortness of breath. 225 mL 3   albuterol (VENTOLIN HFA) 108 (90 Base) MCG/ACT inhaler TAKE 2 PUFFS BY MOUTH EVERY 6 HOURS AS NEEDED FOR WHEEZE OR SHORTNESS OF BREATH 6.7 each 5   amLODipine (NORVASC) 10 MG tablet Take 1 tablet (10 mg total) by mouth daily. 30 tablet 1   apixaban (ELIQUIS) 5 MG TABS tablet Take 1 tablet (5 mg total) by mouth 2 (two) times daily. 60 tablet 1   atorvastatin (LIPITOR) 20 MG tablet Take 20  mg by mouth daily.     clotrimazole (MYCELEX) 10 MG troche Take 1 tablet (10 mg total) by mouth 5 (five) times daily. 35 Troche 0   Dupilumab (DUPIXENT) 300 MG/2ML SOPN Inject 300 mg into the skin every 14 (fourteen) days. 12 mL 1   fluticasone-salmeterol (ADVAIR HFA) 230-21 MCG/ACT inhaler Inhale 2 puffs into the lungs 2 (two) times daily. (Patient taking differently: Inhale 2 puffs into the lungs 2 (two) times daily.) 1 each 12   guaiFENesin (MUCINEX) 600 MG 12 hr tablet Take 1 tablet (600 mg total) by mouth 2 (two) times daily. (Patient taking differently: Take 600 mg by mouth 2 (two) times daily.) 60 tablet 1   Misc. Devices MISC Please dispense 1 flutter valve for use after inhalers 1 each 0   prochlorperazine (COMPAZINE) 10 MG tablet Take 1 tablet (10 mg total) by mouth every 6 (six) hours as needed for nausea or vomiting. 30 tablet 0   sodium chloride 0.9 % nebulizer solution Take 3 mLs by nebulization 3 (three) times daily as needed for wheezing. 90 mL 12   Tiotropium Bromide Monohydrate (SPIRIVA RESPIMAT) 2.5 MCG/ACT AERS Inhale 2 puffs into the lungs daily. 4 g 5   No current facility-administered medications for this visit.     Allergies:  Allergies  Allergen Reactions   Cheese Nausea And Vomiting   Lactose Intolerance (Gi) Diarrhea and Nausea And Vomiting      Physical Exam: Blood  pressure 136/75, pulse 60, temperature 97.7 F (36.5 C), temperature source Temporal, resp. rate 17, height '5\' 6"'$  (1.676 m), weight 170 lb 8 oz (77.3 kg), SpO2 99 %.  ECOG: 1    General appearance: Alert, awake without any distress. Head: Atraumatic without abnormalities Oropharynx: Without any thrush or ulcers. Eyes: No scleral icterus. Lymph nodes: No lymphadenopathy noted in the cervical, supraclavicular, or axillary nodes Heart:regular rate and rhythm, without any murmurs or gallops.   Lung: Clear to auscultation without any rhonchi, wheezes or dullness to percussion. Abdomin: Soft,  nontender without any shifting dullness or ascites. Musculoskeletal: No clubbing or cyanosis. Neurological: No motor or sensory deficits. Skin: No rashes or lesions.     Lab Results: Lab Results  Component Value Date   WBC 6.5 07/11/2021   HGB 12.1 (L) 07/11/2021   HCT 35.1 (L) 07/11/2021   MCV 86.7 07/11/2021   PLT 207 07/11/2021     Chemistry      Component Value Date/Time   NA 136 06/20/2021 1351   K 3.9 06/20/2021 1351   CL 108 06/20/2021 1351   CO2 22 06/20/2021 1351   BUN 28 (H) 06/20/2021 1351   CREATININE 1.56 (H) 06/20/2021 1351      Component Value Date/Time   CALCIUM 9.2 06/20/2021 1351   ALKPHOS 47 06/20/2021 1351   AST 21 06/20/2021 1351   ALT 14 06/20/2021 1351   BILITOT 0.6 06/20/2021 1351       Impression and Plan:   71 year old with:  1.  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  He has progressed on BCG and mitomycin.    He is currently on Pembrolizumab which she has tolerated without any major complications.  Risks and benefits of proceeding with treatment were reviewed at this time.  Complications that include arthralgias, GI toxicity and autoimmune issues were reviewed.  He is agreeable to proceed and we will schedule treatments for the next 9 to 12 weeks.  He will have a repeat cystoscopy after that.  If he has relapsed disease radical cystectomy would be considered at that time.   2.  IV access: Peripheral veins are currently in use.  Port-A-Cath option will be deferred.  3.  Antiemetics: Compazine is available to him without any nausea or vomiting.   4.  Autoimmune complications: These complications that include dermatitis, pruritus, hepatitis and hypophysitis were reviewed.   5.  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  6.  Follow-up: In 3 weeks for the next cycle of therapy.   30  minutes were spent on this  encounter.  The time was dedicated to reviewing laboratory data, disease status update and outlining future plan of care discussion.       Zola Button, MD 6/21/202310:16 AM

## 2021-07-11 NOTE — Patient Instructions (Signed)
[facility]  Discharge Instructions: Thank you for choosing [facility] to provide your oncology and hematology care.   If you have a lab appointment with the Cancer Center, please go directly to the Cancer Center and check in at the registration area.   Wear comfortable clothing and clothing appropriate for easy access to any Portacath or PICC line.   We strive to give you quality time with your provider. You may need to reschedule your appointment if you arrive late (15 or more minutes).  Arriving late affects you and other patients whose appointments are after yours.  Also, if you miss three or more appointments without notifying the office, you may be dismissed from the clinic at the provider's discretion.      For prescription refill requests, have your pharmacy contact our office and allow 72 hours for refills to be completed.    Today you received the following chemotherapy and/or immunotherapy agents: Keytruda      To help prevent nausea and vomiting after your treatment, we encourage you to take your nausea medication as directed.  BELOW ARE SYMPTOMS THAT SHOULD BE REPORTED IMMEDIATELY: *FEVER GREATER THAN 100.4 F (38 C) OR HIGHER *CHILLS OR SWEATING *NAUSEA AND VOMITING THAT IS NOT CONTROLLED WITH YOUR NAUSEA MEDICATION *UNUSUAL SHORTNESS OF BREATH *UNUSUAL BRUISING OR BLEEDING *URINARY PROBLEMS (pain or burning when urinating, or frequent urination) *BOWEL PROBLEMS (unusual diarrhea, constipation, pain near the anus) TENDERNESS IN MOUTH AND THROAT WITH OR WITHOUT PRESENCE OF ULCERS (sore throat, sores in mouth, or a toothache) UNUSUAL RASH, SWELLING OR PAIN  UNUSUAL VAGINAL DISCHARGE OR ITCHING   Items with * indicate a potential emergency and should be followed up as soon as possible or go to the Emergency Department if any problems should occur.  Please show the CHEMOTHERAPY ALERT CARD or IMMUNOTHERAPY ALERT CARD at check-in to  the Emergency Department and triage nurse.  Should you have questions after your visit or need to cancel or reschedule your appointment, please contact [facility]  Dept: [phone]  and follow the prompts.  Office hours are 8:00 a.m. to 4:30 p.m. Monday - Friday. Please note that voicemails left after 4:00 p.m. may not be returned until the following business day.  We are closed weekends and major holidays. You have access to a nurse at all times for urgent questions. Please call the main number to the clinic Dept: [phone] and follow the prompts.   For any non-urgent questions, you may also contact your provider using MyChart. We now offer e-Visits for anyone 18 and older to request care online for non-urgent symptoms. For details visit [email or web address].   Also download the MyChart app! Go to the app store, search "MyChart", open the app, select [facility], and log in with your MyChart username and password.  Masks are optional in the cancer centers. If you would like for your care team to wear a mask while they are taking care of you, please let them know. For doctor visits, patients may have with them one support person who is at least 71 years old. At this time, visitors are not allowed in the infusion area. 

## 2021-07-17 ENCOUNTER — Other Ambulatory Visit: Payer: Self-pay | Admitting: Internal Medicine

## 2021-07-18 NOTE — Telephone Encounter (Signed)
Medication for thrush was called in for pt by Dr. Shearon Stalls.

## 2021-07-19 ENCOUNTER — Other Ambulatory Visit (HOSPITAL_COMMUNITY): Payer: Self-pay

## 2021-07-19 ENCOUNTER — Telehealth: Payer: Self-pay | Admitting: Internal Medicine

## 2021-07-19 MED ORDER — APIXABAN 5 MG PO TABS: 5.0000 mg | ORAL_TABLET | Freq: Two times a day (BID) | ORAL | 1 refills | Status: DC

## 2021-07-19 NOTE — Telephone Encounter (Signed)
Called patient and verified medication that he needed refills on and pharmacy in which he wanted me to send the medication. Nothing further needed

## 2021-07-27 ENCOUNTER — Telehealth: Payer: Self-pay | Admitting: Oncology

## 2021-07-27 NOTE — Telephone Encounter (Signed)
Called patient regarding upcoming July and August appointments. Patient is notified.

## 2021-08-03 ENCOUNTER — Other Ambulatory Visit: Payer: Self-pay

## 2021-08-03 ENCOUNTER — Inpatient Hospital Stay (HOSPITAL_BASED_OUTPATIENT_CLINIC_OR_DEPARTMENT_OTHER): Payer: Medicare Other | Admitting: Oncology

## 2021-08-03 ENCOUNTER — Inpatient Hospital Stay: Payer: Medicare Other

## 2021-08-03 ENCOUNTER — Inpatient Hospital Stay: Payer: Medicare Other | Attending: Oncology

## 2021-08-03 VITALS — BP 136/73 | HR 49 | Temp 97.9°F | Resp 16

## 2021-08-03 VITALS — BP 134/76 | HR 63 | Temp 97.7°F | Resp 18 | Ht 66.0 in | Wt 170.3 lb

## 2021-08-03 DIAGNOSIS — Z5112 Encounter for antineoplastic immunotherapy: Secondary | ICD-10-CM | POA: Insufficient documentation

## 2021-08-03 DIAGNOSIS — Z79899 Other long term (current) drug therapy: Secondary | ICD-10-CM | POA: Insufficient documentation

## 2021-08-03 DIAGNOSIS — C679 Malignant neoplasm of bladder, unspecified: Secondary | ICD-10-CM

## 2021-08-03 DIAGNOSIS — Z8551 Personal history of malignant neoplasm of bladder: Secondary | ICD-10-CM

## 2021-08-03 LAB — CBC WITH DIFFERENTIAL (CANCER CENTER ONLY)
Abs Immature Granulocytes: 0.01 10*3/uL (ref 0.00–0.07)
Basophils Absolute: 0.1 10*3/uL (ref 0.0–0.1)
Basophils Relative: 1 %
Eosinophils Absolute: 0.4 10*3/uL (ref 0.0–0.5)
Eosinophils Relative: 5 %
HCT: 35.9 % — ABNORMAL LOW (ref 39.0–52.0)
Hemoglobin: 12.3 g/dL — ABNORMAL LOW (ref 13.0–17.0)
Immature Granulocytes: 0 %
Lymphocytes Relative: 20 %
Lymphs Abs: 1.5 10*3/uL (ref 0.7–4.0)
MCH: 29.6 pg (ref 26.0–34.0)
MCHC: 34.3 g/dL (ref 30.0–36.0)
MCV: 86.3 fL (ref 80.0–100.0)
Monocytes Absolute: 0.9 10*3/uL (ref 0.1–1.0)
Monocytes Relative: 13 %
Neutro Abs: 4.6 10*3/uL (ref 1.7–7.7)
Neutrophils Relative %: 61 %
Platelet Count: 190 10*3/uL (ref 150–400)
RBC: 4.16 MIL/uL — ABNORMAL LOW (ref 4.22–5.81)
RDW: 12.9 % (ref 11.5–15.5)
WBC Count: 7.5 10*3/uL (ref 4.0–10.5)
nRBC: 0 % (ref 0.0–0.2)

## 2021-08-03 LAB — CMP (CANCER CENTER ONLY)
ALT: 10 U/L (ref 0–44)
AST: 15 U/L (ref 15–41)
Albumin: 4 g/dL (ref 3.5–5.0)
Alkaline Phosphatase: 59 U/L (ref 38–126)
Anion gap: 6 (ref 5–15)
BUN: 18 mg/dL (ref 8–23)
CO2: 24 mmol/L (ref 22–32)
Calcium: 9.4 mg/dL (ref 8.9–10.3)
Chloride: 109 mmol/L (ref 98–111)
Creatinine: 1.27 mg/dL — ABNORMAL HIGH (ref 0.61–1.24)
GFR, Estimated: >60 mL/min (ref >60)
Glucose, Bld: 84 mg/dL (ref 70–99)
Potassium: 4.1 mmol/L (ref 3.5–5.1)
Sodium: 139 mmol/L (ref 135–145)
Total Bilirubin: 0.6 mg/dL (ref 0.3–1.2)
Total Protein: 7.1 g/dL (ref 6.5–8.1)

## 2021-08-03 LAB — TSH: TSH: 1.893 u[IU]/mL (ref 0.350–4.500)

## 2021-08-03 MED ORDER — SODIUM CHLORIDE 0.9 % IV SOLN: Freq: Once | INTRAVENOUS | Status: AC

## 2021-08-03 MED ORDER — SODIUM CHLORIDE 0.9 % IV SOLN
200.0000 mg | Freq: Once | INTRAVENOUS | Status: AC
  Administered 2021-08-03: 200 mg via INTRAVENOUS
  Filled 2021-08-03: qty 200

## 2021-08-03 NOTE — Patient Instructions (Signed)
[facility]  Discharge Instructions: Thank you for choosing [facility] to provide your oncology and hematology care.   If you have a lab appointment with the Cancer Center, please go directly to the Cancer Center and check in at the registration area.   Wear comfortable clothing and clothing appropriate for easy access to any Portacath or PICC line.   We strive to give you quality time with your provider. You may need to reschedule your appointment if you arrive late (15 or more minutes).  Arriving late affects you and other patients whose appointments are after yours.  Also, if you miss three or more appointments without notifying the office, you may be dismissed from the clinic at the provider's discretion.      For prescription refill requests, have your pharmacy contact our office and allow 72 hours for refills to be completed.    Today you received the following chemotherapy and/or immunotherapy agents: Keytruda      To help prevent nausea and vomiting after your treatment, we encourage you to take your nausea medication as directed.  BELOW ARE SYMPTOMS THAT SHOULD BE REPORTED IMMEDIATELY: *FEVER GREATER THAN 100.4 F (38 C) OR HIGHER *CHILLS OR SWEATING *NAUSEA AND VOMITING THAT IS NOT CONTROLLED WITH YOUR NAUSEA MEDICATION *UNUSUAL SHORTNESS OF BREATH *UNUSUAL BRUISING OR BLEEDING *URINARY PROBLEMS (pain or burning when urinating, or frequent urination) *BOWEL PROBLEMS (unusual diarrhea, constipation, pain near the anus) TENDERNESS IN MOUTH AND THROAT WITH OR WITHOUT PRESENCE OF ULCERS (sore throat, sores in mouth, or a toothache) UNUSUAL RASH, SWELLING OR PAIN  UNUSUAL VAGINAL DISCHARGE OR ITCHING   Items with * indicate a potential emergency and should be followed up as soon as possible or go to the Emergency Department if any problems should occur.  Please show the CHEMOTHERAPY ALERT CARD or IMMUNOTHERAPY ALERT CARD at check-in to  the Emergency Department and triage nurse.  Should you have questions after your visit or need to cancel or reschedule your appointment, please contact [facility]  Dept: [phone]  and follow the prompts.  Office hours are 8:00 a.m. to 4:30 p.m. Monday - Friday. Please note that voicemails left after 4:00 p.m. may not be returned until the following business day.  We are closed weekends and major holidays. You have access to a nurse at all times for urgent questions. Please call the main number to the clinic Dept: [phone] and follow the prompts.   For any non-urgent questions, you may also contact your provider using MyChart. We now offer e-Visits for anyone 18 and older to request care online for non-urgent symptoms. For details visit [email or web address].   Also download the MyChart app! Go to the app store, search "MyChart", open the app, select , and log in with your MyChart username and password.  Masks are optional in the cancer centers. If you would like for your care team to wear a mask while they are taking care of you, please let them know. For doctor visits, patients may have with them one support person who is at least 71 years old. At this time, visitors are not allowed in the infusion area. 

## 2021-08-03 NOTE — Progress Notes (Signed)
Hematology and Oncology Follow Up Visit  James Barker [id number] 06-Dec-1950 71 y.o. 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  He developed relapsed disease in 2023.Marland Kitchen   Prior Therapy:   He is status post BCG induction which he did not tolerate at that time.  He received 6 treatments with Mitomycin-C instead.  This was given in [place].    He establish care locally after traveling from [place] and [place] and was seen by Dr. Junious Silk.  He underwent cystoscopy and transurethral resection of the prostate completed on May 11, 2021.  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  Current therapy: Pembrolizumab 200 mg every 3 weeks started on Jun 20, 2021.  He returns for cycle 3 of therapy.  Interim History: Mr. Tan returns today for repeat evaluation.  Since last visit, he reports feeling well without any complaints.  He denies any complications related to Pembrolizumab.  He has reported some mild fatigue and nausea but no other complaints.  He denies any skin rash, dermatitis, shortness of breath or diarrhea.  Abdomen continues to be very soft and limited.     Medications: Reviewed without changes. Current Outpatient Medications  Medication Sig Dispense Refill   albuterol (PROVENTIL) (2.5 MG/3ML) 0.083% nebulizer solution Take 3 mLs (2.5 mg total) by nebulization every 6 (six) hours as needed for wheezing or shortness of breath. 225 mL 3   albuterol (VENTOLIN HFA) 108 (90 Base) MCG/ACT inhaler TAKE 2 PUFFS BY MOUTH EVERY 6 HOURS AS NEEDED FOR WHEEZE OR SHORTNESS OF BREATH 6.7 each 5   amLODipine (NORVASC) 10 MG tablet Take 1 tablet (10 mg total) by mouth daily. 30 tablet 1   apixaban (ELIQUIS) 5 MG TABS tablet Take 1 tablet (5 mg total) by mouth 2 (two) times daily. 60 tablet 1   atorvastatin (LIPITOR) 20 MG tablet Take 20 mg by mouth daily.      clotrimazole (MYCELEX) 10 MG troche Take 1 tablet (10 mg total) by mouth 5 (five) times daily. 35 Troche 0   Dupilumab (DUPIXENT) 300 MG/2ML SOPN Inject 300 mg into the skin every 14 (fourteen) days. 12 mL 1   fluticasone-salmeterol (ADVAIR HFA) 230-21 MCG/ACT inhaler Inhale 2 puffs into the lungs 2 (two) times daily. (Patient taking differently: Inhale 2 puffs into the lungs 2 (two) times daily.) 1 each 12   guaiFENesin (MUCINEX) 600 MG 12 hr tablet Take 1 tablet (600 mg total) by mouth 2 (two) times daily. (Patient taking differently: Take 600 mg by mouth 2 (two) times daily.) 60 tablet 1   Misc. Devices MISC Please dispense 1 flutter valve for use after inhalers 1 each 0   prochlorperazine (COMPAZINE) 10 MG tablet Take 1 tablet (10 mg total) by mouth every 6 (six) hours as needed for nausea or vomiting. 30 tablet 0   sodium chloride 0.9 % nebulizer solution Take 3 mLs by nebulization 3 (three) times daily as needed for wheezing. 90 mL 12   Tiotropium Bromide Monohydrate (SPIRIVA RESPIMAT) 2.5 MCG/ACT AERS Inhale 2 puffs into the lungs daily. 4 g 5   No current facility-administered medications for this visit.     Allergies:  Allergies  Allergen Reactions   Cheese Nausea And Vomiting   Lactose Intolerance (Gi) Diarrhea and Nausea And Vomiting      Physical Exam: Blood pressure 134/76, pulse 63, temperature 97.7 F (36.5 C), temperature source  Temporal, resp. rate 18, height '5\' 6"'$  (1.676 m), weight 170 lb 4.8 oz (77.2 kg), SpO2 99 %.   ECOG: 1   General appearance: Comfortable appearing without any discomfort Head: Normocephalic without any trauma Oropharynx: Mucous membranes are moist and pink without any thrush or ulcers. Eyes: Pupils are equal and round reactive to light. Lymph nodes: No cervical, supraclavicular, inguinal or axillary lymphadenopathy.   Heart:regular rate and rhythm.  S1 and S2 without leg edema. Lung: Clear without any rhonchi or wheezes.  No dullness to  percussion. Abdomin: Soft, nontender, nondistended with good bowel sounds.  No hepatosplenomegaly. Musculoskeletal: No joint deformity or effusion.  Full range of motion noted. Neurological: No deficits noted on motor, sensory and deep tendon reflex exam. Skin: No petechial rash or dryness.  Appeared moist.      Lab Results: Lab Results  Component Value Date   WBC 6.5 07/11/2021   HGB 12.1 (L) 07/11/2021   HCT 35.1 (L) 07/11/2021   MCV 86.7 07/11/2021   PLT 207 07/11/2021     Chemistry      Component Value Date/Time   NA 139 07/11/2021 0957   K 3.9 07/11/2021 0957   CL 109 07/11/2021 0957   CO2 25 07/11/2021 0957   BUN 20 07/11/2021 0957   CREATININE 1.25 (H) 07/11/2021 0957      Component Value Date/Time   CALCIUM 9.5 07/11/2021 0957   ALKPHOS 53 07/11/2021 0957   AST 17 07/11/2021 0957   ALT 10 07/11/2021 0957   BILITOT 0.5 07/11/2021 0957       Impression and Plan:   71 year old with:  1.  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  He has carcinoma in situ at this time.   Risks and benefits of continuing Pembrolizumab were discussed at this time.  Potential complications include nausea, fatigue as well as autoimmune concerns were reiterated.  The plan is to continue the same dose and schedule and he will have a repeat cystoscopy in the future.  Salvage therapy options including intravesicular chemotherapy radical cystectomy could be considered.  He is agreeable to continue at this time.   2.  IV access: No complications related to peripheral veins at this time noted.  Port-A-Cath will be deferred.  3.  Antiemetics: No nausea or vomiting reported at this time Compazine is available to him.   4.  Autoimmune complications: He has not experienced any at this time.  Continue to educate about pneumonitis, colitis and thyroid disease.   5.  Pulmonary embolism: No recurrent thromboembolism noted at this time.  He is currently being  anticoagulated.  This will be a lifetime duration.  6.  Follow-up: He will return in 3 weeks for the next cycle of therapy.   30  minutes were dedicated to this visit.  The time was spent on reviewing his disease status, addressing complications related to his cancer, cancer therapy and future salvage therapy options.     Zola Button, MD 7/14/202310:34 AM

## 2021-08-07 ENCOUNTER — Telehealth: Payer: Self-pay | Admitting: Oncology

## 2021-08-07 NOTE — Telephone Encounter (Signed)
Scheduled per 07/14 los, patient has been called and notified. 

## 2021-08-13 ENCOUNTER — Other Ambulatory Visit: Payer: Self-pay

## 2021-08-14 ENCOUNTER — Other Ambulatory Visit (HOSPITAL_COMMUNITY): Payer: Self-pay

## 2021-08-20 ENCOUNTER — Other Ambulatory Visit (HOSPITAL_COMMUNITY): Payer: Self-pay

## 2021-08-21 ENCOUNTER — Other Ambulatory Visit: Payer: Self-pay

## 2021-08-24 ENCOUNTER — Encounter: Payer: Self-pay | Admitting: Internal Medicine

## 2021-08-24 ENCOUNTER — Inpatient Hospital Stay: Payer: Medicare Other

## 2021-08-24 ENCOUNTER — Ambulatory Visit (INDEPENDENT_AMBULATORY_CARE_PROVIDER_SITE_OTHER): Payer: Medicare Other | Admitting: Internal Medicine

## 2021-08-24 ENCOUNTER — Inpatient Hospital Stay: Payer: Medicare Other | Attending: Oncology | Admitting: Oncology

## 2021-08-24 ENCOUNTER — Other Ambulatory Visit: Payer: Self-pay

## 2021-08-24 VITALS — BP 127/80 | HR 43 | Temp 97.5°F | Resp 15 | Wt 171.2 lb

## 2021-08-24 VITALS — BP 110/68 | HR 50 | Ht 66.0 in | Wt 170.4 lb

## 2021-08-24 DIAGNOSIS — Z8551 Personal history of malignant neoplasm of bladder: Secondary | ICD-10-CM

## 2021-08-24 DIAGNOSIS — R140 Abdominal distension (gaseous): Secondary | ICD-10-CM | POA: Insufficient documentation

## 2021-08-24 DIAGNOSIS — E079 Disorder of thyroid, unspecified: Secondary | ICD-10-CM | POA: Insufficient documentation

## 2021-08-24 DIAGNOSIS — Z7951 Long term (current) use of inhaled steroids: Secondary | ICD-10-CM | POA: Diagnosis not present

## 2021-08-24 DIAGNOSIS — K529 Noninfective gastroenteritis and colitis, unspecified: Secondary | ICD-10-CM | POA: Insufficient documentation

## 2021-08-24 DIAGNOSIS — Z7901 Long term (current) use of anticoagulants: Secondary | ICD-10-CM | POA: Insufficient documentation

## 2021-08-24 DIAGNOSIS — Z5112 Encounter for antineoplastic immunotherapy: Secondary | ICD-10-CM | POA: Diagnosis not present

## 2021-08-24 DIAGNOSIS — Z9221 Personal history of antineoplastic chemotherapy: Secondary | ICD-10-CM | POA: Insufficient documentation

## 2021-08-24 DIAGNOSIS — C689 Malignant neoplasm of urinary organ, unspecified: Secondary | ICD-10-CM

## 2021-08-24 DIAGNOSIS — I2692 Saddle embolus of pulmonary artery without acute cor pulmonale: Secondary | ICD-10-CM | POA: Diagnosis not present

## 2021-08-24 DIAGNOSIS — J449 Chronic obstructive pulmonary disease, unspecified: Secondary | ICD-10-CM

## 2021-08-24 DIAGNOSIS — Z86711 Personal history of pulmonary embolism: Secondary | ICD-10-CM | POA: Diagnosis not present

## 2021-08-24 DIAGNOSIS — Z79899 Other long term (current) drug therapy: Secondary | ICD-10-CM | POA: Diagnosis not present

## 2021-08-24 DIAGNOSIS — M7989 Other specified soft tissue disorders: Secondary | ICD-10-CM | POA: Diagnosis not present

## 2021-08-24 DIAGNOSIS — R040 Epistaxis: Secondary | ICD-10-CM

## 2021-08-24 DIAGNOSIS — C679 Malignant neoplasm of bladder, unspecified: Secondary | ICD-10-CM

## 2021-08-24 LAB — TSH: TSH: 1.299 u[IU]/mL (ref 0.350–4.500)

## 2021-08-24 LAB — CMP (CANCER CENTER ONLY)
ALT: 9 U/L (ref 0–44)
AST: 16 U/L (ref 15–41)
Albumin: 4.1 g/dL (ref 3.5–5.0)
Alkaline Phosphatase: 51 U/L (ref 38–126)
Anion gap: 5 (ref 5–15)
BUN: 26 mg/dL — ABNORMAL HIGH (ref 8–23)
CO2: 26 mmol/L (ref 22–32)
Calcium: 9.1 mg/dL (ref 8.9–10.3)
Chloride: 107 mmol/L (ref 98–111)
Creatinine: 1.41 mg/dL — ABNORMAL HIGH (ref 0.61–1.24)
GFR, Estimated: 53 mL/min — ABNORMAL LOW (ref >60)
Glucose, Bld: 89 mg/dL (ref 70–99)
Potassium: 4.2 mmol/L (ref 3.5–5.1)
Sodium: 138 mmol/L (ref 135–145)
Total Bilirubin: 0.5 mg/dL (ref 0.3–1.2)
Total Protein: 7.2 g/dL (ref 6.5–8.1)

## 2021-08-24 LAB — CBC WITH DIFFERENTIAL (CANCER CENTER ONLY)
Abs Immature Granulocytes: 0.01 10*3/uL (ref 0.00–0.07)
Basophils Absolute: 0.1 10*3/uL (ref 0.0–0.1)
Basophils Relative: 1 %
Eosinophils Absolute: 0.3 10*3/uL (ref 0.0–0.5)
Eosinophils Relative: 5 %
HCT: 37 % — ABNORMAL LOW (ref 39.0–52.0)
Hemoglobin: 12.5 g/dL — ABNORMAL LOW (ref 13.0–17.0)
Immature Granulocytes: 0 %
Lymphocytes Relative: 23 %
Lymphs Abs: 1.5 10*3/uL (ref 0.7–4.0)
MCH: 29 pg (ref 26.0–34.0)
MCHC: 33.8 g/dL (ref 30.0–36.0)
MCV: 85.8 fL (ref 80.0–100.0)
Monocytes Absolute: 0.7 10*3/uL (ref 0.1–1.0)
Monocytes Relative: 12 %
Neutro Abs: 3.7 10*3/uL (ref 1.7–7.7)
Neutrophils Relative %: 59 %
Platelet Count: 202 10*3/uL (ref 150–400)
RBC: 4.31 MIL/uL (ref 4.22–5.81)
RDW: 13.1 % (ref 11.5–15.5)
WBC Count: 6.2 10*3/uL (ref 4.0–10.5)
nRBC: 0 % (ref 0.0–0.2)

## 2021-08-24 MED ORDER — APIXABAN 5 MG PO TABS: 5.0000 mg | ORAL_TABLET | Freq: Two times a day (BID) | ORAL | 2 refills | Status: DC

## 2021-08-24 MED ORDER — SODIUM CHLORIDE 0.9 % IV SOLN: Freq: Once | INTRAVENOUS | Status: AC

## 2021-08-24 MED ORDER — SODIUM CHLORIDE 0.9 % IV SOLN
200.0000 mg | Freq: Once | INTRAVENOUS | Status: AC
  Administered 2021-08-24: 200 mg via INTRAVENOUS
  Filled 2021-08-24: qty 200

## 2021-08-24 MED ORDER — PROCHLORPERAZINE MALEATE 10 MG PO TABS: 10.0000 mg | ORAL_TABLET | Freq: Four times a day (QID) | ORAL | 3 refills | Status: DC | PRN

## 2021-08-24 NOTE — Patient Instructions (Addendum)
Please schedule follow up scheduled with myself in 3 months.  If my schedule is not open yet, we will contact you with a reminder closer to that time. Please call [phone] if you haven't heard from [place] a month before.    Start using a spacer with your advair 2 puffs twice a day. gargle with an alcohol based mouthwash after use. This will help the thrush.   Use your albuterol neb treatments at home and keep your inhaler for outside the home use.  Continue advair, spiriva, albuterol and mucinex as it helps. Use flutter valve to help clear mucus.  Continue dupixent. The fact that you haven't needed prednisone in 4 months means it's working!  Let me know if bleeding worsens. Keep taking eliquis for now.

## 2021-08-24 NOTE — Progress Notes (Signed)
James Barker    [id number]    1950-07-16  Primary Care Physician:[name], [name], PA Date of Appointment: 08/24/2021 Established Patient Visit  Chief complaint:   Chief Complaint  Patient presents with   Follow-up    Pt f/u feels like he's dong okay inhalers are wokring well. Treated for thrush LOV and pt states he feels like it's still on-going     HPI: James Barker is a 71 y.o. man with history of seere allergic asthma COPD overlap syndrome.Dupixent started 04/26/2021.  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 He is on chemotherapy with this.   Interval Updates: Here for follow up for severe asthma copd overlap syndrome. Been on dupixent for four months now. No prednisone courses since starting dupixent. Continues to have daily chronic cough. Usually bringing up mucus. No hemoptysis.   He is having regular nose bleeds after chemotherapy every 3 weeks. The nose bleed stops fairly quickly with home remedies and manual pressure. He forgets to take his anti-emetics prior to chemo sometimes.   He is also having swelling in his legs and having pins and needles in his legs and has dependent edema at the end of the day.   Current Regimen: spiriva, advair, mucinex,  prn albuterol usually takes 3-4 times/day. dupixent.  Asthma Triggers: allergies, viral infections.  Exacerbations in the last year: 5-6 requiring predisone prior to starting dupixent.  History of hospitalization or intubation: yes in [place] Allergy Testing: sensitization to aspergillus in 2023 on immunocap testing GERD: denies Allergic Rhinitis: occasional ACT:  Asthma Control Test ACT Total Score  08/24/2021  9:56 AM 14  05/24/2021  9:57 AM 11  12/22/2020  8:59 AM 17   FeNO: 53 ppb   I have reviewed the patient's family social and past medical history and updated as appropriate.   Past Medical History:  Diagnosis Date   Arthritis     Asthma-COPD overlap syndrome [facility])    pulmonology--- dr n Aayliah Rotenberry;   last exacerbation 05-08-2021   Benign localized prostatic hyperplasia with lower urinary tract symptoms (LUTS)    Chronic cough    due to copd   Dairy product intolerance    DOE (dyspnea on exertion)    d/t asthma/ COPD   Emphysema, unspecified ([place])    Full dentures    History of bladder cancer 2020   s/p TURBT w/ chemo instillation   History of COVID-19    per pt 2021 (no at same time of pneumonia)  w/ moderate symptoms , recovered at home, that resolved back to baseline   History of hepatitis C 2018   hx chronic hepatitis C,  per pt treated and told was cured   History of latent tuberculosis 2017   pt tested positive for TB exposure, not active, treated w/ INH for 9 months   History of MAI infection 2021   never treated   History of pulmonary embolism    (05-09-2021  pt stated never had clot prior to 2017 , none since 2020, had negative blood disorder work-up)//  2017  treated w/ xarelto 6 months//  and recurrent 04/ 2020 in setting severe UTI and deconditioning w/ prolonged admission due to intolerant to anticoagulants IVC filter inserted   Hypertension    OSA (obstructive sleep apnea)    per pt dx yrs ago, intolerant to cpap   Pulmonary eosinophilia (HCC)    S/P IVC filter  2020   for PE   Weak urinary stream    Wears glasses     Past Surgical History:  Procedure Laterality Date   ANAL FISTULECTOMY     x5  last one 2008   BRONCHOSCOPY  2021   per pt had pneumonia   (done in [place])   [place] Right 2010   IVC FILTER INSERTION  2020   in [place]   ORIF WRIST FRACTURE Left 2016   SHOULDER ARTHROSCOPY W/ ROTATOR CUFF REPAIR Left 2016   [place] TUMOR  2020   in [place]  2021   in [place] N/A 05/11/2021   Procedure: PROSTATE EXAM UNDER ANESTHESIA; TRANSURETHRAL RESECTION OF THE PROSTATE (TURP), BLADDER  BIOPSY;  Surgeon: Festus Aloe, MD;  Location: [facility];  Service: Urology;  Laterality: N/A;   UMBILICAL HERNIA REPAIR  2011    Family History  Problem Relation Age of Onset   Pulmonary embolism Mother    Emphysema Father    Lung cancer Sister    Asthma Child    Asthma Child     Social History   Occupational History   Not on file  Tobacco Use   Smoking status: Former    Packs/day: 1.00    Years: 20.00    Total pack years: 20.00    Types: Cigarettes    Quit date: 70    Years since quitting: 31.6   Smokeless tobacco: Never  Vaping Use   Vaping Use: Never used  Substance and Sexual Activity   Alcohol use: Not Currently   Drug use: Yes    Types: Marijuana    Comment: 05-09-2021  per pt smokes occasional   Sexual activity: Not on file     Physical Exam: Blood pressure 110/68, pulse (!) 50, height '5\' 6"'$  (1.676 m), weight 170 lb 6.4 oz (77.3 kg), SpO2 99 %.  Gen: fatigued, no distress CV: bradycardic, regular Resp: clear, occasional rhonchi that clear with coughing.  ENT: no thrush.   Data Reviewed: Imaging: I have personally reviewed the CTPE study 05/15/21 which shows bilateral segmental and subsegmental PE with known bronchial thickening consistent with chronic bronchitis.   PFTs:     Latest Ref Rng & Units 02/20/2021    2:58 PM  PFT Results  FVC-Pre L 3.20   FVC-Predicted Pre % 84   FVC-Post L 3.30   FVC-Predicted Post % 87   Pre FEV1/FVC % % 49   Post FEV1/FCV % % 51   FEV1-Pre L 1.57   FEV1-Predicted Pre % 56   FEV1-Post L 1.70   DLCO uncorrected ml/min/mmHg 14.74   DLCO UNC% % 64   DLCO corrected ml/min/mmHg 14.74   DLCO COR %Predicted % 64   DLVA Predicted % 67   TLC L 6.54   TLC % Predicted % 105   RV % Predicted % 131    I have personally reviewed the patient's PFTs and moderately severe airflow limitation. Reduced diffusion capacity  Labs:  Immunization status: Immunization History  Administered Date(s)  Administered   Influenza, High Dose Seasonal PF 12/08/2020   Moderna Sars-Covid-2 Vaccination 03/25/2019, 04/22/2019, 09/30/2019   Pneumococcal-Unspecified 12/08/2020    External Records Personally Reviewed: oncology   Assessment:  Allergic Asthma COPD Overlap Syndrome on dupixent. Improved control Radiographic emphysema Acinetobacter colonization Acute pulmonary embolism on eliquis, provoked in the setting of bladder cancer Epistaxis  Plan/Recommendations: For now nebulizer  therapy with saline and albuterol with flutter valve. Continue spiriva, advair and dupixent.  Start using a spacer with your advair to prevent thrush. Gargle with alcohol based mouthwash.  Needs to continue eliquis - we discussed risks and  benefits. For now epistaxis is mild and the benefits ar outweighed by the risks of anticoagulation.  No prednisone use since he's been on dupixent! This is considered a positive response to treatment.    Return to Care: Return in about 3 months (around 11/24/2021).   Lenice Llamas, MD Pulmonary and [place]

## 2021-08-24 NOTE — Progress Notes (Signed)
Hematology and Oncology Follow Up Visit  James Barker [id number] March 16, 1950 71 y.o. 08/24/2021 11:36 AM [name], [name], [name], [name]   Principle Diagnosis: 71 year old with bladder cancer diagnosed in 2020.  He presented with carcinoma in situ and subsequently developed BCG refractory relapsed disease in 2023.   Prior Therapy:   He is status post BCG induction which he did not tolerate at that time.  He received 6 treatments with Mitomycin-C instead.  This was given in [place].    He establish care locally after traveling from [place] and [place] and was seen by Dr. Junious Silk.  He underwent cystoscopy and transurethral resection of the prostate completed on May 11, 2021.  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  Current therapy: Pembrolizumab 200 mg every 3 weeks started on Jun 20, 2021.  He returns for cycle 4 of therapy.  Interim History: James Barker presents today for a follow-up.  Since the last visit, he reports feeling well without any major complaints.  He has reported some minor complaints related to Pembrolizumab.Marland Kitchen  He has reported some fatigue and occasional tiredness.  He has reported some occasional bloating and lower extremity swelling.  He denies vomiting or weight loss.  He has reported some mild nausea that is manageable with Compazine.  He denies any urinary frequency or urgency.  He denies any hematuria or dysuria.     Medications: Updated on review Current Outpatient Medications  Medication Sig Dispense Refill   albuterol (PROVENTIL) (2.5 MG/3ML) 0.083% nebulizer solution Take 3 mLs (2.5 mg total) by nebulization every 6 (six) hours as needed for wheezing or shortness of breath. 225 mL 3   albuterol (VENTOLIN HFA) 108 (90 Base) MCG/ACT inhaler TAKE 2 PUFFS BY MOUTH EVERY 6 HOURS AS NEEDED FOR WHEEZE OR SHORTNESS OF BREATH 6.7 each 5   amLODipine (NORVASC) 10 MG tablet Take 1 tablet (10 mg total) by mouth daily. 30 tablet 1   apixaban (ELIQUIS) 5  MG TABS tablet Take 1 tablet (5 mg total) by mouth 2 (two) times daily. 60 tablet 1   atorvastatin (LIPITOR) 20 MG tablet Take 20 mg by mouth daily.     clotrimazole (MYCELEX) 10 MG troche Take 1 tablet (10 mg total) by mouth 5 (five) times daily. (Patient not taking: Reported on 08/24/2021) 35 Troche 0   Dupilumab (DUPIXENT) 300 MG/2ML SOPN Inject 300 mg into the skin every 14 (fourteen) days. 12 mL 1   fluticasone-salmeterol (ADVAIR HFA) 230-21 MCG/ACT inhaler Inhale 2 puffs into the lungs 2 (two) times daily. (Patient taking differently: Inhale 2 puffs into the lungs 2 (two) times daily.) 1 each 12   guaiFENesin (MUCINEX) 600 MG 12 hr tablet Take 1 tablet (600 mg total) by mouth 2 (two) times daily. (Patient not taking: Reported on 08/24/2021) 60 tablet 1   Misc. Devices MISC Please dispense 1 flutter valve for use after inhalers (Patient not taking: Reported on 08/24/2021) 1 each 0   prochlorperazine (COMPAZINE) 10 MG tablet Take 1 tablet (10 mg total) by mouth every 6 (six) hours as needed for nausea or vomiting. 30 tablet 0   sodium chloride 0.9 % nebulizer solution Take 3 mLs by nebulization 3 (three) times daily as needed for wheezing. 90 mL 12   Tiotropium Bromide Monohydrate (SPIRIVA RESPIMAT) 2.5 MCG/ACT AERS Inhale 2 puffs into the lungs daily. 4 g 5   No current facility-administered medications for this visit.     Allergies:  Allergies  Allergen Reactions  Cheese Nausea And Vomiting   Lactose Intolerance (Gi) Diarrhea and Nausea And Vomiting      Physical Exam:  Blood pressure 127/80, pulse (!) 43, temperature (!) 97.5 F (36.4 C), temperature source Temporal, resp. rate 15, weight 171 lb 3.2 oz (77.7 kg), SpO2 100 %.   ECOG: 1    General appearance: Alert, awake without any distress. Head: Atraumatic without abnormalities Oropharynx: Without any thrush or ulcers. Eyes: No scleral icterus. Lymph nodes: No lymphadenopathy noted in the cervical, supraclavicular, or  axillary nodes Heart:regular rate and rhythm, without any murmurs or gallops.   Lung: Clear to auscultation without any rhonchi, wheezes or dullness to percussion. Abdomin: Soft, nontender without any shifting dullness or ascites. Musculoskeletal: No clubbing or cyanosis. Neurological: No motor or sensory deficits. Skin: No rashes or lesions.     Lab Results: Lab Results  Component Value Date   WBC 7.5 08/03/2021   HGB 12.3 (L) 08/03/2021   HCT 35.9 (L) 08/03/2021   MCV 86.3 08/03/2021   PLT 190 08/03/2021     Chemistry      Component Value Date/Time   NA 139 08/03/2021 1034   K 4.1 08/03/2021 1034   CL 109 08/03/2021 1034   CO2 24 08/03/2021 1034   BUN 18 08/03/2021 1034   CREATININE 1.27 (H) 08/03/2021 1034      Component Value Date/Time   CALCIUM 9.4 08/03/2021 1034   ALKPHOS 59 08/03/2021 1034   AST 15 08/03/2021 1034   ALT 10 08/03/2021 1034   BILITOT 0.6 08/03/2021 1034       Impression and Plan:   71 year old with:  1.  Bladder cancer diagnosed in 2020.  He developed BCG refractory without muscle invasion in 2023.  He is not currently on Pembrolizumab which she has tolerated very well without any major complaints.  Risks and benefits of continuing this treatment were discussed.  Alternative treatment options including intravesicular chemotherapy and radical cystectomy were reviewed.  At this time he is agreeable to continuing we will have a repeat cystoscopy in the near future.   2.  IV access: Port-A-Cath option has been deferred at this time.  Peripheral veins are currently in use.  3.  Antiemetics: Compazine is available without any nausea or vomiting.  This has been effective in controlling her symptoms.   4.  Autoimmune complications: No complications noted at this time.  These include pneumonitis, colitis and thyroid disease.   5.  Pulmonary embolism: He is currently on lifetime anticoagulation.  I recommended continuing this for the time being  given his risk of thrombosis.  6.  Follow-up: In 3 weeks for the next cycle of therapy.   30  minutes were spent on this encounter.  The time was dedicated to reviewing laboratory data, disease status update and outlining future plan of care discussion.     Zola Button, MD 8/4/202311:36 AM

## 2021-08-24 NOTE — Patient Instructions (Signed)
[facility] MEDICAL [facility]COLOGY  Discharge Instructions: Thank you for choosing [facility] to provide your oncology and hematology care.   If you have a lab appointment with the Cancer Center, please go directly to the Cancer Center and check in at the registration area.   Wear comfortable clothing and clothing appropriate for easy access to any Portacath or PICC line.   We strive to give you quality time with your provider. You may need to reschedule your appointment if you arrive late (15 or more minutes).  Arriving late affects you and other patients whose appointments are after yours.  Also, if you miss three or more appointments without notifying the office, you may be dismissed from the clinic at the provider's discretion.      For prescription refill requests, have your pharmacy contact our office and allow 72 hours for refills to be completed.    Today you received the following chemotherapy and/or immunotherapy agents: Keytruda      To help prevent nausea and vomiting after your treatment, we encourage you to take your nausea medication as directed.  BELOW ARE SYMPTOMS THAT SHOULD BE REPORTED IMMEDIATELY: *FEVER GREATER THAN 100.4 F (38 C) OR HIGHER *CHILLS OR SWEATING *NAUSEA AND VOMITING THAT IS NOT CONTROLLED WITH YOUR NAUSEA MEDICATION *UNUSUAL SHORTNESS OF BREATH *UNUSUAL BRUISING OR BLEEDING *URINARY PROBLEMS (pain or burning when urinating, or frequent urination) *BOWEL PROBLEMS (unusual diarrhea, constipation, pain near the anus) TENDERNESS IN MOUTH AND THROAT WITH OR WITHOUT PRESENCE OF ULCERS (sore throat, sores in mouth, or a toothache) UNUSUAL RASH, SWELLING OR PAIN  UNUSUAL VAGINAL DISCHARGE OR ITCHING   Items with * indicate a potential emergency and should be followed up as soon as possible or go to the Emergency Department if any problems should occur.  Please show the CHEMOTHERAPY ALERT CARD or IMMUNOTHERAPY ALERT CARD at check-in to  the Emergency Department and triage nurse.  Should you have questions after your visit or need to cancel or reschedule your appointment, please contact [facility]COL[facility]  Dept: [phone]  and follow the prompts.  Office hours are 8:00 a.m. to 4:30 p.m. Monday - Friday. Please note that voicemails left after 4:00 p.m. may not be returned until the following business day.  We are closed weekends and major holidays. You have access to a nurse at all times for urgent questions. Please call the main number to the clinic Dept: [phone] and follow the prompts.   For any non-urgent questions, you may also contact your provider using MyChart. We now offer e-Visits for anyone 18 and older to request care online for non-urgent symptoms. For details visit [email or web address].   Also download the MyChart app! Go to the app store, search "MyChart", open the app, select [facility], and log in with your MyChart username and password.  Masks are optional in the cancer centers. If you would like for your care team to wear a mask while they are taking care of you, please let them know. For doctor visits, patients may have with them one support person who is at least 71 years old. At this time, visitors are not allowed in the infusion area. 

## 2021-08-27 ENCOUNTER — Ambulatory Visit: Payer: Medicare Other | Admitting: Oncology

## 2021-08-27 ENCOUNTER — Ambulatory Visit: Payer: Medicare Other

## 2021-08-27 ENCOUNTER — Other Ambulatory Visit: Payer: Medicare Other

## 2021-08-28 ENCOUNTER — Other Ambulatory Visit: Payer: Self-pay

## 2021-08-29 ENCOUNTER — Other Ambulatory Visit: Payer: Self-pay

## 2021-08-31 ENCOUNTER — Other Ambulatory Visit: Payer: Self-pay | Admitting: Oncology

## 2021-08-31 DIAGNOSIS — C679 Malignant neoplasm of bladder, unspecified: Secondary | ICD-10-CM

## 2021-09-02 ENCOUNTER — Other Ambulatory Visit: Payer: Self-pay | Admitting: Oncology

## 2021-09-14 ENCOUNTER — Other Ambulatory Visit: Payer: Self-pay

## 2021-09-14 ENCOUNTER — Inpatient Hospital Stay: Payer: Medicare Other

## 2021-09-14 ENCOUNTER — Inpatient Hospital Stay (HOSPITAL_BASED_OUTPATIENT_CLINIC_OR_DEPARTMENT_OTHER): Payer: Medicare Other | Admitting: Oncology

## 2021-09-14 VITALS — BP 124/80 | HR 60 | Temp 97.8°F | Resp 17 | Ht 66.0 in | Wt 170.6 lb

## 2021-09-14 VITALS — BP 137/80 | HR 62 | Resp 14

## 2021-09-14 DIAGNOSIS — Z79899 Other long term (current) drug therapy: Secondary | ICD-10-CM | POA: Diagnosis not present

## 2021-09-14 DIAGNOSIS — Z7951 Long term (current) use of inhaled steroids: Secondary | ICD-10-CM | POA: Diagnosis not present

## 2021-09-14 DIAGNOSIS — Z5112 Encounter for antineoplastic immunotherapy: Secondary | ICD-10-CM | POA: Diagnosis not present

## 2021-09-14 DIAGNOSIS — K529 Noninfective gastroenteritis and colitis, unspecified: Secondary | ICD-10-CM | POA: Diagnosis not present

## 2021-09-14 DIAGNOSIS — E079 Disorder of thyroid, unspecified: Secondary | ICD-10-CM | POA: Diagnosis not present

## 2021-09-14 DIAGNOSIS — Z7901 Long term (current) use of anticoagulants: Secondary | ICD-10-CM | POA: Diagnosis not present

## 2021-09-14 DIAGNOSIS — Z8551 Personal history of malignant neoplasm of bladder: Secondary | ICD-10-CM

## 2021-09-14 DIAGNOSIS — C679 Malignant neoplasm of bladder, unspecified: Secondary | ICD-10-CM

## 2021-09-14 DIAGNOSIS — Z86711 Personal history of pulmonary embolism: Secondary | ICD-10-CM | POA: Diagnosis not present

## 2021-09-14 DIAGNOSIS — Z9221 Personal history of antineoplastic chemotherapy: Secondary | ICD-10-CM | POA: Diagnosis not present

## 2021-09-14 DIAGNOSIS — M7989 Other specified soft tissue disorders: Secondary | ICD-10-CM | POA: Diagnosis not present

## 2021-09-14 DIAGNOSIS — R140 Abdominal distension (gaseous): Secondary | ICD-10-CM | POA: Diagnosis not present

## 2021-09-14 LAB — CMP (CANCER CENTER ONLY)
ALT: 10 U/L (ref 0–44)
AST: 17 U/L (ref 15–41)
Albumin: 4.2 g/dL (ref 3.5–5.0)
Alkaline Phosphatase: 52 U/L (ref 38–126)
Anion gap: 4 — ABNORMAL LOW (ref 5–15)
BUN: 20 mg/dL (ref 8–23)
CO2: 24 mmol/L (ref 22–32)
Calcium: 9.4 mg/dL (ref 8.9–10.3)
Chloride: 109 mmol/L (ref 98–111)
Creatinine: 1.19 mg/dL (ref 0.61–1.24)
GFR, Estimated: >60 mL/min (ref >60)
Glucose, Bld: 135 mg/dL — ABNORMAL HIGH (ref 70–99)
Potassium: 3.9 mmol/L (ref 3.5–5.1)
Sodium: 137 mmol/L (ref 135–145)
Total Bilirubin: 0.6 mg/dL (ref 0.3–1.2)
Total Protein: 6.8 g/dL (ref 6.5–8.1)

## 2021-09-14 LAB — CBC WITH DIFFERENTIAL (CANCER CENTER ONLY)
Abs Immature Granulocytes: 0.02 10*3/uL (ref 0.00–0.07)
Basophils Absolute: 0.1 10*3/uL (ref 0.0–0.1)
Basophils Relative: 1 %
Eosinophils Absolute: 0.2 10*3/uL (ref 0.0–0.5)
Eosinophils Relative: 2 %
HCT: 36.1 % — ABNORMAL LOW (ref 39.0–52.0)
Hemoglobin: 12.6 g/dL — ABNORMAL LOW (ref 13.0–17.0)
Immature Granulocytes: 0 %
Lymphocytes Relative: 21 %
Lymphs Abs: 1.7 10*3/uL (ref 0.7–4.0)
MCH: 29.6 pg (ref 26.0–34.0)
MCHC: 34.9 g/dL (ref 30.0–36.0)
MCV: 84.9 fL (ref 80.0–100.0)
Monocytes Absolute: 0.7 10*3/uL (ref 0.1–1.0)
Monocytes Relative: 9 %
Neutro Abs: 5.3 10*3/uL (ref 1.7–7.7)
Neutrophils Relative %: 67 %
Platelet Count: 210 10*3/uL (ref 150–400)
RBC: 4.25 MIL/uL (ref 4.22–5.81)
RDW: 13.1 % (ref 11.5–15.5)
WBC Count: 7.9 10*3/uL (ref 4.0–10.5)
nRBC: 0 % (ref 0.0–0.2)

## 2021-09-14 LAB — TSH: TSH: 1.659 u[IU]/mL (ref 0.350–4.500)

## 2021-09-14 MED ORDER — SODIUM CHLORIDE 0.9 % IV SOLN: Freq: Once | INTRAVENOUS | Status: AC

## 2021-09-14 MED ORDER — SODIUM CHLORIDE 0.9 % IV SOLN
200.0000 mg | Freq: Once | INTRAVENOUS | Status: AC
  Administered 2021-09-14: 200 mg via INTRAVENOUS
  Filled 2021-09-14: qty 8

## 2021-09-14 NOTE — Patient Instructions (Signed)
[facility]   Discharge Instructions: Thank you for choosing [facility] to provide your oncology and hematology care.   If you have a lab appointment with the Cancer Center, please go directly to the Cancer Center and check in at the registration area.   Wear comfortable clothing and clothing appropriate for easy access to any Portacath or PICC line.   We strive to give you quality time with your provider. You may need to reschedule your appointment if you arrive late (15 or more minutes).  Arriving late affects you and other patients whose appointments are after yours.  Also, if you miss three or more appointments without notifying the office, you may be dismissed from the clinic at the provider's discretion.      For prescription refill requests, have your pharmacy contact our office and allow 72 hours for refills to be completed.    Today you received the following chemotherapy and/or immunotherapy agents: Pembrolizumab (Keytruda)      To help prevent nausea and vomiting after your treatment, we encourage you to take your nausea medication as directed.  BELOW ARE SYMPTOMS THAT SHOULD BE REPORTED IMMEDIATELY: *FEVER GREATER THAN 100.4 F (38 C) OR HIGHER *CHILLS OR SWEATING *NAUSEA AND VOMITING THAT IS NOT CONTROLLED WITH YOUR NAUSEA MEDICATION *UNUSUAL SHORTNESS OF BREATH *UNUSUAL BRUISING OR BLEEDING *URINARY PROBLEMS (pain or burning when urinating, or frequent urination) *BOWEL PROBLEMS (unusual diarrhea, constipation, pain near the anus) TENDERNESS IN MOUTH AND THROAT WITH OR WITHOUT PRESENCE OF ULCERS (sore throat, sores in mouth, or a toothache) UNUSUAL RASH, SWELLING OR PAIN  UNUSUAL VAGINAL DISCHARGE OR ITCHING   Items with * indicate a potential emergency and should be followed up as soon as possible or go to the Emergency Department if any problems should occur.  Please show the CHEMOTHERAPY ALERT CARD or IMMUNOTHERAPY ALERT  CARD at check-in to the Emergency Department and triage nurse.  Should you have questions after your visit or need to cancel or reschedule your appointment, please contact [facility]  Dept: [phone]  and follow the prompts.  Office hours are 8:00 a.m. to 4:30 p.m. Monday - Friday. Please note that voicemails left after 4:00 p.m. may not be returned until the following business day.  We are closed weekends and major holidays. You have access to a nurse at all times for urgent questions. Please call the main number to the clinic Dept: [phone] and follow the prompts.   For any non-urgent questions, you may also contact your provider using MyChart. We now offer e-Visits for anyone 18 and older to request care online for non-urgent symptoms. For details visit [email or web address].   Also download the MyChart app! Go to the app store, search "MyChart", open the app, select [facility], and log in with your MyChart username and password.  Masks are optional in the cancer centers. If you would like for your care team to wear a mask while they are taking care of you, please let them know. You may have one support person who is at least 71 years old accompany you for your appointments. 

## 2021-09-14 NOTE — Progress Notes (Signed)
Hematology and Oncology Follow Up Visit  James Barker [id number] Apr 27, 1950 71 y.o. 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  He initially presented with diagnosed in 2020.  He presented with carcinoma in situ in 2020.   Prior Therapy:   He is status post BCG induction which he did not tolerate at that time.  He received 6 treatments with Mitomycin-C instead.  This was given in [place].    He establish care locally after traveling from [place] and [place] and was seen by Dr. Junious Silk.  He underwent cystoscopy and transurethral resection of the prostate completed on May 11, 2021.  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  Current therapy: Pembrolizumab 200 mg every 3 weeks started on Jun 20, 2021.  He returns for cycle 5 of therapy.  Interim History: James Barker returns today for a repeat evaluation.  Since the last visit, he reports feeling well without any major complaints.  He tolerated Pembrolizumab infusion with some mild fatigue and anorexia but resolves spontaneously complete.  He denies any skin rash or lesions.  He denies any major changes in his bowel habits.     Medications: Reviewed without changes. Current Outpatient Medications  Medication Sig Dispense Refill   albuterol (PROVENTIL) (2.5 MG/3ML) 0.083% nebulizer solution Take 3 mLs (2.5 mg total) by nebulization every 6 (six) hours as needed for wheezing or shortness of breath. 225 mL 3   albuterol (VENTOLIN HFA) 108 (90 Base) MCG/ACT inhaler TAKE 2 PUFFS BY MOUTH EVERY 6 HOURS AS NEEDED FOR WHEEZE OR SHORTNESS OF BREATH 6.7 each 5   amLODipine (NORVASC) 10 MG tablet Take 1 tablet (10 mg total) by mouth daily. 30 tablet 1   apixaban (ELIQUIS) 5 MG TABS tablet Take 1 tablet (5 mg total) by mouth 2 (two) times daily. 60 tablet 2   atorvastatin (LIPITOR) 20 MG tablet Take 20 mg by mouth daily.     clotrimazole  (MYCELEX) 10 MG troche Take 1 tablet (10 mg total) by mouth 5 (five) times daily. (Patient not taking: Reported on 08/24/2021) 35 Troche 0   Dupilumab (DUPIXENT) 300 MG/2ML SOPN Inject 300 mg into the skin every 14 (fourteen) days. 12 mL 1   fluticasone-salmeterol (ADVAIR HFA) 230-21 MCG/ACT inhaler Inhale 2 puffs into the lungs 2 (two) times daily. (Patient taking differently: Inhale 2 puffs into the lungs 2 (two) times daily.) 1 each 12   guaiFENesin (MUCINEX) 600 MG 12 hr tablet Take 1 tablet (600 mg total) by mouth 2 (two) times daily. (Patient not taking: Reported on 08/24/2021) 60 tablet 1   Misc. Devices MISC Please dispense 1 flutter valve for use after inhalers (Patient not taking: Reported on 08/24/2021) 1 each 0   prochlorperazine (COMPAZINE) 10 MG tablet Take 1 tablet (10 mg total) by mouth every 6 (six) hours as needed for nausea or vomiting. 30 tablet 3   sodium chloride 0.9 % nebulizer solution Take 3 mLs by nebulization 3 (three) times daily as needed for wheezing. 90 mL 12   Tiotropium Bromide Monohydrate (SPIRIVA RESPIMAT) 2.5 MCG/ACT AERS Inhale 2 puffs into the lungs daily. 4 g 5   No current facility-administered medications for this visit.     Allergies:  Allergies  Allergen Reactions   Cheese Nausea And Vomiting   Lactose Intolerance (Gi) Diarrhea and Nausea And Vomiting      Physical Exam:  Blood pressure 124/80, pulse 60,  temperature 97.8 F (36.6 C), temperature source Temporal, resp. rate 17, height '5\' 6"'$  (1.676 m), weight 170 lb 9.6 oz (77.4 kg), SpO2 98 %.    ECOG: 1   General appearance: Comfortable appearing without any discomfort Head: Normocephalic without any trauma Oropharynx: Mucous membranes are moist and pink without any thrush or ulcers. Eyes: Pupils are equal and round reactive to light. Lymph nodes: No cervical, supraclavicular, inguinal or axillary lymphadenopathy.   Heart:regular rate and rhythm.  S1 and S2 without leg edema. Lung: Clear  without any rhonchi or wheezes.  No dullness to percussion. Abdomin: Soft, nontender, nondistended with good bowel sounds.  No hepatosplenomegaly. Musculoskeletal: No joint deformity or effusion.  Full range of motion noted. Neurological: No deficits noted on motor, sensory and deep tendon reflex exam. Skin: No petechial rash or dryness.  Appeared moist.      Lab Results: Lab Results  Component Value Date   WBC 6.2 08/24/2021   HGB 12.5 (L) 08/24/2021   HCT 37.0 (L) 08/24/2021   MCV 85.8 08/24/2021   PLT 202 08/24/2021     Chemistry      Component Value Date/Time   NA 138 08/24/2021 1119   K 4.2 08/24/2021 1119   CL 107 08/24/2021 1119   CO2 26 08/24/2021 1119   BUN 26 (H) 08/24/2021 1119   CREATININE 1.41 (H) 08/24/2021 1119      Component Value Date/Time   CALCIUM 9.1 08/24/2021 1119   ALKPHOS 51 08/24/2021 1119   AST 16 08/24/2021 1119   ALT 9 08/24/2021 1119   BILITOT 0.5 08/24/2021 1119       Impression and Plan:   71 year old with:  1.  BCG refractory bladder cancer diagnosed in 2023.  Initially presented with carcinoma in situ in 2020.  He is currently on Pembrolizumab after developing BCG refractory disease.  Alternative treatment options including radical cystectomy as well as intravesicular chemotherapy.  Complication associated with Pembrolizumab were reviewed which include nausea, fatigue and autoimmune considerations.  He is agreeable to proceed and he is planned to have a cystoscopy in September 2023.  The duration of therapy will be determined after that.   2.  IV access: Peripheral veins are currently in use with Port-A-Cath deferred at this time.  3.  Antiemetics: Compazine is available to him   4.  Autoimmune complications: I continue to educate him about potential complication including pneumonitis, colitis and thyroid disease.  He has not experienced any at this time.   5.  Pulmonary embolism: He continues to be on Eliquis and I recommended  continuing this indefinitely.  6.  Follow-up: He will return in 3 weeks for a follow-up.   30  minutes were dedicated to this visit.  The time was spent on reviewing laboratory data, disease status update and addressing complications related to cancer and cancer therapy.     Zola Button, MD 8/25/20238:34 AM

## 2021-09-16 ENCOUNTER — Emergency Department (HOSPITAL_BASED_OUTPATIENT_CLINIC_OR_DEPARTMENT_OTHER): Payer: Medicare Other | Admitting: Radiology

## 2021-09-16 ENCOUNTER — Encounter (HOSPITAL_BASED_OUTPATIENT_CLINIC_OR_DEPARTMENT_OTHER): Payer: Self-pay

## 2021-09-16 ENCOUNTER — Other Ambulatory Visit: Payer: Self-pay

## 2021-09-16 ENCOUNTER — Emergency Department (HOSPITAL_BASED_OUTPATIENT_CLINIC_OR_DEPARTMENT_OTHER)
Admission: EM | Admit: 2021-09-16 | Discharge: 2021-09-16 | Disposition: A | Discharge status: Home / Self Care | Payer: Medicare Other | Attending: Emergency Medicine | Admitting: Emergency Medicine

## 2021-09-16 DIAGNOSIS — Z7901 Long term (current) use of anticoagulants: Secondary | ICD-10-CM | POA: Diagnosis not present

## 2021-09-16 DIAGNOSIS — Z20822 Contact with and (suspected) exposure to covid-19: Secondary | ICD-10-CM | POA: Diagnosis not present

## 2021-09-16 DIAGNOSIS — R079 Chest pain, unspecified: Secondary | ICD-10-CM | POA: Diagnosis not present

## 2021-09-16 DIAGNOSIS — Z87891 Personal history of nicotine dependence: Secondary | ICD-10-CM | POA: Insufficient documentation

## 2021-09-16 DIAGNOSIS — J45909 Unspecified asthma, uncomplicated: Secondary | ICD-10-CM | POA: Insufficient documentation

## 2021-09-16 DIAGNOSIS — S2231XA Fracture of one rib, right side, initial encounter for closed fracture: Secondary | ICD-10-CM | POA: Diagnosis not present

## 2021-09-16 DIAGNOSIS — R0602 Shortness of breath: Secondary | ICD-10-CM | POA: Diagnosis not present

## 2021-09-16 DIAGNOSIS — J449 Chronic obstructive pulmonary disease, unspecified: Secondary | ICD-10-CM | POA: Insufficient documentation

## 2021-09-16 DIAGNOSIS — Z85118 Personal history of other malignant neoplasm of bronchus and lung: Secondary | ICD-10-CM | POA: Diagnosis not present

## 2021-09-16 DIAGNOSIS — Z7951 Long term (current) use of inhaled steroids: Secondary | ICD-10-CM | POA: Insufficient documentation

## 2021-09-16 DIAGNOSIS — R059 Cough, unspecified: Secondary | ICD-10-CM | POA: Diagnosis not present

## 2021-09-16 LAB — BASIC METABOLIC PANEL
Anion gap: 11 (ref 5–15)
BUN: 26 mg/dL — ABNORMAL HIGH (ref 8–23)
CO2: 21 mmol/L — ABNORMAL LOW (ref 22–32)
Calcium: 9.7 mg/dL (ref 8.9–10.3)
Chloride: 104 mmol/L (ref 98–111)
Creatinine, Ser: 1.3 mg/dL — ABNORMAL HIGH (ref 0.61–1.24)
GFR, Estimated: 59 mL/min — ABNORMAL LOW (ref >60)
Glucose, Bld: 115 mg/dL — ABNORMAL HIGH (ref 70–99)
Potassium: 3.9 mmol/L (ref 3.5–5.1)
Sodium: 136 mmol/L (ref 135–145)

## 2021-09-16 LAB — CBC
HCT: 37.2 % — ABNORMAL LOW (ref 39.0–52.0)
Hemoglobin: 12.8 g/dL — ABNORMAL LOW (ref 13.0–17.0)
MCH: 29.2 pg (ref 26.0–34.0)
MCHC: 34.4 g/dL (ref 30.0–36.0)
MCV: 84.7 fL (ref 80.0–100.0)
Platelets: 210 10*3/uL (ref 150–400)
RBC: 4.39 MIL/uL (ref 4.22–5.81)
RDW: 13.1 % (ref 11.5–15.5)
WBC: 9.6 10*3/uL (ref 4.0–10.5)
nRBC: 0 % (ref 0.0–0.2)

## 2021-09-16 LAB — SARS CORONAVIRUS 2 BY RT PCR: SARS Coronavirus 2 by RT PCR: NEGATIVE

## 2021-09-16 LAB — TROPONIN I (HIGH SENSITIVITY): Troponin I (High Sensitivity): 3 ng/L (ref <18)

## 2021-09-16 NOTE — Discharge Instructions (Signed)
Your work-up did not show signs of pneumonia in the emergency department today.  Please follow-up with your oncologist or primary care doctor's office if you continue having symptoms.  Your COVID test was negative today.

## 2021-09-16 NOTE — ED Triage Notes (Signed)
He c/o congested cough since yesterday. He tells me he received "chemo for lung cancer" 2 days ago. He is mildly short of breath and in no distress.

## 2021-09-16 NOTE — ED Provider Notes (Signed)
[facility] EMERGENCY DEPT Provider Note   CSN: [id number] Arrival date & time: 09/16/21  1314     History  Chief Complaint  Patient presents with   Chest Pain   Cough    James Barker is a 71 y.o. male who is currently on chemotherapy for pulmonary cancer, history of COPD, presenting to ED with productive cough.  He reports his last round of chemotherapy was 2 days ago, or he had an infusion done on Friday, but he began developing a cough and some shortness of breath.  He has a history of PE and is compliant on Eliquis for that.  External records from oncologist Dr Alen Blew 09/14/21 office note for infusion, reporting :" He tolerated Pembrolizumab infusion with some mild fatigue and anorexia but resolves spontaneously complete."  Patient is on Eliquis for pulmonary embolism and will be continued on this indefinitely per his oncologist recommendation.  HPI     Home Medications Prior to Admission medications   Medication Sig Start Date End Date Taking? Authorizing Provider  albuterol (PROVENTIL) (2.5 MG/3ML) 0.083% nebulizer solution Take 3 mLs (2.5 mg total) by nebulization every 6 (six) hours as needed for wheezing or shortness of breath. 04/26/21   Cobb, Karie Schwalbe, NP  albuterol (VENTOLIN HFA) 108 (90 Base) MCG/ACT inhaler TAKE 2 PUFFS BY MOUTH EVERY 6 HOURS AS NEEDED FOR WHEEZE OR SHORTNESS OF BREATH 05/18/21   Spero Geralds, MD  amLODipine (NORVASC) 10 MG tablet Take 1 tablet (10 mg total) by mouth daily. 05/21/21   Mercy Riding, MD  apixaban (ELIQUIS) 5 MG TABS tablet Take 1 tablet (5 mg total) by mouth 2 (two) times daily. 08/24/21   Spero Geralds, MD  atorvastatin (LIPITOR) 20 MG tablet Take 20 mg by mouth daily. 12/07/20   [provider]  clotrimazole (MYCELEX) 10 MG troche Take 1 tablet (10 mg total) by mouth 5 (five) times daily. Patient not taking: Reported on 08/24/2021 06/15/21   Spero Geralds, MD  Dupilumab (DUPIXENT) 300 MG/2ML SOPN Inject  300 mg into the skin every 14 (fourteen) days. 06/07/21   Cobb, Karie Schwalbe, NP  fluticasone-salmeterol (ADVAIR HFA) 230-21 MCG/ACT inhaler Inhale 2 puffs into the lungs 2 (two) times daily. Patient taking differently: Inhale 2 puffs into the lungs 2 (two) times daily. 12/22/20   Spero Geralds, MD  guaiFENesin (MUCINEX) 600 MG 12 hr tablet Take 1 tablet (600 mg total) by mouth 2 (two) times daily. Patient not taking: Reported on 08/24/2021 04/13/21   Clayton Bibles, NP  Misc. Devices MISC Please dispense 1 flutter valve for use after inhalers Patient not taking: Reported on 08/24/2021 02/20/21   Spero Geralds, MD  prochlorperazine (COMPAZINE) 10 MG tablet Take 1 tablet (10 mg total) by mouth every 6 (six) hours as needed for nausea or vomiting. 08/24/21   Wyatt Portela, MD  sodium chloride 0.9 % nebulizer solution Take 3 mLs by nebulization 3 (three) times daily as needed for wheezing. 05/08/21   Spero Geralds, MD  Tiotropium Bromide Monohydrate (SPIRIVA RESPIMAT) 2.5 MCG/ACT AERS Inhale 2 puffs into the lungs daily. 04/13/21   Cobb, Karie Schwalbe, NP      Allergies    Cheese and Lactose intolerance (gi)    Review of Systems   Review of Systems  Physical Exam Updated Vital Signs BP 129/81   Pulse (!) 46   Temp 98.9 F (37.2 C)   Resp (!) 23   Ht '5\' 6"'$  (1.676  m)   Wt 77 kg   SpO2 100%   BMI 27.40 kg/m  Physical Exam Constitutional:      General: He is not in acute distress. HENT:     Head: Normocephalic and atraumatic.  Eyes:     Conjunctiva/sclera: Conjunctivae normal.     Pupils: Pupils are equal, round, and reactive to light.  Cardiovascular:     Rate and Rhythm: Normal rate and regular rhythm.  Pulmonary:     Effort: Pulmonary effort is normal. No respiratory distress.  Abdominal:     General: There is no distension.     Tenderness: There is no abdominal tenderness.  Skin:    General: Skin is warm and dry.  Neurological:     General: No focal deficit present.      Mental Status: He is alert. Mental status is at baseline.  Psychiatric:        Mood and Affect: Mood normal.        Behavior: Behavior normal.     ED Results / Procedures / Treatments   Labs (all labs ordered are listed, but only abnormal results are displayed) Labs Reviewed  BASIC METABOLIC PANEL - Abnormal; Notable for the following components:      Result Value   CO2 21 (*)    Glucose, Bld 115 (*)    BUN 26 (*)    Creatinine, Ser 1.30 (*)    GFR, Estimated 59 (*)    All other components within normal limits  CBC - Abnormal; Notable for the following components:   Hemoglobin 12.8 (*)    HCT 37.2 (*)    All other components within normal limits  SARS CORONAVIRUS 2 BY RT PCR  TROPONIN I (HIGH SENSITIVITY)  TROPONIN I (HIGH SENSITIVITY)    EKG EKG Interpretation  Date/Time:  Sunday September 16 2021 13:23:38 EDT Ventricular Rate:  63 PR Interval:  170 QRS Duration: 80 QT Interval:  396 QTC Calculation: 405 R Axis:   54 Text Interpretation: Normal sinus rhythm Normal ECG When compared with ECG of 15-May-2021 10:54, No significant change was found Confirmed by Octaviano Glow [phone]) on 09/16/2021 3:11:50 PM  Radiology DG Chest 2 View  Result Date: 09/16/2021 CLINICAL DATA:  Cough and chest pain.  Ex-smoker.  COPD.  Asthma. EXAM: CHEST - 2 VIEW COMPARISON:  05/18/2021 FINDINGS: Normal sized heart. Tortuous and partially calcified thoracic aorta. Stable linear scarring in the right mid to upper lung zone. Minimal linear scarring at the left lateral lung base. Otherwise, clear lungs with normal vascularity. Old, healed right rib fracture. Inferior vena cava filter. IMPRESSION: No active cardiopulmonary disease. Electronically Signed   By: Claudie Revering M.D.   On: 09/16/2021 14:32    Procedures Procedures    Medications Ordered in ED Medications - No data to display  ED Course/ Medical Decision Making/ A&P                           Medical Decision Making Amount and/or  Complexity of Data Reviewed Labs: ordered. Radiology: ordered.   This patient presents to the ED with concern for cough. This involves an extensive number of treatment options, and is a complaint that carries with it a high risk of complications and morbidity.  The differential diagnosis includes viral URI versus COPD versus bronchitis versus pneumonia versus other  Co-morbidities that complicate the patient evaluation: History of lung cancer and COPD at high risk for pulmonary complications  External  records from outside source obtained and reviewed including just office note  I ordered and personally interpreted labs.  The pertinent results include: Labs are unremarkable today.  No leukocytosis.  Troponin negative.  Patient is compliant with Eliquis and I have a very low suspicion for PE in this clinical setting.  I ordered imaging studies including x-ray of the chest I independently visualized and interpreted imaging which showed no focal infiltrate or pneumonia I agree with the radiologist interpretation  The patient was maintained on a cardiac monitor.  I personally viewed and interpreted the cardiac monitored which showed an underlying rhythm of: Sinus rhythm with sinus bradycardia   Per my interpretation the patient's ECG shows sinus rhythm no acute ischemic findings  Patient is not wheezing on exam.  He is not needing albuterol at this time.  He appears calm, comfortable, no excessive coughing.  Dispostion:  After consideration of the diagnostic results and the patients response to treatment, I feel that the patent would benefit from outpatient PCP or oncology follow-up if his symptoms persist.         Final Clinical Impression(s) / ED Diagnoses Final diagnoses:  Cough, unspecified type    Rx / DC Orders ED Discharge Orders     None         Wyvonnia Dusky, MD 09/16/21 1624

## 2021-09-17 ENCOUNTER — Telehealth: Payer: Self-pay | Admitting: Internal Medicine

## 2021-09-17 ENCOUNTER — Telehealth: Payer: Self-pay | Admitting: Adult Health

## 2021-09-17 MED ORDER — AZITHROMYCIN 250 MG PO TABS: ORAL_TABLET | ORAL | 0 refills | Status: AC

## 2021-09-17 NOTE — Telephone Encounter (Signed)
ATC x1.  LVM to return call. 

## 2021-09-17 NOTE — Telephone Encounter (Signed)
Hx of bladder cancer on chemo. Hx of Asthma prone to exacerbations  Calls with cough, congestion , thick green mucus x 5 days . Martin Majestic to ER yesterday , Covid test is negative .  CXR-NAD  Requests ABX .   Zpack #1 sent to pharmacy .  Mucinex DM Twice daily  Continue on Asthma regimen .  Advised to follow up in office if not improving  Please contact office for sooner follow up if symptoms do not improve or worsen or seek emergency care

## 2021-09-18 ENCOUNTER — Other Ambulatory Visit (HOSPITAL_COMMUNITY): Payer: Self-pay

## 2021-09-18 NOTE — Telephone Encounter (Signed)
Patient spoke with Tammy, NP 09/17/21 and medications were sent to pharmacy. I did call patient and he stated he is taking meds as recommended by Lynelle Smoke, NP.  Patient stated he is feeling better today.  Advised patient to call if needed.

## 2021-09-22 ENCOUNTER — Other Ambulatory Visit: Payer: Self-pay

## 2021-09-25 ENCOUNTER — Other Ambulatory Visit (HOSPITAL_COMMUNITY): Payer: Self-pay

## 2021-09-27 ENCOUNTER — Other Ambulatory Visit (HOSPITAL_COMMUNITY): Payer: Self-pay

## 2021-10-05 ENCOUNTER — Inpatient Hospital Stay: Payer: Medicare Other | Attending: Oncology

## 2021-10-05 ENCOUNTER — Inpatient Hospital Stay: Payer: Medicare Other

## 2021-10-05 ENCOUNTER — Inpatient Hospital Stay (HOSPITAL_BASED_OUTPATIENT_CLINIC_OR_DEPARTMENT_OTHER): Payer: Medicare Other | Admitting: Oncology

## 2021-10-05 VITALS — BP 134/79 | HR 55 | Temp 97.2°F | Resp 18 | Ht 66.0 in | Wt 171.8 lb

## 2021-10-05 DIAGNOSIS — Z8551 Personal history of malignant neoplasm of bladder: Secondary | ICD-10-CM | POA: Diagnosis not present

## 2021-10-05 DIAGNOSIS — C679 Malignant neoplasm of bladder, unspecified: Secondary | ICD-10-CM | POA: Diagnosis not present

## 2021-10-05 DIAGNOSIS — R3912 Poor urinary stream: Secondary | ICD-10-CM | POA: Diagnosis not present

## 2021-10-05 DIAGNOSIS — I2699 Other pulmonary embolism without acute cor pulmonale: Secondary | ICD-10-CM | POA: Diagnosis not present

## 2021-10-05 DIAGNOSIS — Z5112 Encounter for antineoplastic immunotherapy: Secondary | ICD-10-CM | POA: Diagnosis not present

## 2021-10-05 DIAGNOSIS — J029 Acute pharyngitis, unspecified: Secondary | ICD-10-CM | POA: Insufficient documentation

## 2021-10-05 DIAGNOSIS — Z79899 Other long term (current) drug therapy: Secondary | ICD-10-CM | POA: Diagnosis not present

## 2021-10-05 DIAGNOSIS — Z7901 Long term (current) use of anticoagulants: Secondary | ICD-10-CM | POA: Insufficient documentation

## 2021-10-05 LAB — CBC WITH DIFFERENTIAL (CANCER CENTER ONLY)
Abs Immature Granulocytes: 0.01 10*3/uL (ref 0.00–0.07)
Basophils Absolute: 0.1 10*3/uL (ref 0.0–0.1)
Basophils Relative: 1 %
Eosinophils Absolute: 0.2 10*3/uL (ref 0.0–0.5)
Eosinophils Relative: 2 %
HCT: 37.7 % — ABNORMAL LOW (ref 39.0–52.0)
Hemoglobin: 12.8 g/dL — ABNORMAL LOW (ref 13.0–17.0)
Immature Granulocytes: 0 %
Lymphocytes Relative: 20 %
Lymphs Abs: 1.6 10*3/uL (ref 0.7–4.0)
MCH: 29 pg (ref 26.0–34.0)
MCHC: 34 g/dL (ref 30.0–36.0)
MCV: 85.5 fL (ref 80.0–100.0)
Monocytes Absolute: 0.7 10*3/uL (ref 0.1–1.0)
Monocytes Relative: 9 %
Neutro Abs: 5.4 10*3/uL (ref 1.7–7.7)
Neutrophils Relative %: 68 %
Platelet Count: 209 10*3/uL (ref 150–400)
RBC: 4.41 MIL/uL (ref 4.22–5.81)
RDW: 13.2 % (ref 11.5–15.5)
WBC Count: 7.9 10*3/uL (ref 4.0–10.5)
nRBC: 0 % (ref 0.0–0.2)

## 2021-10-05 LAB — TSH: TSH: 1.708 u[IU]/mL (ref 0.350–4.500)

## 2021-10-05 LAB — CMP (CANCER CENTER ONLY)
ALT: 16 U/L (ref 0–44)
AST: 21 U/L (ref 15–41)
Albumin: 3.9 g/dL (ref 3.5–5.0)
Alkaline Phosphatase: 54 U/L (ref 38–126)
Anion gap: 6 (ref 5–15)
BUN: 29 mg/dL — ABNORMAL HIGH (ref 8–23)
CO2: 23 mmol/L (ref 22–32)
Calcium: 9.5 mg/dL (ref 8.9–10.3)
Chloride: 111 mmol/L (ref 98–111)
Creatinine: 1.21 mg/dL (ref 0.61–1.24)
GFR, Estimated: >60 mL/min (ref >60)
Glucose, Bld: 95 mg/dL (ref 70–99)
Potassium: 3.9 mmol/L (ref 3.5–5.1)
Sodium: 140 mmol/L (ref 135–145)
Total Bilirubin: 0.6 mg/dL (ref 0.3–1.2)
Total Protein: 7.1 g/dL (ref 6.5–8.1)

## 2021-10-05 MED ORDER — SODIUM CHLORIDE 0.9 % IV SOLN: Freq: Once | INTRAVENOUS | Status: AC

## 2021-10-05 MED ORDER — SODIUM CHLORIDE 0.9 % IV SOLN
200.0000 mg | Freq: Once | INTRAVENOUS | Status: AC
  Administered 2021-10-05: 200 mg via INTRAVENOUS
  Filled 2021-10-05: qty 200

## 2021-10-05 NOTE — Progress Notes (Signed)
Hematology and Oncology Follow Up Visit  James Barker [id number] Mar 03, 1950 71 y.o. 10/05/2021 9:50 AM James Barker James Barker, [name], James Barker, [name]   Principle Diagnosis: 71 year old with bladder cancer diagnosed in 2020.  He developed BCG refractory disease documented in 2023.   Prior Therapy:   He is status post BCG induction which he did not tolerate at that time.  He received 6 treatments with Mitomycin-C instead.  This was given in [place].    He establish care locally after traveling from [place] and [place] and was seen by James Barker.  He underwent cystoscopy and transurethral resection of the prostate completed on May 11, 2021.  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  Current therapy: Pembrolizumab 200 mg every 3 weeks started on Jun 20, 2021.  He returns for cycle 6 of therapy.  Interim History: James Barker presents today for a follow-up visit.  Since the last visit, she was seen in the emergency department for presumed asthma COPD exacerbation and was treated with a Z-Pak.  Since that time, he reports improvement in his overall health and symptoms.  He does report occasional sore throat but no fevers or chills.  His cough has improved significantly.     Medications: Updated on review. Current Outpatient Medications  Medication Sig Dispense Refill   albuterol (PROVENTIL) (2.5 MG/3ML) 0.083% nebulizer solution Take 3 mLs (2.5 mg total) by nebulization every 6 (six) hours as needed for wheezing or shortness of breath. 225 mL 3   albuterol (VENTOLIN HFA) 108 (90 Base) MCG/ACT inhaler TAKE 2 PUFFS BY MOUTH EVERY 6 HOURS AS NEEDED FOR WHEEZE OR SHORTNESS OF BREATH 6.7 each 5   amLODipine (NORVASC) 10 MG tablet Take 1 tablet (10 mg total) by mouth daily. 30 tablet 1   apixaban (ELIQUIS) 5 MG TABS tablet Take 1 tablet (5 mg total) by mouth 2 (two) times daily. 60 tablet 2   atorvastatin (LIPITOR) 20 MG tablet Take 20 mg by mouth daily.     clotrimazole (MYCELEX) 10 MG  troche Take 1 tablet (10 mg total) by mouth 5 (five) times daily. (Patient not taking: Reported on 08/24/2021) 35 Troche 0   Dupilumab (DUPIXENT) 300 MG/2ML SOPN Inject 300 mg into the skin every 14 days. 12 mL 1   fluticasone-salmeterol (ADVAIR HFA) 230-21 MCG/ACT inhaler Inhale 2 puffs into the lungs 2 (two) times daily. (Patient taking differently: Inhale 2 puffs into the lungs 2 (two) times daily.) 1 each 12   guaiFENesin (MUCINEX) 600 MG 12 hr tablet Take 1 tablet (600 mg total) by mouth 2 (two) times daily. (Patient not taking: Reported on 08/24/2021) 60 tablet 1   Misc. Devices MISC Please dispense 1 flutter valve for use after inhalers (Patient not taking: Reported on 08/24/2021) 1 each 0   prochlorperazine (COMPAZINE) 10 MG tablet Take 1 tablet (10 mg total) by mouth every 6 (six) hours as needed for nausea or vomiting. 30 tablet 3   sodium chloride 0.9 % nebulizer solution Take 3 mLs by nebulization 3 (three) times daily as needed for wheezing. 90 mL 12   Tiotropium Bromide Monohydrate (SPIRIVA RESPIMAT) 2.5 MCG/ACT AERS Inhale 2 puffs into the lungs daily. 4 g 5   No current facility-administered medications for this visit.     Allergies:  Allergies  Allergen Reactions   Cheese Nausea And Vomiting   Lactose Intolerance (Gi) Diarrhea and Nausea And Vomiting      Physical Exam:  Blood pressure 134/79, pulse (!) 55, temperature (!)  97.2 F (36.2 C), temperature source Temporal, resp. rate 18, height '5\' 6"'$  (1.676 m), weight 171 lb 12.8 oz (77.9 kg), SpO2 96 %.     ECOG: 1    General appearance: Alert, awake without any distress. Head: Atraumatic without abnormalities Oropharynx: Without any thrush or ulcers. Eyes: No scleral icterus. Lymph nodes: No lymphadenopathy noted in the cervical, supraclavicular, or axillary nodes Heart:regular rate and rhythm, without any murmurs or gallops.   Lung: Clear to auscultation without any rhonchi, wheezes or dullness to  percussion. Abdomin: Soft, nontender without any shifting dullness or ascites. Musculoskeletal: No clubbing or cyanosis. Neurological: No motor or sensory deficits. Skin: No rashes or lesions.      Lab Results: Lab Results  Component Value Date   WBC 9.6 09/16/2021   HGB 12.8 (L) 09/16/2021   HCT 37.2 (L) 09/16/2021   MCV 84.7 09/16/2021   PLT 210 09/16/2021     Chemistry      Component Value Date/Time   NA 136 09/16/2021 1326   K 3.9 09/16/2021 1326   CL 104 09/16/2021 1326   CO2 21 (L) 09/16/2021 1326   BUN 26 (H) 09/16/2021 1326   CREATININE 1.30 (H) 09/16/2021 1326   CREATININE 1.19 09/14/2021 0825      Component Value Date/Time   CALCIUM 9.7 09/16/2021 1326   ALKPHOS 52 09/14/2021 0825   AST 17 09/14/2021 0825   ALT 10 09/14/2021 0825   BILITOT 0.6 09/14/2021 0825       Impression and Plan:   71 year old with:  1.  Bladder cancer diagnosed in 2020.  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   Risks and benefits of continuing Pembrolizumab were discussed at this time.  Potential complications that include nausea, fatigue and autoimmune considerations were reiterated.  Salvage therapy options including cystectomy were also discussed.  He will proceed today with Pembrolizumab today and in 3 weeks.  His cystoscopy is scheduled for today and pending these results treatment will continue Pembrolizumab more recommend cystectomy at that time pending his results.   2.  IV access: No issues with peripheral veins noted at this time Port-A-Cath remains in place.  3.  Antiemetics: No nausea or vomiting reported at this time.  Compazine is available to him.   4.  Autoimmune complications: These complications including pneumonitis, colitis, hepatitis and hypophysitis.  He has not experienced any at this time.   5.  Pulmonary embolism: He is currently on Eliquis which will be continued indefinitely.  6.  Follow-up: In 3 weeks for the next cycle of  therapy.   30  minutes were spent on this encounter.  Time was dedicated to updating his disease status, treatment choices and outlining future plan of care discussion.     Zola Button, MD 9/15/20239:50 AM

## 2021-10-06 LAB — T4: T4, Total: 7.7 ug/dL (ref 4.5–12.0)

## 2021-10-08 ENCOUNTER — Telehealth: Payer: Self-pay

## 2021-10-08 NOTE — Telephone Encounter (Signed)
Per [facility] pt. PA is expiring 10-14-21 for  DUPIXENT (dupilumab) '300MG'$ /2ML pre-filled pens   Received notification from [facility] regarding a prior authorization for [place]. Authorization has been APPROVED from 10-08-2021 to 01-20-2022.   Key: [id number] PA Case ID: [id number]  Approval letter sent to scan center.

## 2021-10-08 NOTE — Telephone Encounter (Signed)
Encounter opened in error. Please disregard.

## 2021-10-23 ENCOUNTER — Other Ambulatory Visit (HOSPITAL_COMMUNITY): Payer: Self-pay

## 2021-10-26 ENCOUNTER — Inpatient Hospital Stay: Payer: Medicare Other

## 2021-10-26 ENCOUNTER — Inpatient Hospital Stay (HOSPITAL_BASED_OUTPATIENT_CLINIC_OR_DEPARTMENT_OTHER): Payer: Medicare Other | Admitting: Oncology

## 2021-10-26 ENCOUNTER — Inpatient Hospital Stay: Payer: Medicare Other | Attending: Oncology

## 2021-10-26 ENCOUNTER — Other Ambulatory Visit: Payer: Self-pay

## 2021-10-26 VITALS — BP 124/82 | HR 48 | Temp 97.7°F | Wt 173.3 lb

## 2021-10-26 VITALS — BP 132/72 | HR 49 | Temp 98.2°F | Resp 18

## 2021-10-26 DIAGNOSIS — C679 Malignant neoplasm of bladder, unspecified: Secondary | ICD-10-CM | POA: Diagnosis not present

## 2021-10-26 DIAGNOSIS — Z7951 Long term (current) use of inhaled steroids: Secondary | ICD-10-CM | POA: Diagnosis not present

## 2021-10-26 DIAGNOSIS — Z7901 Long term (current) use of anticoagulants: Secondary | ICD-10-CM | POA: Diagnosis not present

## 2021-10-26 DIAGNOSIS — Z79899 Other long term (current) drug therapy: Secondary | ICD-10-CM | POA: Diagnosis not present

## 2021-10-26 DIAGNOSIS — Z5112 Encounter for antineoplastic immunotherapy: Secondary | ICD-10-CM | POA: Insufficient documentation

## 2021-10-26 LAB — CBC WITH DIFFERENTIAL (CANCER CENTER ONLY)
Abs Immature Granulocytes: 0.02 10*3/uL (ref 0.00–0.07)
Basophils Absolute: 0.1 10*3/uL (ref 0.0–0.1)
Basophils Relative: 1 %
Eosinophils Absolute: 0.3 10*3/uL (ref 0.0–0.5)
Eosinophils Relative: 4 %
HCT: 35.2 % — ABNORMAL LOW (ref 39.0–52.0)
Hemoglobin: 11.9 g/dL — ABNORMAL LOW (ref 13.0–17.0)
Immature Granulocytes: 0 %
Lymphocytes Relative: 27 %
Lymphs Abs: 1.9 10*3/uL (ref 0.7–4.0)
MCH: 28.9 pg (ref 26.0–34.0)
MCHC: 33.8 g/dL (ref 30.0–36.0)
MCV: 85.4 fL (ref 80.0–100.0)
Monocytes Absolute: 0.8 10*3/uL (ref 0.1–1.0)
Monocytes Relative: 11 %
Neutro Abs: 4.2 10*3/uL (ref 1.7–7.7)
Neutrophils Relative %: 57 %
Platelet Count: 197 10*3/uL (ref 150–400)
RBC: 4.12 MIL/uL — ABNORMAL LOW (ref 4.22–5.81)
RDW: 13.4 % (ref 11.5–15.5)
WBC Count: 7.2 10*3/uL (ref 4.0–10.5)
nRBC: 0 % (ref 0.0–0.2)

## 2021-10-26 LAB — CMP (CANCER CENTER ONLY)
ALT: 9 U/L (ref 0–44)
AST: 14 U/L — ABNORMAL LOW (ref 15–41)
Albumin: 3.7 g/dL (ref 3.5–5.0)
Alkaline Phosphatase: 54 U/L (ref 38–126)
Anion gap: 4 — ABNORMAL LOW (ref 5–15)
BUN: 20 mg/dL (ref 8–23)
CO2: 25 mmol/L (ref 22–32)
Calcium: 8.8 mg/dL — ABNORMAL LOW (ref 8.9–10.3)
Chloride: 111 mmol/L (ref 98–111)
Creatinine: 1.29 mg/dL — ABNORMAL HIGH (ref 0.61–1.24)
GFR, Estimated: 59 mL/min — ABNORMAL LOW (ref >60)
Glucose, Bld: 115 mg/dL — ABNORMAL HIGH (ref 70–99)
Potassium: 4 mmol/L (ref 3.5–5.1)
Sodium: 140 mmol/L (ref 135–145)
Total Bilirubin: 0.3 mg/dL (ref 0.3–1.2)
Total Protein: 6.6 g/dL (ref 6.5–8.1)

## 2021-10-26 MED ORDER — SODIUM CHLORIDE 0.9 % IV SOLN: Freq: Once | INTRAVENOUS | Status: DC

## 2021-10-26 MED ORDER — SODIUM CHLORIDE 0.9 % IV SOLN
200.0000 mg | Freq: Once | INTRAVENOUS | Status: AC
  Administered 2021-10-26: 200 mg via INTRAVENOUS
  Filled 2021-10-26: qty 200

## 2021-10-26 NOTE — Patient Instructions (Signed)
[facility]   Discharge Instructions: Thank you for choosing [facility] to provide your oncology and hematology care.   If you have a lab appointment with the Cancer Center, please go directly to the Cancer Center and check in at the registration area.   Wear comfortable clothing and clothing appropriate for easy access to any Portacath or PICC line.   We strive to give you quality time with your provider. You may need to reschedule your appointment if you arrive late (15 or more minutes).  Arriving late affects you and other patients whose appointments are after yours.  Also, if you miss three or more appointments without notifying the office, you may be dismissed from the clinic at the provider's discretion.      For prescription refill requests, have your pharmacy contact our office and allow 72 hours for refills to be completed.    Today you received the following chemotherapy and/or immunotherapy agents: Pembrolizumab (Keytruda)      To help prevent nausea and vomiting after your treatment, we encourage you to take your nausea medication as directed.  BELOW ARE SYMPTOMS THAT SHOULD BE REPORTED IMMEDIATELY: *FEVER GREATER THAN 100.4 F (38 C) OR HIGHER *CHILLS OR SWEATING *NAUSEA AND VOMITING THAT IS NOT CONTROLLED WITH YOUR NAUSEA MEDICATION *UNUSUAL SHORTNESS OF BREATH *UNUSUAL BRUISING OR BLEEDING *URINARY PROBLEMS (pain or burning when urinating, or frequent urination) *BOWEL PROBLEMS (unusual diarrhea, constipation, pain near the anus) TENDERNESS IN MOUTH AND THROAT WITH OR WITHOUT PRESENCE OF ULCERS (sore throat, sores in mouth, or a toothache) UNUSUAL RASH, SWELLING OR PAIN  UNUSUAL VAGINAL DISCHARGE OR ITCHING   Items with * indicate a potential emergency and should be followed up as soon as possible or go to the Emergency Department if any problems should occur.  Please show the CHEMOTHERAPY ALERT CARD or IMMUNOTHERAPY ALERT  CARD at check-in to the Emergency Department and triage nurse.  Should you have questions after your visit or need to cancel or reschedule your appointment, please contact [facility]  Dept: [phone]  and follow the prompts.  Office hours are 8:00 a.m. to 4:30 p.m. Monday - Friday. Please note that voicemails left after 4:00 p.m. may not be returned until the following business day.  We are closed weekends and major holidays. You have access to a nurse at all times for urgent questions. Please call the main number to the clinic Dept: [phone] and follow the prompts.   For any non-urgent questions, you may also contact your provider using MyChart. We now offer e-Visits for anyone 18 and older to request care online for non-urgent symptoms. For details visit [email or web address].   Also download the MyChart app! Go to the app store, search "MyChart", open the app, select [facility], and log in with your MyChart username and password.  Masks are optional in the cancer centers. If you would like for your care team to wear a mask while they are taking care of you, please let them know. You may have one support person who is at least 71 years old accompany you for your appointments. 

## 2021-10-26 NOTE — Progress Notes (Signed)
Hematology and Oncology Follow Up Visit  James Barker [id number] [birth year] 71 y.o. 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   Prior Therapy:   He is status post BCG induction which he did not tolerate at that time.  He received 6 treatments with Mitomycin-C instead.  This was given in [place].    He establish care locally after traveling from [place] and [place] and was seen by Dr. Junious Silk.  He underwent cystoscopy and transurethral resection of the prostate completed on May 11, 2021.  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  Current therapy: Pembrolizumab 200 mg every 3 weeks started on Jun 20, 2021.  He returns for cycle 8 of therapy.  Interim History: James Barker returns today for repeat follow-up.  Since the last visit, he completed a cystoscopy under the care of Dr. Junious Silk and showed no evidence of residual disease.  He denies any complications related to Pembrolizumab.  He denies excessive fatigue, tiredness or weakness.  He denies any hospitalizations or illnesses.    Medications: Updated on review. Current Outpatient Medications  Medication Sig Dispense Refill   albuterol (PROVENTIL) (2.5 MG/3ML) 0.083% nebulizer solution Take 3 mLs (2.5 mg total) by nebulization every 6 (six) hours as needed for wheezing or shortness of breath. 225 mL 3   albuterol (VENTOLIN HFA) 108 (90 Base) MCG/ACT inhaler TAKE 2 PUFFS BY MOUTH EVERY 6 HOURS AS NEEDED FOR WHEEZE OR SHORTNESS OF BREATH 6.7 each 5   amLODipine (NORVASC) 10 MG tablet Take 1 tablet (10 mg total) by mouth daily. 30 tablet 1   apixaban (ELIQUIS) 5 MG TABS tablet Take 1 tablet (5 mg total) by mouth 2 (two) times daily. 60 tablet 2   atorvastatin (LIPITOR) 20 MG tablet Take 20 mg by mouth daily.     clotrimazole (MYCELEX) 10 MG troche Take 1 tablet (10 mg total)  by mouth 5 (five) times daily. (Patient not taking: Reported on 08/24/2021) 35 Troche 0   Dupilumab (DUPIXENT) 300 MG/2ML SOPN Inject 300 mg into the skin every 14 days. 12 mL 1   fluticasone-salmeterol (ADVAIR HFA) 230-21 MCG/ACT inhaler Inhale 2 puffs into the lungs 2 (two) times daily. (Patient taking differently: Inhale 2 puffs into the lungs 2 (two) times daily.) 1 each 12   guaiFENesin (MUCINEX) 600 MG 12 hr tablet Take 1 tablet (600 mg total) by mouth 2 (two) times daily. (Patient not taking: Reported on 08/24/2021) 60 tablet 1   Misc. Devices MISC Please dispense 1 flutter valve for use after inhalers (Patient not taking: Reported on 08/24/2021) 1 each 0   prochlorperazine (COMPAZINE) 10 MG tablet Take 1 tablet (10 mg total) by mouth every 6 (six) hours as needed for nausea or vomiting. 30 tablet 3   sodium chloride 0.9 % nebulizer solution Take 3 mLs by nebulization 3 (three) times daily as needed for wheezing. 90 mL 12   Tiotropium Bromide Monohydrate (SPIRIVA RESPIMAT) 2.5 MCG/ACT AERS Inhale 2 puffs into the lungs daily. 4 g 5   No current facility-administered medications for this visit.     Allergies:  Allergies  Allergen Reactions   Cheese Nausea And Vomiting   Lactose Intolerance (Gi) Diarrhea and Nausea And Vomiting      Physical Exam:  Blood pressure 124/82, pulse (!) 48, temperature 97.7 F (36.5 C), temperature source Temporal, weight 173 lb  4.8 oz (78.6 kg), SpO2 98 %.      ECOG: 1   General appearance: Comfortable appearing without any discomfort Head: Normocephalic without any trauma Oropharynx: Mucous membranes are moist and pink without any thrush or ulcers. Eyes: Pupils are equal and round reactive to light. Lymph nodes: No cervical, supraclavicular, inguinal or axillary lymphadenopathy.   Heart:regular rate and rhythm.  S1 and S2 without leg edema. Lung: Clear without any rhonchi or wheezes.  No dullness to percussion. Abdomin: Soft, nontender,  nondistended with good bowel sounds.  No hepatosplenomegaly. Musculoskeletal: No joint deformity or effusion.  Full range of motion noted. Neurological: No deficits noted on motor, sensory and deep tendon reflex exam. Skin: No petechial rash or dryness.  Appeared moist.      Lab Results: Lab Results  Component Value Date   WBC 7.9 10/05/2021   HGB 12.8 (L) 10/05/2021   HCT 37.7 (L) 10/05/2021   MCV 85.5 10/05/2021   PLT 209 10/05/2021     Chemistry      Component Value Date/Time   NA 140 10/05/2021 0955   K 3.9 10/05/2021 0955   CL 111 10/05/2021 0955   CO2 23 10/05/2021 0955   BUN 29 (H) 10/05/2021 0955   CREATININE 1.21 10/05/2021 0955      Component Value Date/Time   CALCIUM 9.5 10/05/2021 0955   ALKPHOS 54 10/05/2021 0955   AST 21 10/05/2021 0955   ALT 16 10/05/2021 0955   BILITOT 0.6 10/05/2021 0955       Impression and Plan:   71 year old with:  1.  BCG refractory bladder cancer diagnosed in 2023.  He was found to have carcinoma in situ.     He has been receiving Pembrolizumab without any major complications.  Risks and benefits of continuing this treatment today were discussed.  He has completed cystoscopy in September 2023 without any evidence of relapsed disease.  After discussion today, I recommended discontinuation of Pembrolizumab and proceed with active surveillance only.  He will have repeat cystoscopy in the next 3 to 4 months and salvage therapy could be initiated if he has relapsed disease.   2.  IV access: Peripheral veins continue to be in use without any issues.     3.  Autoimmune complications: He has not reported any complications at this time.  These include pneumonitis, colitis and thyroid disease.   4.  Follow-up: We will be as needed if he developed relapsed disease.  Will rely on Dr. Lyndal Rainbow referral back if needed.   30  minutes were dedicated to this visit.  The time was spent on updating his disease status, treatment choices  and outlining future plan of care review.     Zola Button, MD 10/6/202311:41 AM

## 2021-10-28 ENCOUNTER — Other Ambulatory Visit: Payer: Self-pay

## 2021-10-29 ENCOUNTER — Other Ambulatory Visit (HOSPITAL_COMMUNITY): Payer: Self-pay

## 2021-10-31 ENCOUNTER — Other Ambulatory Visit (HOSPITAL_COMMUNITY): Payer: Self-pay

## 2021-11-01 ENCOUNTER — Other Ambulatory Visit (HOSPITAL_COMMUNITY): Payer: Self-pay

## 2021-11-02 ENCOUNTER — Other Ambulatory Visit: Payer: Self-pay | Admitting: Internal Medicine

## 2021-11-27 ENCOUNTER — Other Ambulatory Visit: Payer: Self-pay | Admitting: Nurse Practitioner

## 2021-11-27 ENCOUNTER — Other Ambulatory Visit (HOSPITAL_COMMUNITY): Payer: Self-pay

## 2021-11-27 DIAGNOSIS — J4489 Other specified chronic obstructive pulmonary disease: Secondary | ICD-10-CM

## 2021-11-27 DIAGNOSIS — D721 Eosinophilia, unspecified: Secondary | ICD-10-CM

## 2021-11-28 ENCOUNTER — Other Ambulatory Visit (HOSPITAL_COMMUNITY): Payer: Self-pay

## 2021-11-28 DIAGNOSIS — R3912 Poor urinary stream: Secondary | ICD-10-CM | POA: Diagnosis not present

## 2021-11-28 DIAGNOSIS — R310 Gross hematuria: Secondary | ICD-10-CM | POA: Diagnosis not present

## 2021-11-28 MED ORDER — DUPIXENT 300 MG/2ML ~~LOC~~ SOAJ
300.0000 mg | SUBCUTANEOUS | 3 refills | Status: DC
  Filled 2021-11-28: qty 4, 28d supply, fill #0
  Filled 2021-12-27: qty 4, 28d supply, fill #1
  Filled 2022-01-25: qty 4, 28d supply, fill #2
  Filled 2022-02-21: qty 4, 28d supply, fill #3
  Filled 2022-03-21: qty 4, 28d supply, fill #4
  Filled 2022-04-30: qty 4, 28d supply, fill #5
  Filled 2022-05-27: qty 4, 28d supply, fill #6
  Filled 2022-06-28: qty 4, 28d supply, fill #7
  Filled 2022-07-23: qty 4, 28d supply, fill #8
  Filled 2022-08-14: qty 4, 28d supply, fill #9
  Filled 2022-09-19: qty 4, 28d supply, fill #10
  Filled 2022-10-17: qty 4, 28d supply, fill #11

## 2021-11-28 NOTE — Telephone Encounter (Signed)
Please forward to Dr. Shearon Stalls. I haven't seen him since March.

## 2021-12-01 ENCOUNTER — Other Ambulatory Visit: Payer: Self-pay | Admitting: Internal Medicine

## 2021-12-01 DIAGNOSIS — E7800 Pure hypercholesterolemia, unspecified: Secondary | ICD-10-CM

## 2021-12-03 NOTE — Telephone Encounter (Signed)
Pt needs to contact pcp for refill

## 2021-12-06 ENCOUNTER — Other Ambulatory Visit (HOSPITAL_COMMUNITY): Payer: Self-pay

## 2021-12-18 DIAGNOSIS — N1831 Chronic kidney disease, stage 3a: Secondary | ICD-10-CM | POA: Diagnosis not present

## 2021-12-18 DIAGNOSIS — D692 Other nonthrombocytopenic purpura: Secondary | ICD-10-CM | POA: Diagnosis not present

## 2021-12-18 DIAGNOSIS — J418 Mixed simple and mucopurulent chronic bronchitis: Secondary | ICD-10-CM | POA: Diagnosis not present

## 2021-12-18 DIAGNOSIS — E7800 Pure hypercholesterolemia, unspecified: Secondary | ICD-10-CM | POA: Diagnosis not present

## 2021-12-18 DIAGNOSIS — Z86711 Personal history of pulmonary embolism: Secondary | ICD-10-CM | POA: Diagnosis not present

## 2021-12-18 DIAGNOSIS — Z0000 Encounter for general adult medical examination without abnormal findings: Secondary | ICD-10-CM | POA: Diagnosis not present

## 2021-12-18 DIAGNOSIS — C679 Malignant neoplasm of bladder, unspecified: Secondary | ICD-10-CM | POA: Diagnosis not present

## 2021-12-18 DIAGNOSIS — I10 Essential (primary) hypertension: Secondary | ICD-10-CM | POA: Diagnosis not present

## 2021-12-18 DIAGNOSIS — Z23 Encounter for immunization: Secondary | ICD-10-CM | POA: Diagnosis not present

## 2021-12-19 ENCOUNTER — Emergency Department (HOSPITAL_COMMUNITY): Payer: Medicare Other

## 2021-12-19 ENCOUNTER — Encounter (HOSPITAL_COMMUNITY): Payer: Self-pay

## 2021-12-19 ENCOUNTER — Emergency Department (HOSPITAL_COMMUNITY)
Admission: EM | Admit: 2021-12-19 | Discharge: 2021-12-19 | Disposition: A | Discharge status: Home / Self Care | Payer: Medicare Other | Attending: Emergency Medicine | Admitting: Emergency Medicine

## 2021-12-19 DIAGNOSIS — J45909 Unspecified asthma, uncomplicated: Secondary | ICD-10-CM | POA: Diagnosis not present

## 2021-12-19 DIAGNOSIS — R509 Fever, unspecified: Secondary | ICD-10-CM | POA: Diagnosis not present

## 2021-12-19 DIAGNOSIS — R5083 Postvaccination fever: Secondary | ICD-10-CM | POA: Insufficient documentation

## 2021-12-19 DIAGNOSIS — Z1152 Encounter for screening for COVID-19: Secondary | ICD-10-CM | POA: Insufficient documentation

## 2021-12-19 DIAGNOSIS — Z79899 Other long term (current) drug therapy: Secondary | ICD-10-CM | POA: Diagnosis not present

## 2021-12-19 DIAGNOSIS — J449 Chronic obstructive pulmonary disease, unspecified: Secondary | ICD-10-CM | POA: Diagnosis not present

## 2021-12-19 DIAGNOSIS — J9811 Atelectasis: Secondary | ICD-10-CM | POA: Diagnosis not present

## 2021-12-19 DIAGNOSIS — R0689 Other abnormalities of breathing: Secondary | ICD-10-CM | POA: Diagnosis not present

## 2021-12-19 DIAGNOSIS — J45901 Unspecified asthma with (acute) exacerbation: Secondary | ICD-10-CM | POA: Diagnosis not present

## 2021-12-19 DIAGNOSIS — Z743 Need for continuous supervision: Secondary | ICD-10-CM | POA: Diagnosis not present

## 2021-12-19 DIAGNOSIS — R6889 Other general symptoms and signs: Secondary | ICD-10-CM | POA: Diagnosis not present

## 2021-12-19 DIAGNOSIS — T699XXA Effect of reduced temperature, unspecified, initial encounter: Secondary | ICD-10-CM | POA: Diagnosis not present

## 2021-12-19 DIAGNOSIS — R0602 Shortness of breath: Secondary | ICD-10-CM | POA: Diagnosis not present

## 2021-12-19 LAB — CBC WITH DIFFERENTIAL/PLATELET
Abs Immature Granulocytes: 0.05 10*3/uL (ref 0.00–0.07)
Basophils Absolute: 0 10*3/uL (ref 0.0–0.1)
Basophils Relative: 0 %
Eosinophils Absolute: 0.1 10*3/uL (ref 0.0–0.5)
Eosinophils Relative: 1 %
HCT: 36.6 % — ABNORMAL LOW (ref 39.0–52.0)
Hemoglobin: 12.4 g/dL — ABNORMAL LOW (ref 13.0–17.0)
Immature Granulocytes: 1 %
Lymphocytes Relative: 3 %
Lymphs Abs: 0.2 10*3/uL — ABNORMAL LOW (ref 0.7–4.0)
MCH: 29.4 pg (ref 26.0–34.0)
MCHC: 33.9 g/dL (ref 30.0–36.0)
MCV: 86.7 fL (ref 80.0–100.0)
Monocytes Absolute: 0.6 10*3/uL (ref 0.1–1.0)
Monocytes Relative: 7 %
Neutro Abs: 8.5 10*3/uL — ABNORMAL HIGH (ref 1.7–7.7)
Neutrophils Relative %: 88 %
Platelets: 171 10*3/uL (ref 150–400)
RBC: 4.22 MIL/uL (ref 4.22–5.81)
RDW: 13.4 % (ref 11.5–15.5)
WBC: 9.4 10*3/uL (ref 4.0–10.5)
nRBC: 0 % (ref 0.0–0.2)

## 2021-12-19 LAB — RESP PANEL BY RT-PCR (FLU A&B, COVID) ARPGX2
Influenza A by PCR: NEGATIVE
Influenza B by PCR: NEGATIVE
SARS Coronavirus 2 by RT PCR: NEGATIVE

## 2021-12-19 LAB — BASIC METABOLIC PANEL
Anion gap: 9 (ref 5–15)
BUN: 27 mg/dL — ABNORMAL HIGH (ref 8–23)
CO2: 20 mmol/L — ABNORMAL LOW (ref 22–32)
Calcium: 8.9 mg/dL (ref 8.9–10.3)
Chloride: 107 mmol/L (ref 98–111)
Creatinine, Ser: 1.26 mg/dL — ABNORMAL HIGH (ref 0.61–1.24)
GFR, Estimated: >60 mL/min (ref >60)
Glucose, Bld: 105 mg/dL — ABNORMAL HIGH (ref 70–99)
Potassium: 4.2 mmol/L (ref 3.5–5.1)
Sodium: 136 mmol/L (ref 135–145)

## 2021-12-19 MED ORDER — ONDANSETRON HCL 4 MG/2ML IJ SOLN
4.0000 mg | Freq: Once | INTRAMUSCULAR | Status: DC
  Filled 2021-12-19: qty 2

## 2021-12-19 MED ORDER — ALBUTEROL SULFATE (2.5 MG/3ML) 0.083% IN NEBU
10.0000 mg/h | INHALATION_SOLUTION | Freq: Once | RESPIRATORY_TRACT | Status: AC
  Administered 2021-12-19: 10 mg/h via RESPIRATORY_TRACT
  Filled 2021-12-19: qty 3

## 2021-12-19 MED ORDER — ONDANSETRON 4 MG PO TBDP: ORAL_TABLET | ORAL | 0 refills | Status: DC

## 2021-12-19 MED ORDER — ACETAMINOPHEN 325 MG PO TABS
650.0000 mg | ORAL_TABLET | Freq: Once | ORAL | Status: AC
  Administered 2021-12-19: 650 mg via ORAL
  Filled 2021-12-19: qty 2

## 2021-12-19 MED ORDER — METHYLPREDNISOLONE SODIUM SUCC 125 MG IJ SOLR
125.0000 mg | Freq: Once | INTRAMUSCULAR | Status: AC
  Administered 2021-12-19: 125 mg via INTRAVENOUS
  Filled 2021-12-19: qty 2

## 2021-12-19 MED ORDER — MAGNESIUM SULFATE 2 GM/50ML IV SOLN
2.0000 g | Freq: Once | INTRAVENOUS | Status: AC
Start: 2021-12-19 — End: 2021-12-19
  Administered 2021-12-19: 2 g via INTRAVENOUS
  Filled 2021-12-19: qty 50

## 2021-12-19 MED ORDER — PREDNISONE 10 MG PO TABS: 20.0000 mg | ORAL_TABLET | Freq: Every day | ORAL | 0 refills | Status: DC

## 2021-12-19 MED ORDER — ONDANSETRON 8 MG PO TBDP
8.0000 mg | ORAL_TABLET | Freq: Once | ORAL | Status: AC
  Administered 2021-12-19: 8 mg via ORAL
  Filled 2021-12-19: qty 1

## 2021-12-19 MED ORDER — IPRATROPIUM BROMIDE 0.02 % IN SOLN
0.5000 mg | Freq: Once | RESPIRATORY_TRACT | Status: AC
  Administered 2021-12-19: 0.5 mg via RESPIRATORY_TRACT
  Filled 2021-12-19: qty 2.5

## 2021-12-19 NOTE — ED Triage Notes (Signed)
Pt comes from home via EMS. Pt received TDAP shingles and flu vaccine today. Pt reports SOB, nausea vomiting diarrhea body aches and chills. Received vaccinations at 145pm. Symptom onset at 7pm  Bp 156/90 Pulse 80  Spo2 98 on RA  Cbg 122

## 2021-12-19 NOTE — ED Provider Notes (Signed)
[facility] DEPT Provider Note   CSN: [id number] Arrival date & time: 12/19/21  0053     History  Chief Complaint  Patient presents with   Shortness of Breath    James Barker is a 71 y.o. male.  Patient presents to the emergency department for evaluation of shortness of breath.  He does have a history of asthma and COPD.  Patient reports that symptoms began this evening.  He has associated body aches, nausea, vomiting and diarrhea.  He thinks it may be secondary to multiple vaccinations he received at his doctor's office earlier today.  He comes to the ER by ambulance.  He received albuterol during transport, reports no improvement.       Home Medications Prior to Admission medications   Medication Sig Start Date End Date Taking? Authorizing Provider  ondansetron (ZOFRAN-ODT) 4 MG disintegrating tablet '4mg'$  ODT q4 hours prn nausea/vomit 12/19/21  Yes Pietro Bonura, Gwenyth Allegra, MD  predniSONE (DELTASONE) 10 MG tablet Take 2 tablets (20 mg total) by mouth daily. 12/19/21  Yes Alvie Speltz, Gwenyth Allegra, MD  albuterol (PROVENTIL) (2.5 MG/3ML) 0.083% nebulizer solution Take 3 mLs (2.5 mg total) by nebulization every 6 (six) hours as needed for wheezing or shortness of breath. 04/26/21   Cobb, Karie Schwalbe, NP  albuterol (VENTOLIN HFA) 108 (90 Base) MCG/ACT inhaler TAKE 2 PUFFS BY MOUTH EVERY 6 HOURS AS NEEDED FOR WHEEZE OR SHORTNESS OF BREATH 11/02/21   Spero Geralds, MD  amLODipine (NORVASC) 10 MG tablet Take 1 tablet (10 mg total) by mouth daily. 05/21/21   Mercy Riding, MD  apixaban (ELIQUIS) 5 MG TABS tablet Take 1 tablet (5 mg total) by mouth 2 (two) times daily. 08/24/21   Spero Geralds, MD  atorvastatin (LIPITOR) 20 MG tablet Take 20 mg by mouth daily. 12/07/20   [provider]  clotrimazole (MYCELEX) 10 MG troche Take 1 tablet (10 mg total) by mouth 5 (five) times daily. Patient not taking: Reported on 08/24/2021 06/15/21   Spero Geralds, MD   Dupilumab (DUPIXENT) 300 MG/2ML SOPN Inject 300 mg into the skin every 14 days. 11/28/21   Spero Geralds, MD  fluticasone-salmeterol (ADVAIR HFA) [phone] MCG/ACT inhaler Inhale 2 puffs into the lungs 2 (two) times daily. Patient taking differently: Inhale 2 puffs into the lungs 2 (two) times daily. 12/22/20   Spero Geralds, MD  guaiFENesin (MUCINEX) 600 MG 12 hr tablet Take 1 tablet (600 mg total) by mouth 2 (two) times daily. Patient not taking: Reported on 08/24/2021 04/13/21   Clayton Bibles, NP  Misc. Devices MISC Please dispense 1 flutter valve for use after inhalers Patient not taking: Reported on 08/24/2021 02/20/21   Spero Geralds, MD  prochlorperazine (COMPAZINE) 10 MG tablet Take 1 tablet (10 mg total) by mouth every 6 (six) hours as needed for nausea or vomiting. 08/24/21   Wyatt Portela, MD  sodium chloride 0.9 % nebulizer solution Take 3 mLs by nebulization 3 (three) times daily as needed for wheezing. 05/08/21   Spero Geralds, MD  Tiotropium Bromide Monohydrate (SPIRIVA RESPIMAT) 2.5 MCG/ACT AERS Inhale 2 puffs into the lungs daily. 04/13/21   Cobb, Karie Schwalbe, NP      Allergies    Cheese and Lactose intolerance (gi)    Review of Systems   Review of Systems  Physical Exam Updated Vital Signs BP (!) 139/91   Pulse 80   Temp (!) 101.6 F (38.7 C) (Oral)  Resp 20   SpO2 99%  Physical Exam Vitals and nursing note reviewed.  Constitutional:      General: He is not in acute distress.    Appearance: He is well-developed.  HENT:     Head: Normocephalic and atraumatic.     Mouth/Throat:     Mouth: Mucous membranes are moist.  Eyes:     General: Vision grossly intact. Gaze aligned appropriately.     Extraocular Movements: Extraocular movements intact.     Conjunctiva/sclera: Conjunctivae normal.  Cardiovascular:     Rate and Rhythm: Normal rate and regular rhythm.     Pulses: Normal pulses.     Heart sounds: Normal heart sounds, S1 normal and S2 normal. No murmur  heard.    No friction rub. No gallop.  Pulmonary:     Effort: Pulmonary effort is normal. Tachypnea present. No respiratory distress.     Breath sounds: Decreased breath sounds and wheezing present.  Abdominal:     Palpations: Abdomen is soft.     Tenderness: There is no abdominal tenderness. There is no guarding or rebound.     Hernia: No hernia is present.  Musculoskeletal:        General: No swelling.     Cervical back: Full passive range of motion without pain, normal range of motion and neck supple. No pain with movement, spinous process tenderness or muscular tenderness. Normal range of motion.     Right lower leg: No edema.     Left lower leg: No edema.  Skin:    General: Skin is warm and dry.     Capillary Refill: Capillary refill takes less than 2 seconds.     Findings: No ecchymosis, erythema, lesion or wound.  Neurological:     Mental Status: He is alert and oriented to person, place, and time.     GCS: GCS eye subscore is 4. GCS verbal subscore is 5. GCS motor subscore is 6.     Cranial Nerves: Cranial nerves 2-12 are intact.     Sensory: Sensation is intact.     Motor: Motor function is intact. No weakness or abnormal muscle tone.     Coordination: Coordination is intact.  Psychiatric:        Mood and Affect: Mood normal.        Speech: Speech normal.        Behavior: Behavior normal.     ED Results / Procedures / Treatments   Labs (all labs ordered are listed, but only abnormal results are displayed) Labs Reviewed  CBC WITH DIFFERENTIAL/PLATELET - Abnormal; Notable for the following components:      Result Value   Hemoglobin 12.4 (*)    HCT 36.6 (*)    Neutro Abs 8.5 (*)    Lymphs Abs 0.2 (*)    All other components within normal limits  BASIC METABOLIC PANEL - Abnormal; Notable for the following components:   CO2 20 (*)    Glucose, Bld 105 (*)    BUN 27 (*)    Creatinine, Ser 1.26 (*)    All other components within normal limits  RESP PANEL BY RT-PCR  (FLU A&B, COVID) ARPGX2    EKG None  Radiology DG Chest Port 1 View  Result Date: 12/19/2021 CLINICAL DATA:  Shortness of breath and fevers EXAM: PORTABLE CHEST 1 VIEW COMPARISON:  09/16/2021 FINDINGS: Cardiac shadow is within normal limits. Mild atelectatic changes are noted in the left base. No bony abnormality is noted. No other focal  abnormality is seen. IMPRESSION: Mild left basilar atelectasis. Electronically Signed   By: Inez Catalina M.D.   On: 12/19/2021 01:25    Procedures Procedures    Medications Ordered in ED Medications  acetaminophen (TYLENOL) tablet 650 mg (has no administration in time range)  ondansetron (ZOFRAN) injection 4 mg (has no administration in time range)  methylPREDNISolone sodium succinate (SOLU-MEDROL) 125 mg/2 mL injection 125 mg (125 mg Intravenous Given 12/19/21 0213)  magnesium sulfate IVPB 2 g 50 mL (2 g Intravenous New Bag/Given 12/19/21 0223)  albuterol (PROVENTIL) (2.5 MG/3ML) 0.083% nebulizer solution (10 mg/hr Nebulization Given 12/19/21 0212)  ipratropium (ATROVENT) nebulizer solution 0.5 mg (0.5 mg Nebulization Given 12/19/21 0212)    ED Course/ Medical Decision Making/ A&P                           Medical Decision Making Amount and/or Complexity of Data Reviewed Labs: ordered. Decision-making details documented in ED Course. Radiology: ordered and independent interpretation performed. Decision-making details documented in ED Course.  Risk Prescription drug management.   Presents with acute onset of nausea, vomiting, diarrhea, generalized body aches and shortness of breath.  He does have a history of asthma/COPD syndrome.  Patient with significant wheezing on arrival.  Temperature 101.6.  This is likely reactive to the vaccinations he received.  Chest x-ray is clear.  Influenza, COVID swab negative.  Administered magnesium, Solu-Medrol, continuous nebulizer treatment.  Upon recheck his lungs are now clear, no further wheezing.   Patient feeling much better.  Vital signs remained stable, temperature down.  Will be appropriate for discharge, continued symptomatic treatment.  Prescribed prednisone burst.        Final Clinical Impression(s) / ED Diagnoses Final diagnoses:  Post-vaccination fever  Exacerbation of asthma, unspecified asthma severity, unspecified whether persistent    Rx / DC Orders ED Discharge Orders          Ordered    ondansetron (ZOFRAN-ODT) 4 MG disintegrating tablet        12/19/21 0330    predniSONE (DELTASONE) 10 MG tablet  Daily        12/19/21 0330              Orpah Greek, MD 12/19/21 [phone]

## 2021-12-20 ENCOUNTER — Other Ambulatory Visit: Payer: Self-pay

## 2021-12-21 ENCOUNTER — Telehealth: Payer: Self-pay

## 2021-12-21 NOTE — Telephone Encounter (Signed)
     Patient  visit on 11/29  at [facility] you been able to follow up with your primary care physician? Yes   The patient was or was not able to obtain any needed medicine or equipment. Yes   Are there diet recommendations that you are having difficulty following? Na   Patient expresses understanding of discharge instructions and education provided has no other needs at this time.  Yes      Schneider, [facility], Care Management  [phone] [address], [place], [place] [address] Phone: [phone] Email: [email or web address]

## 2021-12-27 ENCOUNTER — Other Ambulatory Visit (HOSPITAL_COMMUNITY): Payer: Self-pay

## 2021-12-31 ENCOUNTER — Other Ambulatory Visit: Payer: Self-pay | Admitting: Internal Medicine

## 2022-01-01 ENCOUNTER — Other Ambulatory Visit (HOSPITAL_COMMUNITY): Payer: Self-pay

## 2022-01-01 DIAGNOSIS — Z23 Encounter for immunization: Secondary | ICD-10-CM | POA: Diagnosis not present

## 2022-01-04 ENCOUNTER — Telehealth: Payer: Self-pay | Admitting: Pharmacist

## 2022-01-04 NOTE — Telephone Encounter (Signed)
Per [employer], [name] for [place] set to expire. Submitted a Prior Authorization RENEWAL request to [facility] for [place] via CoverMyMeds. Will update once we receive a response.  Key: Sharman Crate, PharmD, MPH, BCPS, CPP Clinical Pharmacist (Rheumatology and Pulmonology)

## 2022-01-11 NOTE — Telephone Encounter (Signed)
Received notification from [facility] regarding a prior authorization for [place]. Authorization has been APPROVED from 01/11/2022 to 01/21/2023. Approval letter sent to scan center.  Patient can continue to fill through [place]: [phone]   Authorization # [id number] Phone # [phone]  Knox Saliva, PharmD, MPH, BCPS, CPP Clinical Pharmacist (Rheumatology and Pulmonology)

## 2022-01-11 NOTE — Telephone Encounter (Signed)
Clinicals expired. Renewed authorization for Dupixent through [employer]  CMM Key: [id number]  Knox Saliva, PharmD, MPH, BCPS, CPP Clinical Pharmacist (Rheumatology and Pulmonology)

## 2022-01-24 ENCOUNTER — Other Ambulatory Visit (HOSPITAL_COMMUNITY): Payer: Self-pay

## 2022-01-25 ENCOUNTER — Other Ambulatory Visit (HOSPITAL_COMMUNITY): Payer: Self-pay

## 2022-02-20 DIAGNOSIS — J449 Chronic obstructive pulmonary disease, unspecified: Secondary | ICD-10-CM | POA: Diagnosis not present

## 2022-02-20 DIAGNOSIS — I10 Essential (primary) hypertension: Secondary | ICD-10-CM | POA: Diagnosis not present

## 2022-02-20 DIAGNOSIS — E7800 Pure hypercholesterolemia, unspecified: Secondary | ICD-10-CM | POA: Diagnosis not present

## 2022-02-21 ENCOUNTER — Other Ambulatory Visit (HOSPITAL_COMMUNITY): Payer: Self-pay

## 2022-02-22 ENCOUNTER — Other Ambulatory Visit (HOSPITAL_COMMUNITY): Payer: Self-pay

## 2022-02-27 DIAGNOSIS — N281 Cyst of kidney, acquired: Secondary | ICD-10-CM | POA: Diagnosis not present

## 2022-02-27 DIAGNOSIS — Z8551 Personal history of malignant neoplasm of bladder: Secondary | ICD-10-CM | POA: Diagnosis not present

## 2022-02-27 DIAGNOSIS — N323 Diverticulum of bladder: Secondary | ICD-10-CM | POA: Diagnosis not present

## 2022-02-28 DIAGNOSIS — M19031 Primary osteoarthritis, right wrist: Secondary | ICD-10-CM | POA: Diagnosis not present

## 2022-02-28 DIAGNOSIS — M1811 Unilateral primary osteoarthritis of first carpometacarpal joint, right hand: Secondary | ICD-10-CM | POA: Diagnosis not present

## 2022-03-03 ENCOUNTER — Other Ambulatory Visit: Payer: Self-pay | Admitting: Internal Medicine

## 2022-03-04 ENCOUNTER — Encounter: Payer: Self-pay | Admitting: Oncology

## 2022-03-04 NOTE — Progress Notes (Unsigned)
History of Present Illness James Barker is a 72 y.o. male with  history of severe allergic asthma , COPD overlap syndrome.Dupixent started 04/26/2021.  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 He is on chemotherapy with this. He is followed by Dr. Shearon Stalls   03/04/2022 Pt. Presents for an acute visit. Last exacerbation was 08/2021.   Test Results:     Latest Ref Rng & Units 12/19/2021    2:17 AM 10/26/2021   11:50 AM 10/05/2021    9:55 AM  CBC  WBC 4.0 - 10.5 K/uL 9.4  7.2  7.9   Hemoglobin 13.0 - 17.0 g/dL 12.4  11.9  12.8   Hematocrit 39.0 - 52.0 % 36.6  35.2  37.7   Platelets 150 - 400 K/uL 171  197  209        Latest Ref Rng & Units 12/19/2021    2:17 AM 10/26/2021   11:50 AM 10/05/2021    9:55 AM  BMP  Glucose 70 - 99 mg/dL 105  115  95   BUN 8 - 23 mg/dL 27  20  29   $ Creatinine 0.61 - 1.24 mg/dL 1.26  1.29  1.21   Sodium 135 - 145 mmol/L 136  140  140   Potassium 3.5 - 5.1 mmol/L 4.2  4.0  3.9   Chloride 98 - 111 mmol/L 107  111  111   CO2 22 - 32 mmol/L 20  25  23   $ Calcium 8.9 - 10.3 mg/dL 8.9  8.8  9.5     BNP    Component Value Date/Time   BNP 101.3 (H) 05/15/2021 1105    ProBNP No results found for: "PROBNP"  PFT    Component Value Date/Time   FEV1PRE 1.57 02/20/2021 1458   FEV1POST 1.70 02/20/2021 1458   FVCPRE 3.20 02/20/2021 1458   FVCPOST 3.30 02/20/2021 1458   TLC 6.54 02/20/2021 1458   DLCOUNC 14.74 02/20/2021 1458   PREFEV1FVCRT 49 02/20/2021 1458   PSTFEV1FVCRT 51 02/20/2021 1458    No results found.   Past medical hx Past Medical History:  Diagnosis Date   Arthritis    Asthma-COPD overlap syndrome    pulmonology--- dr n desai;   last exacerbation 05-08-2021   Benign localized prostatic hyperplasia with lower urinary tract symptoms (LUTS)    Chronic cough    due to copd   Dairy product intolerance    DOE (dyspnea on exertion)    d/t asthma/ COPD   Emphysema, unspecified ([place])    Full  dentures    History of bladder cancer 2020   s/p TURBT w/ chemo instillation   History of COVID-19    per pt 2021 (no at same time of pneumonia)  w/ moderate symptoms , recovered at home, that resolved back to baseline   History of hepatitis C 2018   hx chronic hepatitis C,  per pt treated and told was cured   History of latent tuberculosis 2017   pt tested positive for TB exposure, not active, treated w/ INH for 9 months   History of MAI infection 2021   never treated   History of pulmonary embolism    (05-09-2021  pt stated never had clot prior to 2017 , none since 2020, had negative blood disorder work-up)//  2017  treated w/ xarelto 6 months//  and recurrent 04/ 2020 in setting severe UTI and deconditioning w/ prolonged admission due to intolerant to anticoagulants IVC  filter inserted   Hypertension    OSA (obstructive sleep apnea)    per pt dx yrs ago, intolerant to cpap   Pulmonary eosinophilia (HCC)    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   Weak urinary stream    Wears glasses      Social History   Tobacco Use   Smoking status: Former    Packs/day: 1.00    Years: 20.00    Total pack years: 20.00    Types: Cigarettes    Quit date: 1992    Years since quitting: 32.1   Smokeless tobacco: Never  Vaping Use   Vaping Use: Never used  Substance Use Topics   Alcohol use: Not Currently   Drug use: Yes    Types: Marijuana    Comment: 05-09-2021  per pt smokes occasional    Mr.Arnesen reports that he quit smoking about 32 years ago. His smoking use included cigarettes. He has a 20.00 pack-year smoking history. He has never used smokeless tobacco. He reports that he does not currently use alcohol. He reports current drug use. Drug: Marijuana.  Tobacco Cessation: Counseling given: Not Answered   Past surgical hx, Family hx, Social hx all reviewed.  Current Outpatient Medications on File Prior to Visit  Medication Sig   ADVAIR HFA 230-21 MCG/ACT inhaler INHALE 2 PUFFS INTO THE  LUNGS TWICE A DAY   albuterol (PROVENTIL) (2.5 MG/3ML) 0.083% nebulizer solution Take 3 mLs (2.5 mg total) by nebulization every 6 (six) hours as needed for wheezing or shortness of breath.   albuterol (VENTOLIN HFA) 108 (90 Base) MCG/ACT inhaler TAKE 2 PUFFS BY MOUTH EVERY 6 HOURS AS NEEDED FOR WHEEZE OR SHORTNESS OF BREATH   amLODipine (NORVASC) 10 MG tablet Take 1 tablet (10 mg total) by mouth daily.   apixaban (ELIQUIS) 5 MG TABS tablet Take 1 tablet (5 mg total) by mouth 2 (two) times daily.   atorvastatin (LIPITOR) 20 MG tablet Take 20 mg by mouth daily.   clotrimazole (MYCELEX) 10 MG troche Take 1 tablet (10 mg total) by mouth 5 (five) times daily. (Patient not taking: Reported on 08/24/2021)   Dupilumab (DUPIXENT) 300 MG/2ML SOPN Inject 300 mg into the skin every 14 days.   guaiFENesin (MUCINEX) 600 MG 12 hr tablet Take 1 tablet (600 mg total) by mouth 2 (two) times daily. (Patient not taking: Reported on 08/24/2021)   Misc. Devices MISC Please dispense 1 flutter valve for use after inhalers (Patient not taking: Reported on 08/24/2021)   ondansetron (ZOFRAN-ODT) 4 MG disintegrating tablet 86m ODT q4 hours prn nausea/vomit   predniSONE (DELTASONE) 10 MG tablet Take 2 tablets (20 mg total) by mouth daily.   prochlorperazine (COMPAZINE) 10 MG tablet Take 1 tablet (10 mg total) by mouth every 6 (six) hours as needed for nausea or vomiting.   sodium chloride 0.9 % nebulizer solution Take 3 mLs by nebulization 3 (three) times daily as needed for wheezing.   Tiotropium Bromide Monohydrate (SPIRIVA RESPIMAT) 2.5 MCG/ACT AERS Inhale 2 puffs into the lungs daily.   No current facility-administered medications on file prior to visit.     Allergies  Allergen Reactions   Cheese Nausea And Vomiting   Lactose Intolerance (Gi) Diarrhea and Nausea And Vomiting    Review Of Systems:  Constitutional:   No  weight loss, night sweats,  Fevers, chills, fatigue, or  lassitude.  HEENT:   No headaches,   Difficulty swallowing,  Tooth/dental problems, or  Sore throat,  No sneezing, itching, ear ache, nasal congestion, post nasal drip,   CV:  No chest pain,  Orthopnea, PND, swelling in lower extremities, anasarca, dizziness, palpitations, syncope.   GI  No heartburn, indigestion, abdominal pain, nausea, vomiting, diarrhea, change in bowel habits, loss of appetite, bloody stools.   Resp: No shortness of breath with exertion or at rest.  No excess mucus, no productive cough,  No non-productive cough,  No coughing up of blood.  No change in color of mucus.  No wheezing.  No chest wall deformity  Skin: no rash or lesions.  GU: no dysuria, change in color of urine, no urgency or frequency.  No flank pain, no hematuria   MS:  No joint pain or swelling.  No decreased range of motion.  No back pain.  Psych:  No change in mood or affect. No depression or anxiety.  No memory loss.   Vital Signs There were no vitals taken for this visit.   Physical Exam:  General- No distress,  A&Ox3 ENT: No sinus tenderness, TM clear, pale nasal mucosa, no oral exudate,no post nasal drip, no LAN Cardiac: S1, S2, regular rate and rhythm, no murmur Chest: No wheeze/ rales/ dullness; no accessory muscle use, no nasal flaring, no sternal retractions Abd.: Soft Non-tender Ext: No clubbing cyanosis, edema Neuro:  normal strength Skin: No rashes, warm and dry Psych: normal mood and behavior   Assessment/Plan  No problem-specific Assessment & Plan notes found for this encounter.    Magdalen Spatz, NP 03/04/2022  1:42 PM

## 2022-03-05 ENCOUNTER — Ambulatory Visit (INDEPENDENT_AMBULATORY_CARE_PROVIDER_SITE_OTHER): Payer: 59

## 2022-03-05 ENCOUNTER — Encounter: Payer: Self-pay | Admitting: Acute Care

## 2022-03-05 ENCOUNTER — Other Ambulatory Visit: Payer: Self-pay

## 2022-03-05 ENCOUNTER — Ambulatory Visit (INDEPENDENT_AMBULATORY_CARE_PROVIDER_SITE_OTHER): Payer: 59 | Admitting: Acute Care

## 2022-03-05 VITALS — BP 122/80 | HR 67 | Temp 98.2°F | Ht 67.0 in | Wt 173.0 lb

## 2022-03-05 DIAGNOSIS — J441 Chronic obstructive pulmonary disease with (acute) exacerbation: Secondary | ICD-10-CM | POA: Diagnosis not present

## 2022-03-05 DIAGNOSIS — J45901 Unspecified asthma with (acute) exacerbation: Secondary | ICD-10-CM | POA: Diagnosis not present

## 2022-03-05 DIAGNOSIS — Z8701 Personal history of pneumonia (recurrent): Secondary | ICD-10-CM | POA: Diagnosis not present

## 2022-03-05 DIAGNOSIS — R051 Acute cough: Secondary | ICD-10-CM

## 2022-03-05 DIAGNOSIS — R062 Wheezing: Secondary | ICD-10-CM

## 2022-03-05 DIAGNOSIS — B349 Viral infection, unspecified: Secondary | ICD-10-CM

## 2022-03-05 MED ORDER — PREDNISONE 10 MG PO TABS: ORAL_TABLET | ORAL | 0 refills | Status: DC

## 2022-03-05 MED ORDER — AZITHROMYCIN 250 MG PO TABS: ORAL_TABLET | ORAL | 0 refills | Status: DC

## 2022-03-05 MED ORDER — HYDROCODONE BIT-HOMATROP MBR 5-1.5 MG/5ML PO SOLN: 5.0000 mL | Freq: Every evening | ORAL | 0 refills | Status: DC | PRN

## 2022-03-05 NOTE — Patient Instructions (Addendum)
It is good to see you today.  We will test you for flu and Covid today. They were both negative We will do a CXR today. >> This showed no acute intrathoracic process.  We will do a prednisone taper  Prednisone taper; 10 mg tablets: 4 tabs x 2 days, 3 tabs x 2 days, 2 tabs x 2 days 1 tab x 2 days then stop.  We will send in a z pack. This is an antibiotic.  Take 2 tablets today, then one tablet daily for the next 4 days. Complete the prescription Continue spiriva, advair, mucinex,  prn albuterol daily Please drink plenty of water.  I have sent in a prescription for cough syrup.  Take 5 cc's at bedtime as needed for cough. Do not drive if you have taken the medication. Do not take  Dupixent injection today Follow up in 1 week, with Judson Roch NP or Dr. Shearon Stalls. If better at that visit  it is ok to take [place].  Letter for out of work for the next 4 days. If you get worse, not better, please call or seek emergency care.

## 2022-03-05 NOTE — Progress Notes (Unsigned)
Received flu vaccine several months ago.

## 2022-03-06 ENCOUNTER — Other Ambulatory Visit: Payer: Self-pay

## 2022-03-06 ENCOUNTER — Encounter: Payer: Self-pay | Admitting: Acute Care

## 2022-03-06 LAB — POC COVID19 BINAXNOW: SARS Coronavirus 2 Ag: NEGATIVE

## 2022-03-06 LAB — POCT INFLUENZA A/B
Influenza A, POC: NEGATIVE
Influenza B, POC: NEGATIVE

## 2022-03-11 DIAGNOSIS — M67912 Unspecified disorder of synovium and tendon, left shoulder: Secondary | ICD-10-CM | POA: Diagnosis not present

## 2022-03-13 ENCOUNTER — Encounter: Payer: Self-pay | Admitting: Acute Care

## 2022-03-13 ENCOUNTER — Ambulatory Visit (INDEPENDENT_AMBULATORY_CARE_PROVIDER_SITE_OTHER): Payer: 59 | Admitting: Acute Care

## 2022-03-13 VITALS — BP 110/60 | HR 58 | Temp 98.8°F | Ht 67.0 in | Wt 181.4 lb

## 2022-03-13 DIAGNOSIS — R112 Nausea with vomiting, unspecified: Secondary | ICD-10-CM

## 2022-03-13 DIAGNOSIS — B349 Viral infection, unspecified: Secondary | ICD-10-CM | POA: Diagnosis not present

## 2022-03-13 NOTE — Patient Instructions (Signed)
It is good to see you today Continue spiriva, advair, mucinex,  prn albuterol daily  It is ok to take Dupixant  injection now that you are better.  Call if wheezing does not get better after Dupixent so we can send in additional prednisone We will refer to GI for nausea you are experiencing.  Do not take Compazine with Hycodan cough syrup, take one or the other.  Follow up with PCP regarding eye exam.  Follow up with Dr. Shearon Stalls in 3 months. Call if you need [place] sooner.  Please contact office for sooner follow up if symptoms do not improve or worsen or seek emergency care

## 2022-03-13 NOTE — Progress Notes (Signed)
History of Present Illness James Barker is a 72 y.o. male with  history of severe allergic asthma , COPD overlap syndrome. Dupixent started 04/26/2021.  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 He is on chemotherapy with this. He is followed by Dr. Shearon Stalls Current Regimen: spiriva, advair, mucinex,  prn albuterol usually takes 3-4 times/day. dupixent.    03/13/2022 Pt. Presents for follow up after acute cough.Pt. was seen 03/05/2022 and treated with Pred taper, z pack , and prescription cough syrup as needed for cough at HS.  CXR at the time of the visit was clear. Covid,and  Flu were all negative. He denied fever but stated he had green secretions. Acute cough was the symptom that was bothering him most. We held on his Dupizent dose until he came in for follow up today.  He states he completed all prescription medications . Pt. States he has been better. He is compliant with his spiriva, advair, mucinex,and he is using his rescue albuterol 1-2 times a day. He does still have a cough, secretions are no longer green, but are more of a yellow color. He is still using the hycodan as needed at night. He is a bit wheezy, so we will clear him for his dupizent use.   Test Results: 03/05/2022 CXR Frontal and lateral views of the chest are obtained on 3 images. Cardiac silhouette is unremarkable. No acute airspace disease, effusion, or pneumothorax. Linear density in the right perihilar region consistent with subsegmental atelectasis or scarring. No acute bony abnormality.   IMPRESSION: 1. No acute intrathoracic process. These results were shared with the patient at the time of the office visit.    Covid and Flu PCR negative on 03/05/2022     Latest Ref Rng & Units 12/19/2021    2:17 AM 10/26/2021   11:50 AM 10/05/2021    9:55 AM  CBC  WBC 4.0 - 10.5 K/uL 9.4  7.2  7.9   Hemoglobin 13.0 - 17.0 g/dL 12.4  11.9  12.8   Hematocrit 39.0 - 52.0 % 36.6  35.2   37.7   Platelets 150 - 400 K/uL 171  197  209        Latest Ref Rng & Units 12/19/2021    2:17 AM 10/26/2021   11:50 AM 10/05/2021    9:55 AM  BMP  Glucose 70 - 99 mg/dL 105  115  95   BUN 8 - 23 mg/dL 27  20  29   $ Creatinine 0.61 - 1.24 mg/dL 1.26  1.29  1.21   Sodium 135 - 145 mmol/L 136  140  140   Potassium 3.5 - 5.1 mmol/L 4.2  4.0  3.9   Chloride 98 - 111 mmol/L 107  111  111   CO2 22 - 32 mmol/L 20  25  23   $ Calcium 8.9 - 10.3 mg/dL 8.9  8.8  9.5     BNP    Component Value Date/Time   BNP 101.3 (H) 05/15/2021 1105    ProBNP No results found for: "PROBNP"  PFT    Component Value Date/Time   FEV1PRE 1.57 02/20/2021 1458   FEV1POST 1.70 02/20/2021 1458   FVCPRE 3.20 02/20/2021 1458   FVCPOST 3.30 02/20/2021 1458   TLC 6.54 02/20/2021 1458   DLCOUNC 14.74 02/20/2021 1458   PREFEV1FVCRT 49 02/20/2021 1458   PSTFEV1FVCRT 51 02/20/2021 1458    DG Chest 2 View  Result Date: 03/05/2022 CLINICAL DATA:  Pneumonia EXAM: CHEST - 2 VIEW COMPARISON:  12/19/2021, 09/16/2021 FINDINGS: Frontal and lateral views of the chest are obtained on 3 images. Cardiac silhouette is unremarkable. No acute airspace disease, effusion, or pneumothorax. Linear density in the right perihilar region consistent with subsegmental atelectasis or scarring. No acute bony abnormality. IMPRESSION: 1. No acute intrathoracic process. Electronically Signed   By: Randa Ngo M.D.   On: 03/05/2022 09:03     Past medical hx Past Medical History:  Diagnosis Date   Arthritis    Asthma-COPD overlap syndrome    pulmonology--- dr n desai;   last exacerbation 05-08-2021   Benign localized prostatic hyperplasia with lower urinary tract symptoms (LUTS)    Chronic cough    due to copd   Dairy product intolerance    DOE (dyspnea on exertion)    d/t asthma/ COPD   Emphysema, unspecified ([place])    Full dentures    History of bladder cancer 2020   s/p TURBT w/ chemo instillation   History of COVID-19    per  pt 2021 (no at same time of pneumonia)  w/ moderate symptoms , recovered at home, that resolved back to baseline   History of hepatitis C 2018   hx chronic hepatitis C,  per pt treated and told was cured   History of latent tuberculosis 2017   pt tested positive for TB exposure, not active, treated w/ INH for 9 months   History of MAI infection 2021   never treated   History of pulmonary embolism    (05-09-2021  pt stated never had clot prior to 2017 , none since 2020, had negative blood disorder work-up)//  2017  treated w/ xarelto 6 months//  and recurrent 04/ 2020 in setting severe UTI and deconditioning w/ prolonged admission due to intolerant to anticoagulants IVC filter inserted   Hypertension    OSA (obstructive sleep apnea)    per pt dx yrs ago, intolerant to cpap   Pulmonary eosinophilia (HCC)    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   Weak urinary stream    Wears glasses      Social History   Tobacco Use   Smoking status: Former    Packs/day: 1.00    Years: 20.00    Total pack years: 20.00    Types: Cigarettes    Quit date: 1992    Years since quitting: 32.1   Smokeless tobacco: Never  Vaping Use   Vaping Use: Never used  Substance Use Topics   Alcohol use: Not Currently   Drug use: Yes    Types: Marijuana    Comment: 05-09-2021  per pt smokes occasional    James Barker reports that he quit smoking about 32 years ago. His smoking use included cigarettes. He has a 20.00 pack-year smoking history. He has never used smokeless tobacco. He reports that he does not currently use alcohol. He reports current drug use. Drug: Marijuana.  Tobacco Cessation: Former smoker , quit 1992 with a 20 pack year smoking history   Past surgical hx, Family hx, Social hx all reviewed.  Current Outpatient Medications on File Prior to Visit  Medication Sig   ADVAIR HFA 230-21 MCG/ACT inhaler INHALE 2 PUFFS INTO THE LUNGS TWICE A DAY   albuterol (PROVENTIL) (2.5 MG/3ML) 0.083% nebulizer  solution Take 3 mLs (2.5 mg total) by nebulization every 6 (six) hours as needed for wheezing or shortness of breath.   albuterol (VENTOLIN HFA) 108 (90 Base) MCG/ACT inhaler TAKE 2  PUFFS BY MOUTH EVERY 6 HOURS AS NEEDED FOR WHEEZE OR SHORTNESS OF BREATH   amLODipine (NORVASC) 10 MG tablet Take 1 tablet (10 mg total) by mouth daily.   atorvastatin (LIPITOR) 20 MG tablet Take 20 mg by mouth daily.   Dupilumab (DUPIXENT) 300 MG/2ML SOPN Inject 300 mg into the skin every 14 days.   ELIQUIS 5 MG TABS tablet TAKE 1 TABLET BY MOUTH TWICE A DAY   guaiFENesin (MUCINEX) 600 MG 12 hr tablet Take 1 tablet (600 mg total) by mouth 2 (two) times daily.   HYDROcodone bit-homatropine (HYCODAN) 5-1.5 MG/5ML syrup Take 5 mLs by mouth at bedtime as needed for cough.   Misc. Devices MISC Please dispense 1 flutter valve for use after inhalers   ondansetron (ZOFRAN-ODT) 4 MG disintegrating tablet 79m ODT q4 hours prn nausea/vomit   prochlorperazine (COMPAZINE) 10 MG tablet Take 1 tablet (10 mg total) by mouth every 6 (six) hours as needed for nausea or vomiting.   sodium chloride 0.9 % nebulizer solution Take 3 mLs by nebulization 3 (three) times daily as needed for wheezing.   Tiotropium Bromide Monohydrate (SPIRIVA RESPIMAT) 2.5 MCG/ACT AERS Inhale 2 puffs into the lungs daily.   No current facility-administered medications on file prior to visit.     Allergies  Allergen Reactions   Cheese Nausea And Vomiting   Lactose Intolerance (Gi) Diarrhea and Nausea And Vomiting    Review Of Systems:  Constitutional:   No  weight loss, night sweats,  Fevers, chills, fatigue, or  lassitude.  HEENT:   No headaches,  Difficulty swallowing,  Tooth/dental problems, or  Sore throat,                No sneezing, itching, ear ache, nasal congestion, post nasal drip,   CV:  No chest pain,  Orthopnea, PND, swelling in lower extremities, anasarca, dizziness, palpitations, syncope.   GI  No heartburn, indigestion, abdominal  pain, nausea, vomiting, diarrhea, change in bowel habits, loss of appetite, bloody stools.   Resp: No shortness of breath with exertion or at rest.  + excess mucus, + productive cough,  No non-productive cough,  No coughing up of blood.  No change in color of mucus.  + wheezing.  No chest wall deformity  Skin: no rash or lesions.  GU: no dysuria, change in color of urine, no urgency or frequency.  No flank pain, no hematuria   MS:  No joint pain or swelling.  No decreased range of motion.  No back pain.  Psych:  No change in mood or affect. No depression or anxiety.  No memory loss.   Vital Signs BP 110/60 (BP Location: Right Arm, Patient Position: Sitting, Cuff Size: Normal)   Pulse (!) 58   Temp 98.8 F (37.1 C) (Oral)   Ht 5' 7"$  (1.702 m)   Wt 181 lb 6.4 oz (82.3 kg)   SpO2 96%   BMI 28.41 kg/m    Physical Exam:  General- No distress,  A&Ox3, pleasant  ENT: No sinus tenderness, TM clear, pale nasal mucosa, no oral exudate,+  post nasal drip, no LAN Cardiac: S1, S2, regular rate and rhythm, no murmur Chest: + wheeze/ No rales/ dullness; no accessory muscle use, no nasal flaring, no sternal retractions Abd.: Soft Non-tender, ND, BS +, Body mass index is 28.41 kg/m.  Ext: No clubbing cyanosis, edema Neuro:  normal strength, MAE x 4, A&O x 3 Skin: No rashes, warm and dry, no lesions  Psych: normal mood and  behavior   Assessment/Plan Resolved viral illness  Plan Continue spiriva, advair, mucinex,  prn albuterol daily  It is ok to take Dupixant  injection now that you are better.  Call if wheezing does not get better after Dupixent so we can send in additional prednisone We will refer to GI for nausea you are experiencing.  Do not take Compazine with Hycodan cough syrup, take one or the other.  Follow up with PCP regarding eye exam.  Follow up with Dr. Shearon Stalls in 3 months. Return to work Call if you need [place] sooner.  Please contact office for sooner follow up if symptoms  do not improve or worsen or seek emergency care      I spent 35 minutes dedicated to the care of this patient on the date of this encounter to include pre-visit review of records, face-to-face time with the patient discussing conditions above, post visit ordering of testing, clinical documentation with the electronic health record, making appropriate referrals as documented, and communicating necessary information to the patient's healthcare team.     Magdalen Spatz, NP 03/13/2022  1:48 PM

## 2022-03-14 ENCOUNTER — Other Ambulatory Visit: Payer: Self-pay

## 2022-03-21 ENCOUNTER — Other Ambulatory Visit (HOSPITAL_COMMUNITY): Payer: Self-pay

## 2022-04-01 ENCOUNTER — Other Ambulatory Visit: Payer: Self-pay

## 2022-04-01 ENCOUNTER — Other Ambulatory Visit (HOSPITAL_COMMUNITY): Payer: Self-pay

## 2022-04-09 DIAGNOSIS — R21 Rash and other nonspecific skin eruption: Secondary | ICD-10-CM | POA: Diagnosis not present

## 2022-04-10 DIAGNOSIS — M25512 Pain in left shoulder: Secondary | ICD-10-CM | POA: Diagnosis not present

## 2022-04-12 ENCOUNTER — Other Ambulatory Visit: Payer: Self-pay | Admitting: Hematology and Oncology

## 2022-04-18 DIAGNOSIS — M25512 Pain in left shoulder: Secondary | ICD-10-CM | POA: Diagnosis not present

## 2022-04-22 DIAGNOSIS — M75122 Complete rotator cuff tear or rupture of left shoulder, not specified as traumatic: Secondary | ICD-10-CM | POA: Diagnosis not present

## 2022-04-26 ENCOUNTER — Other Ambulatory Visit: Payer: Self-pay | Admitting: Orthopedic Surgery

## 2022-04-29 ENCOUNTER — Telehealth: Payer: Self-pay | Admitting: Internal Medicine

## 2022-04-29 NOTE — Progress Notes (Addendum)
COVID Vaccine Completed: yes  Date of COVID positive in last 90 days: no  PCP - Horton Marshall, PA Cardiologist -  Pulmonologist- Kandice Robinsons, FNP LOV 03/13/22  Clearance by Durel Salts 04/29/22 in Epic   Chest x-ray - 03/05/22 Epic EKG - 09/18/21 Epic Stress Test - yes 2 years ago per pt ECHO - 05/16/21 Epic Cardiac Cath - n/a Pacemaker/ICD device last checked: n/a Spinal Cord Stimulator: n/a  Bowel Prep - no  Sleep Study - yes, positive CPAP - no  Fasting Blood Sugar - n/a Checks Blood Sugar _____ times a day  Last dose of GLP1 agonist-  N/A GLP1 instructions:  N/A   Last dose of SGLT-2 inhibitors-  N/A SGLT-2 instructions: N/A   Blood Thinner Instructions: Eliquis, no instructions. Instructed patient to call prescriber Aspirin Instructions: Last Dose:  Activity level: Can go up a flight of stairs and perform activities of daily living without stopping and without symptoms of chest pain. SOB and wheezing with activity, pt has COPD and asthma   Anesthesia review: HTN, COPD, hep c, OSA, DOE, in ED 12/19/21 for post vaccination fever and SOB, bladder cancer  Patient denies shortness of breath, fever, cough and chest pain at PAT appointment  Patient verbalized understanding of instructions that were given to them at the PAT appointment. Patient was also instructed that they will need to review over the PAT instructions again at home before surgery.

## 2022-04-29 NOTE — Telephone Encounter (Signed)
Fax received from Dr. Jones Broom with [facility] to perform a Left Shoulder Arthroscopy Rotator Cuff Repair on patient.  Patient needs surgery clearance. Surgery is 05/09/2022. Patient was seen on 03/13/2022-acute visit. Office protocol is a risk assessment can be sent to surgeon if patient has been seen in 60 days or less.   Sending to Dr. Celine Mans for risk assessment or recommendations if patient needs to be seen in office prior to surgical procedure.    Dr. Celine Mans can I use a held spot to get patient scheduled for surgical clearance. No APP's have anything available

## 2022-04-29 NOTE — Telephone Encounter (Signed)
Preoperative evaluation for left shoulder arthroplasty for James Barker   ASSESSMENT AND RECOMMENDATIONS:  Preoperative Risk Calculation: The features of this patient's history that contribute to the pulmonary risk assessment include: Age, COPD, General anesthesia,  This patient has a low risk of post-operative pulmonary complications by ARISCAT Index.  The absolute assessment of risk/benefit of the procedure is deferred to the primary team's evaluation.  - Patient's Estimated risk of postoperative respiratory failure is 1.6% based on the ARISCAT Index.   0 to 25 points: Low risk: 1.6% pulmonary complication rate  26 to 44 points: Intermediate risk: 13.3% pulmonary complication rate  45 to 123 points: High risk: 42.1% pulmonary complication rate  Postoperative respiratory failure (PRF) is considered as failure to wean from mechanical ventilation within 48 hours of surgery or unplanned intubation/reintubation postoperatively. The validated risk calculator provides a risk estimate of PRF and is anticipated to aid in surgical decision-making and informed patient consent.  However risk can be accepted given the potential benefit of this intervention and it is not prohibitive.  RECOMMENDATIONS:  In order to minimize the risk of complications and optimize pulmonary status, we recommend the following:  - Encourage aggressive incentive spirometry hourly both peri-operatively and post-operatively as tolerated  - Early ambulation and physical therapy as tolerated post-operatively - Adequate pain control especially in the setting of abdominal and thoracic surgery - Bronchodilators as needed for wheezing or shortness of breath - Oral steroids only if the patient appears to have component of COPD exacerbation, otherwise no routine utilization needed - Ideally we recommend smoking cessation for >4 weeks before any intervention - Intraoperatively keep OR time to the shortest as  possible   ARISCAT: Mazo et al. Anesthesiology 2014; [id number]  Durel Salts, MD Pulmonary and Critical Care Medicine [facility] 04/29/2022 4:55 PM Pager: see AMION  If no response to pager, please call critical care on call (see AMION) until 7pm After 7:00 pm call Elink

## 2022-04-30 ENCOUNTER — Other Ambulatory Visit: Payer: Self-pay

## 2022-04-30 NOTE — Progress Notes (Signed)
Please place orders for PAT appointment scheduled 05/01/22.

## 2022-04-30 NOTE — Telephone Encounter (Signed)
OV notes and clearance form have been faxed back to [facility]. Nothing further needed at this time.  

## 2022-04-30 NOTE — Patient Instructions (Signed)
SURGICAL WAITING ROOM VISITATION  Patients having surgery or a procedure may have no more than 2 support people in the waiting area - these visitors may rotate.    Children under the age of 72 must have an adult with them who is not the patient.  Due to an increase in RSV and influenza rates and associated hospitalizations, children ages 6712 and under may not visit patients in [facility] hospitals.  If the patient needs to stay at the hospital during part of their recovery, the visitor guidelines for inpatient rooms apply. Pre-op nurse will coordinate an appropriate time for 1 support person to accompany patient in pre-op.  This support person may not rotate.    Please refer to the [facility] website for the visitor guidelines for Inpatients (after your surgery is over and you are in a regular room).    Your procedure is scheduled on: 05/09/22   [facility]    Report to admitting at 9:40 AM   Call this number if you have problems the morning of surgery [phone]   Do not eat food :After Midnight.   After Midnight you may have the following liquids until 8:55 AM DAY OF SURGERY  Water Non-Citrus Juices (without pulp, NO RED-Apple, White grape, White cranberry) Black Coffee (NO MILK/CREAM OR CREAMERS, sugar ok)  Clear Tea (NO MILK/CREAM OR CREAMERS, sugar ok) regular and decaf                             Plain Jell-O (NO RED)                                           Fruit ices (not with fruit pulp, NO RED)                                     Popsicles (NO RED)                                                               Sports drinks like Gatorade (NO RED)          If you have questions, please contact your surgeon's office.   FOLLOW BOWEL PREP AND ANY ADDITIONAL PRE OP INSTRUCTIONS YOU RECEIVED FROM YOUR SURGEON'S OFFICE!!!     Oral Hygiene is also important to reduce your risk of infection.                                    Remember -  BRUSH YOUR TEETH THE MORNING OF SURGERY WITH YOUR REGULAR TOOTHPASTE  DENTURES WILL BE REMOVED PRIOR TO SURGERY PLEASE DO NOT APPLY "Poly grip" OR ADHESIVES!!!   Take these medicines the morning of surgery with A SIP OF WATER: Inhalers, Amlodipine, Atorvastatin, Zofran, Compazine   These are anesthesia recommendations for holding your anticoagulants.  Please contact your prescribing physician to confirm IF it is safe to hold your anticoagulants for this length of time.   Eliquis  Apixaban   72 hours   Xarelto Rivaroxaban   72 hours  Plavix Clopidogrel   120 hours  Pletal Cilostazol   120 hours                                You may not have any metal on your body including jewelry, and body piercing             Do not wear lotions, powders, cologne, or deodorant  Do not shave  48 hours prior to surgery.               Men may shave face and neck.   Do not bring valuables to the hospital. [facility] IS NOT             RESPONSIBLE   FOR VALUABLES.   Contacts, glasses, dentures or bridgework may not be worn into surgery.  DO NOT BRING YOUR HOME MEDICATIONS TO THE HOSPITAL. PHARMACY WILL DISPENSE MEDICATIONS LISTED ON YOUR MEDICATION LIST TO YOU DURING YOUR ADMISSION IN THE HOSPITAL!    Patients discharged on the day of surgery will not be allowed to drive home.  Someone NEEDS to stay with you for the first 24 hours after anesthesia.   Special Instructions: Bring a copy of your healthcare power of attorney and living will documents the day of surgery if you haven't scanned them before.              Please read over the following fact sheets you were given: IF YOU HAVE QUESTIONS ABOUT YOUR PRE-OP INSTRUCTIONS PLEASE CALL [phone] Contras    If you received a COVID test during your pre-op visit  it is requested that you wear a mask when out in public, stay away from anyone that may not be feeling well and notify your surgeon if you develop symptoms. If you test positive for Covid  or have been in contact with anyone that has tested positive in the last 10 days please notify you surgeon.    [facility] - Preparing for Surgery Before surgery, you can play an important role.  Because skin is not sterile, your skin needs to be as free of germs as possible.  You can reduce the number of germs on your skin by washing with CHG (chlorahexidine gluconate) soap before surgery.  CHG is an antiseptic cleaner which kills germs and bonds with the skin to continue killing germs even after washing. Please DO NOT use if you have an allergy to CHG or antibacterial soaps.  If your skin becomes reddened/irritated stop using the CHG and inform your nurse when you arrive at Short Stay. Do not shave (including legs and underarms) for at least 48 hours prior to the first CHG shower.  You may shave your face/neck.  Please follow these instructions carefully:  1.  Shower with CHG Soap the night before surgery and the  morning of surgery.  2.  If you choose to wash your hair, wash your hair first as usual with your normal  shampoo.  3.  After you shampoo, rinse your hair and body thoroughly to remove the shampoo.                             4.  Use CHG as you would any other liquid soap.  You can apply chg directly to the skin and wash.  Gently with a scrungie or clean washcloth.  5.  Apply the CHG Soap to your body ONLY FROM THE NECK DOWN.   Do   not use on face/ open                           Wound or open sores. Avoid contact with eyes, ears mouth and   genitals (private parts).                       Wash face,  Genitals (private parts) with your normal soap.             6.  Wash thoroughly, paying special attention to the area where your    surgery  will be performed.  7.  Thoroughly rinse your body with warm water from the neck down.  8.  DO NOT shower/wash with your normal soap after using and rinsing off the CHG Soap.                9.  Pat yourself dry with a clean towel.            10.  Wear  clean pajamas.            11.  Place clean sheets on your bed the night of your first shower and do not  sleep with pets. Day of Surgery : Do not apply any lotions/deodorants the morning of surgery.  Please wear clean clothes to the hospital/surgery center.  FAILURE TO FOLLOW THESE INSTRUCTIONS MAY RESULT IN THE CANCELLATION OF YOUR SURGERY  PATIENT SIGNATURE_________________________________  NURSE SIGNATURE__________________________________  ________________________________________________________________________

## 2022-05-01 ENCOUNTER — Other Ambulatory Visit: Payer: Self-pay

## 2022-05-01 ENCOUNTER — Other Ambulatory Visit (HOSPITAL_COMMUNITY): Payer: Self-pay

## 2022-05-01 ENCOUNTER — Encounter (HOSPITAL_COMMUNITY): Payer: Self-pay

## 2022-05-01 ENCOUNTER — Encounter (HOSPITAL_COMMUNITY)
Admission: RE | Admit: 2022-05-01 | Discharge: 2022-05-01 | Disposition: A | Discharge status: Home / Self Care | Payer: 59 | Source: Ambulatory Visit | Attending: Orthopedic Surgery | Admitting: Orthopedic Surgery

## 2022-05-01 VITALS — BP 139/84 | HR 52 | Temp 98.1°F | Resp 18 | Ht 66.5 in | Wt 171.0 lb

## 2022-05-01 DIAGNOSIS — Z8616 Personal history of COVID-19: Secondary | ICD-10-CM | POA: Insufficient documentation

## 2022-05-01 DIAGNOSIS — Z8619 Personal history of other infectious and parasitic diseases: Secondary | ICD-10-CM | POA: Insufficient documentation

## 2022-05-01 DIAGNOSIS — Z86718 Personal history of other venous thrombosis and embolism: Secondary | ICD-10-CM | POA: Diagnosis not present

## 2022-05-01 DIAGNOSIS — M25812 Other specified joint disorders, left shoulder: Secondary | ICD-10-CM | POA: Diagnosis not present

## 2022-05-01 DIAGNOSIS — I129 Hypertensive chronic kidney disease with stage 1 through stage 4 chronic kidney disease, or unspecified chronic kidney disease: Secondary | ICD-10-CM | POA: Diagnosis not present

## 2022-05-01 DIAGNOSIS — Z01812 Encounter for preprocedural laboratory examination: Secondary | ICD-10-CM | POA: Diagnosis not present

## 2022-05-01 DIAGNOSIS — J439 Emphysema, unspecified: Secondary | ICD-10-CM | POA: Diagnosis not present

## 2022-05-01 DIAGNOSIS — Z86711 Personal history of pulmonary embolism: Secondary | ICD-10-CM | POA: Insufficient documentation

## 2022-05-01 DIAGNOSIS — K759 Inflammatory liver disease, unspecified: Secondary | ICD-10-CM

## 2022-05-01 DIAGNOSIS — G4733 Obstructive sleep apnea (adult) (pediatric): Secondary | ICD-10-CM | POA: Diagnosis not present

## 2022-05-01 DIAGNOSIS — M75102 Unspecified rotator cuff tear or rupture of left shoulder, not specified as traumatic: Secondary | ICD-10-CM | POA: Diagnosis not present

## 2022-05-01 DIAGNOSIS — N189 Chronic kidney disease, unspecified: Secondary | ICD-10-CM | POA: Insufficient documentation

## 2022-05-01 DIAGNOSIS — I10 Essential (primary) hypertension: Secondary | ICD-10-CM

## 2022-05-01 HISTORY — DX: Dyspnea, unspecified: R06.00

## 2022-05-01 HISTORY — DX: Chronic kidney disease, unspecified: N18.9

## 2022-05-01 HISTORY — DX: Anemia, unspecified: D64.9

## 2022-05-01 HISTORY — DX: Pneumonia, unspecified organism: J18.9

## 2022-05-01 LAB — CBC
HCT: 40.3 % (ref 39.0–52.0)
Hemoglobin: 13.3 g/dL (ref 13.0–17.0)
MCH: 29.1 pg (ref 26.0–34.0)
MCHC: 33 g/dL (ref 30.0–36.0)
MCV: 88.2 fL (ref 80.0–100.0)
Platelets: 204 10*3/uL (ref 150–400)
RBC: 4.57 MIL/uL (ref 4.22–5.81)
RDW: 13.2 % (ref 11.5–15.5)
WBC: 6.1 10*3/uL (ref 4.0–10.5)
nRBC: 0 % (ref 0.0–0.2)

## 2022-05-01 LAB — COMPREHENSIVE METABOLIC PANEL
ALT: 13 U/L (ref 0–44)
AST: 20 U/L (ref 15–41)
Albumin: 4.1 g/dL (ref 3.5–5.0)
Alkaline Phosphatase: 57 U/L (ref 38–126)
Anion gap: 8 (ref 5–15)
BUN: 26 mg/dL — ABNORMAL HIGH (ref 8–23)
CO2: 24 mmol/L (ref 22–32)
Calcium: 9.4 mg/dL (ref 8.9–10.3)
Chloride: 108 mmol/L (ref 98–111)
Creatinine, Ser: 1.2 mg/dL (ref 0.61–1.24)
GFR, Estimated: >60 mL/min (ref >60)
Glucose, Bld: 79 mg/dL (ref 70–99)
Potassium: 3.9 mmol/L (ref 3.5–5.1)
Sodium: 140 mmol/L (ref 135–145)
Total Bilirubin: 0.7 mg/dL (ref 0.3–1.2)
Total Protein: 7 g/dL (ref 6.5–8.1)

## 2022-05-02 ENCOUNTER — Telehealth: Payer: Self-pay | Admitting: Acute Care

## 2022-05-02 DIAGNOSIS — I10 Essential (primary) hypertension: Secondary | ICD-10-CM | POA: Diagnosis not present

## 2022-05-02 DIAGNOSIS — J449 Chronic obstructive pulmonary disease, unspecified: Secondary | ICD-10-CM | POA: Diagnosis not present

## 2022-05-02 DIAGNOSIS — E7800 Pure hypercholesterolemia, unspecified: Secondary | ICD-10-CM | POA: Diagnosis not present

## 2022-05-02 NOTE — Progress Notes (Signed)
Anesthesia Chart Review   Case: [id number] Date/Time: 05/09/22 1140   Procedure: SHOULDER ARTHROSCOPY WITH ROTATOR CUFF REPAIR AND SUBACROMIAL DECOMPRESSION (Left)   Anesthesia type: Choice   Pre-op diagnosis: LEFT SHOULDER ROTATOR CUFF TEAR, IMPINGEMENT   Location: [facility] ROOM 07 / [facility]   Surgeons: Jones Broom, MD       DISCUSSION:72 y.o. former smoker with h/o HTN, OSA, PE/DVT, COPD, CKD, treated Hep C, bladder cancer, left shoulder rotator cuff tear scheduled for above procedure 05/09/2022 with Dr. Jones Broom.   Per pulmonologist 04/29/2022, "The features of this patient's history that contribute to the pulmonary risk assessment include: Age, COPD, General anesthesia, This patient has a low risk of post-operative pulmonary complications by ARISCAT Index.  The absolute assessment of risk/benefit of the procedure is deferred to the primary team's evaluation. - Patient's Estimated risk of postoperative respiratory failure is 1.6% based on the ARISCAT Index.  0 to 25 points: Low risk: 1.6% pulmonary complication rate  26 to 44 points: Intermediate risk: 13.3% pulmonary complication rate  45 to 123 points: High risk: 42.1% pulmonary complication rate Postoperative respiratory failure (PRF) is considered as failure to wean from mechanical ventilation within 48 hours of surgery or unplanned intubation/reintubation postoperatively. The validated risk calculator provides a risk estimate of PRF and is anticipated to aid in surgical decision-making and informed patient consent.  However risk can be accepted given the potential benefit of this intervention and it is not prohibitive."  Pt with no instructions regarding Eliquis. Discussed with Dr. Veda Canning office.    VS: BP 139/84   Pulse (!) 52   Temp 36.7 C (Oral)   Resp 18   Ht 5' 6.5" (1.689 m)   Wt 77.6 kg   SpO2 97%   BMI 27.19 kg/m   PROVIDERS: Wilfrid Lund, PA is PCP   Charlott Holler, MD is Pulmonologist  LABS: Labs  reviewed: Acceptable for surgery. (all labs ordered are listed, but only abnormal results are displayed)  Labs Reviewed  COMPREHENSIVE METABOLIC PANEL - Abnormal; Notable for the following components:      Result Value   BUN 26 (*)    All other components within normal limits  CBC     IMAGES:   EKG:   CV: Echo 05/16/2021 1. Left ventricular ejection fraction, by estimation, is 60 to 65%. The  left ventricle has normal function. The left ventricle has no regional  wall motion abnormalities. Left ventricular diastolic parameters were  normal. The average left ventricular  global longitudinal strain is -23.2 %. The global longitudinal strain is  normal.   2. Right ventricular systolic function is normal. The right ventricular  size is normal. There is normal pulmonary artery systolic pressure.   3. The mitral valve is normal in structure. No evidence of mitral valve  regurgitation. No evidence of mitral stenosis.   4. The aortic valve is normal in structure. Aortic valve regurgitation is  not visualized. No aortic stenosis is present.   5. Aortic dilatation noted. There is mild dilatation of the aortic root,  measuring 38 mm.   6. The inferior vena cava is normal in size with greater than 50%  respiratory variability, suggesting right atrial pressure of 3 mmHg.  Past Medical History:  Diagnosis Date   Anemia    Arthritis    Asthma-COPD overlap syndrome    pulmonology--- dr n desai;   last exacerbation 05-08-2021   Benign localized prostatic hyperplasia with lower urinary tract symptoms (LUTS)  Chronic cough    due to copd   Chronic kidney disease    Dairy product intolerance    DOE (dyspnea on exertion)    d/t asthma/ COPD   Dyspnea    Emphysema, unspecified    Full dentures    History of bladder cancer 2020   s/p TURBT w/ chemo instillation   History of COVID-19    per pt 2021 (no at same time of pneumonia)  w/ moderate symptoms , recovered at home, that resolved  back to baseline   History of hepatitis C 2018   hx chronic hepatitis C,  per pt treated and told was cured   History of latent tuberculosis 2017   pt tested positive for TB exposure, not active, treated w/ INH for 9 months   History of MAI infection 2021   never treated   History of pulmonary embolism    (05-09-2021  pt stated never had clot prior to 2017 , none since 2020, had negative blood disorder work-up)//  2017  treated w/ xarelto 6 months//  and recurrent 04/ 2020 in setting severe UTI and deconditioning w/ prolonged admission due to intolerant to anticoagulants IVC filter inserted   Hypertension    OSA (obstructive sleep apnea)    per pt dx yrs ago, intolerant to cpap   Pneumonia    Pulmonary eosinophilia    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   Weak urinary stream    Wears glasses     Past Surgical History:  Procedure Laterality Date   ANAL FISTULECTOMY     x5  last one 2008   BRONCHOSCOPY  2021   per pt had pneumonia   (done in [place])   INGUINAL HERNIA REPAIR Right 2010   IVC FILTER INSERTION  2020   in [place]   ORIF WRIST FRACTURE Left 2016   SHOULDER ARTHROSCOPY W/ ROTATOR CUFF REPAIR Left 2016   TRANSURETHRAL RESECTION OF BLADDER TUMOR  2020   in [place]   TRANSURETHRAL RESECTION OF PROSTATE  2021   in [place]   TRANSURETHRAL RESECTION OF PROSTATE N/A 05/11/2021   Procedure: PROSTATE EXAM UNDER ANESTHESIA; TRANSURETHRAL RESECTION OF THE PROSTATE (TURP), BLADDER BIOPSY;  Surgeon: Jerilee Field, MD;  Location: [facility];  Service: Urology;  Laterality: N/A;   UMBILICAL HERNIA REPAIR  2011    MEDICATIONS:  ADVAIR HFA 230-21 MCG/ACT inhaler   albuterol (PROVENTIL) (2.5 MG/3ML) 0.083% nebulizer solution   albuterol (VENTOLIN HFA) 108 (90 Base) MCG/ACT inhaler   amLODipine (NORVASC) 10 MG tablet   atorvastatin (LIPITOR) 20 MG tablet   Dupilumab (DUPIXENT) 300 MG/2ML SOPN   ELIQUIS 5 MG TABS tablet   guaiFENesin (MUCINEX) 600 MG 12 hr tablet   HYDROcodone  bit-homatropine (HYCODAN) 5-1.5 MG/5ML syrup   Misc. Devices MISC   ondansetron (ZOFRAN-ODT) 4 MG disintegrating tablet   prochlorperazine (COMPAZINE) 10 MG tablet   sodium chloride 0.9 % nebulizer solution   Tiotropium Bromide Monohydrate (SPIRIVA RESPIMAT) 2.5 MCG/ACT AERS   No current facility-administered medications for this encounter.   Jodell Cipro Ward, PA-C [facility] [phone]

## 2022-05-02 NOTE — Telephone Encounter (Signed)
Pt is taking elliquis and needs to know when to hold off on it before his surgery

## 2022-05-02 NOTE — Telephone Encounter (Signed)
Called and spoke to patient. He stated that he is scheduled for shoulder surgery on 05/09/2022. He is questioning how long he should long Eliquis.   Please advise. Thanks

## 2022-05-02 NOTE — Telephone Encounter (Signed)
Usually 24 hours but would confirm with his surgical team

## 2022-05-03 NOTE — Telephone Encounter (Signed)
Spoke with patient. Advised patient to contact surgical team in regards to holding eliquis. He stated they told him 72 hours. I told pt to follow what they have told him. He verbalized understanding.NFN

## 2022-05-03 NOTE — Telephone Encounter (Signed)
ATC X1 LVM for patient to call the office back 

## 2022-05-05 ENCOUNTER — Other Ambulatory Visit: Payer: Self-pay | Admitting: Nurse Practitioner

## 2022-05-05 DIAGNOSIS — J4489 Other specified chronic obstructive pulmonary disease: Secondary | ICD-10-CM

## 2022-05-09 ENCOUNTER — Ambulatory Visit (HOSPITAL_BASED_OUTPATIENT_CLINIC_OR_DEPARTMENT_OTHER): Payer: 59 | Admitting: Anesthesiology

## 2022-05-09 ENCOUNTER — Encounter (HOSPITAL_COMMUNITY)
Admission: RE | Disposition: A | Discharge status: Home / Self Care | Payer: Self-pay | Source: Home / Self Care | Attending: Orthopedic Surgery

## 2022-05-09 ENCOUNTER — Ambulatory Visit (HOSPITAL_COMMUNITY)
Admission: RE | Admit: 2022-05-09 | Discharge: 2022-05-09 | Disposition: A | Discharge status: Home / Self Care | Payer: 59 | Attending: Orthopedic Surgery | Admitting: Orthopedic Surgery

## 2022-05-09 ENCOUNTER — Encounter (HOSPITAL_COMMUNITY): Payer: Self-pay | Admitting: Orthopedic Surgery

## 2022-05-09 ENCOUNTER — Ambulatory Visit (HOSPITAL_COMMUNITY): Payer: 59 | Admitting: Physician Assistant

## 2022-05-09 ENCOUNTER — Other Ambulatory Visit: Payer: Self-pay

## 2022-05-09 DIAGNOSIS — M75122 Complete rotator cuff tear or rupture of left shoulder, not specified as traumatic: Secondary | ICD-10-CM | POA: Diagnosis not present

## 2022-05-09 DIAGNOSIS — I10 Essential (primary) hypertension: Secondary | ICD-10-CM

## 2022-05-09 DIAGNOSIS — I129 Hypertensive chronic kidney disease with stage 1 through stage 4 chronic kidney disease, or unspecified chronic kidney disease: Secondary | ICD-10-CM | POA: Insufficient documentation

## 2022-05-09 DIAGNOSIS — M25812 Other specified joint disorders, left shoulder: Secondary | ICD-10-CM | POA: Diagnosis not present

## 2022-05-09 DIAGNOSIS — Z8551 Personal history of malignant neoplasm of bladder: Secondary | ICD-10-CM | POA: Insufficient documentation

## 2022-05-09 DIAGNOSIS — K759 Inflammatory liver disease, unspecified: Secondary | ICD-10-CM

## 2022-05-09 DIAGNOSIS — Z9221 Personal history of antineoplastic chemotherapy: Secondary | ICD-10-CM | POA: Diagnosis not present

## 2022-05-09 DIAGNOSIS — E785 Hyperlipidemia, unspecified: Secondary | ICD-10-CM | POA: Insufficient documentation

## 2022-05-09 DIAGNOSIS — Z79899 Other long term (current) drug therapy: Secondary | ICD-10-CM | POA: Diagnosis not present

## 2022-05-09 DIAGNOSIS — G4733 Obstructive sleep apnea (adult) (pediatric): Secondary | ICD-10-CM | POA: Diagnosis not present

## 2022-05-09 DIAGNOSIS — Z87891 Personal history of nicotine dependence: Secondary | ICD-10-CM | POA: Diagnosis not present

## 2022-05-09 DIAGNOSIS — Z7901 Long term (current) use of anticoagulants: Secondary | ICD-10-CM | POA: Diagnosis not present

## 2022-05-09 DIAGNOSIS — F1290 Cannabis use, unspecified, uncomplicated: Secondary | ICD-10-CM | POA: Diagnosis not present

## 2022-05-09 DIAGNOSIS — Z8619 Personal history of other infectious and parasitic diseases: Secondary | ICD-10-CM | POA: Insufficient documentation

## 2022-05-09 DIAGNOSIS — M75102 Unspecified rotator cuff tear or rupture of left shoulder, not specified as traumatic: Secondary | ICD-10-CM | POA: Diagnosis not present

## 2022-05-09 DIAGNOSIS — N189 Chronic kidney disease, unspecified: Secondary | ICD-10-CM | POA: Insufficient documentation

## 2022-05-09 DIAGNOSIS — J439 Emphysema, unspecified: Secondary | ICD-10-CM | POA: Diagnosis not present

## 2022-05-09 DIAGNOSIS — Z7951 Long term (current) use of inhaled steroids: Secondary | ICD-10-CM | POA: Diagnosis not present

## 2022-05-09 DIAGNOSIS — J189 Pneumonia, unspecified organism: Secondary | ICD-10-CM | POA: Diagnosis not present

## 2022-05-09 DIAGNOSIS — G8918 Other acute postprocedural pain: Secondary | ICD-10-CM | POA: Diagnosis not present

## 2022-05-09 DIAGNOSIS — M7542 Impingement syndrome of left shoulder: Secondary | ICD-10-CM

## 2022-05-09 DIAGNOSIS — Z86711 Personal history of pulmonary embolism: Secondary | ICD-10-CM | POA: Diagnosis not present

## 2022-05-09 HISTORY — PX: SHOULDER ARTHROSCOPY WITH ROTATOR CUFF REPAIR AND SUBACROMIAL DECOMPRESSION: SHX5686

## 2022-05-09 SURGERY — SHOULDER ARTHROSCOPY WITH ROTATOR CUFF REPAIR AND SUBACROMIAL DECOMPRESSION
Anesthesia: General | Laterality: Left

## 2022-05-09 MED ORDER — CEFAZOLIN SODIUM-DEXTROSE 2-4 GM/100ML-% IV SOLN
2.0000 g | INTRAVENOUS | Status: AC
  Administered 2022-05-09: 2 g via INTRAVENOUS
  Filled 2022-05-09: qty 100

## 2022-05-09 MED ORDER — HYDROCODONE-ACETAMINOPHEN 5-325 MG PO TABS: 1.0000 | ORAL_TABLET | ORAL | 0 refills | Status: AC | PRN

## 2022-05-09 MED ORDER — FENTANYL CITRATE (PF) 100 MCG/2ML IJ SOLN
INTRAMUSCULAR | Status: AC
  Filled 2022-05-09: qty 2

## 2022-05-09 MED ORDER — DEXAMETHASONE SODIUM PHOSPHATE 10 MG/ML IJ SOLN
INTRAMUSCULAR | Status: DC | PRN
  Administered 2022-05-09: 4 mg via INTRAVENOUS

## 2022-05-09 MED ORDER — SODIUM CHLORIDE 0.9 % IR SOLN
Status: DC | PRN
  Administered 2022-05-09: 6000 mL
  Administered 2022-05-09: 3000 mL

## 2022-05-09 MED ORDER — PHENYLEPHRINE HCL-NACL 20-0.9 MG/250ML-% IV SOLN
INTRAVENOUS | Status: DC | PRN
  Administered 2022-05-09: 50 ug/min via INTRAVENOUS

## 2022-05-09 MED ORDER — FENTANYL CITRATE PF 50 MCG/ML IJ SOSY
50.0000 ug | PREFILLED_SYRINGE | INTRAMUSCULAR | Status: AC
  Administered 2022-05-09: 50 ug via INTRAVENOUS
  Filled 2022-05-09: qty 2

## 2022-05-09 MED ORDER — OXYCODONE HCL 5 MG PO TABS: 5.0000 mg | ORAL_TABLET | Freq: Once | ORAL | Status: DC | PRN

## 2022-05-09 MED ORDER — GLYCOPYRROLATE 0.2 MG/ML IJ SOLN
INTRAMUSCULAR | Status: DC | PRN
  Administered 2022-05-09: .2 mg via INTRAVENOUS

## 2022-05-09 MED ORDER — BUPIVACAINE HCL (PF) 0.5 % IJ SOLN
INTRAMUSCULAR | Status: DC | PRN
  Administered 2022-05-09: 15 mL via PERINEURAL

## 2022-05-09 MED ORDER — PROPOFOL 10 MG/ML IV BOLUS
INTRAVENOUS | Status: DC | PRN
  Administered 2022-05-09: 130 mg via INTRAVENOUS

## 2022-05-09 MED ORDER — MIDAZOLAM HCL 2 MG/2ML IJ SOLN: 1.0000 mg | INTRAMUSCULAR | Status: DC

## 2022-05-09 MED ORDER — FENTANYL CITRATE (PF) 100 MCG/2ML IJ SOLN
INTRAMUSCULAR | Status: DC | PRN
  Administered 2022-05-09 (×2): 50 ug via INTRAVENOUS

## 2022-05-09 MED ORDER — OXYCODONE HCL 5 MG/5ML PO SOLN: 5.0000 mg | Freq: Once | ORAL | Status: DC | PRN

## 2022-05-09 MED ORDER — LACTATED RINGERS IV SOLN: INTRAVENOUS | Status: DC

## 2022-05-09 MED ORDER — 0.9 % SODIUM CHLORIDE (POUR BTL) OPTIME
TOPICAL | Status: DC | PRN
  Administered 2022-05-09: 1000 mL

## 2022-05-09 MED ORDER — LIDOCAINE HCL (CARDIAC) PF 100 MG/5ML IV SOSY
PREFILLED_SYRINGE | INTRAVENOUS | Status: DC | PRN
  Administered 2022-05-09: 100 mg via INTRAVENOUS

## 2022-05-09 MED ORDER — ORAL CARE MOUTH RINSE: 15.0000 mL | Freq: Once | OROMUCOSAL | Status: AC

## 2022-05-09 MED ORDER — ONDANSETRON HCL 4 MG/2ML IJ SOLN
INTRAMUSCULAR | Status: AC
  Filled 2022-05-09: qty 2

## 2022-05-09 MED ORDER — ROCURONIUM BROMIDE 10 MG/ML (PF) SYRINGE
PREFILLED_SYRINGE | INTRAVENOUS | Status: AC
  Filled 2022-05-09: qty 10

## 2022-05-09 MED ORDER — BUPIVACAINE LIPOSOME 1.3 % IJ SUSP
INTRAMUSCULAR | Status: DC | PRN
  Administered 2022-05-09: 10 mL via PERINEURAL

## 2022-05-09 MED ORDER — FENTANYL CITRATE PF 50 MCG/ML IJ SOSY: 25.0000 ug | PREFILLED_SYRINGE | INTRAMUSCULAR | Status: DC | PRN

## 2022-05-09 MED ORDER — CHLORHEXIDINE GLUCONATE 0.12 % MT SOLN
15.0000 mL | Freq: Once | OROMUCOSAL | Status: AC
  Administered 2022-05-09: 15 mL via OROMUCOSAL

## 2022-05-09 MED ORDER — EPHEDRINE SULFATE (PRESSORS) 50 MG/ML IJ SOLN
INTRAMUSCULAR | Status: DC | PRN
  Administered 2022-05-09: 10 mg via INTRAVENOUS
  Administered 2022-05-09: 5 mg via INTRAVENOUS

## 2022-05-09 MED ORDER — BUPIVACAINE LIPOSOME 1.3 % IJ SUSP: INTRAMUSCULAR | Status: DC | PRN

## 2022-05-09 MED ORDER — SUGAMMADEX SODIUM 500 MG/5ML IV SOLN
INTRAVENOUS | Status: DC | PRN
  Administered 2022-05-09: 400 mg via INTRAVENOUS

## 2022-05-09 MED ORDER — PROPOFOL 10 MG/ML IV BOLUS
INTRAVENOUS | Status: AC
  Filled 2022-05-09: qty 20

## 2022-05-09 MED ORDER — LIDOCAINE HCL (PF) 2 % IJ SOLN
INTRAMUSCULAR | Status: AC
  Filled 2022-05-09: qty 5

## 2022-05-09 MED ORDER — ONDANSETRON HCL 4 MG/2ML IJ SOLN
4.0000 mg | Freq: Once | INTRAMUSCULAR | Status: AC | PRN
  Administered 2022-05-09: 4 mg via INTRAVENOUS

## 2022-05-09 MED ORDER — ROCURONIUM BROMIDE 100 MG/10ML IV SOLN
INTRAVENOUS | Status: DC | PRN
  Administered 2022-05-09: 60 mg via INTRAVENOUS

## 2022-05-09 SURGICAL SUPPLY — 60 items
AID PSTN UNV HD RSTRNT DISP (MISCELLANEOUS) ×1
ANCH SUT SWLK 19.1X4.75 VT (Anchor) ×2 IMPLANT
ANCHOR PEEK 4.75X19.1 SWLK C (Anchor) IMPLANT
ANCHOR SVLK SP PEEK 4.75BLUE#2 (Anchor) IMPLANT
BOOTIES KNEE HIGH SLOAN (MISCELLANEOUS) ×2 IMPLANT
BURR OVAL 8 FLU 4.0X13 (MISCELLANEOUS) ×1 IMPLANT
CANNULA 5.75X7 CRYSTAL CLEAR (CANNULA) ×1 IMPLANT
CANNULA TWIST IN 8.25X7CM (CANNULA) IMPLANT
COOLER ICEMAN CLASSIC (MISCELLANEOUS) IMPLANT
COVER SURGICAL LIGHT HANDLE (MISCELLANEOUS) ×1 IMPLANT
CUTTER BONE 4.0MM X 13CM (MISCELLANEOUS) ×1 IMPLANT
DISSECTOR  3.8MM X 13CM (MISCELLANEOUS)
DISSECTOR 3.8MM X 13CM (MISCELLANEOUS) IMPLANT
DRAPE IMP U-DRAPE 54X76 (DRAPES) ×1 IMPLANT
DRAPE INCISE IOBAN 66X45 STRL (DRAPES) IMPLANT
DRAPE ORTHO SPLIT 77X108 STRL (DRAPES) ×2
DRAPE STERI 35X30 U-POUCH (DRAPES) ×1 IMPLANT
DRAPE SURG ORHT 6 SPLT 77X108 (DRAPES) ×2 IMPLANT
DRAPE U-SHAPE 47X51 STRL (DRAPES) ×2 IMPLANT
DURAPREP 26ML APPLICATOR (WOUND CARE) ×1 IMPLANT
ELECT REM PT RETURN 15FT ADLT (MISCELLANEOUS) ×1 IMPLANT
FIBER TAPE 2MM (SUTURE) IMPLANT
GAUZE PAD ABD 8X10 STRL (GAUZE/BANDAGES/DRESSINGS) ×2 IMPLANT
GAUZE SPONGE 4X4 12PLY STRL (GAUZE/BANDAGES/DRESSINGS) ×1 IMPLANT
GAUZE XEROFORM 1X8 LF (GAUZE/BANDAGES/DRESSINGS) ×1 IMPLANT
GLOVE BIO SURGEON STRL SZ7.5 (GLOVE) ×1 IMPLANT
GLOVE BIOGEL PI IND STRL 6.5 (GLOVE) ×1 IMPLANT
GLOVE BIOGEL PI IND STRL 8 (GLOVE) ×1 IMPLANT
GLOVE SURG POLYISO LF SZ6.5 (GLOVE) ×1 IMPLANT
GOWN STRL REUS W/ TWL LRG LVL3 (GOWN DISPOSABLE) ×1 IMPLANT
GOWN STRL REUS W/ TWL XL LVL3 (GOWN DISPOSABLE) ×1 IMPLANT
GOWN STRL REUS W/TWL LRG LVL3 (GOWN DISPOSABLE) ×1
GOWN STRL REUS W/TWL XL LVL3 (GOWN DISPOSABLE) ×1
KIT BASIN OR (CUSTOM PROCEDURE TRAY) ×1 IMPLANT
KIT PUSHLOCK 2.9 HIP (KITS) IMPLANT
KIT TURNOVER KIT A (KITS) IMPLANT
LASSO 90 CVE QUICKPAS (DISPOSABLE) IMPLANT
LASSO CRESCENT QUICKPASS (SUTURE) IMPLANT
MANIFOLD NEPTUNE II (INSTRUMENTS) ×1 IMPLANT
NDL HD SCORPION MEGA LOADER (NEEDLE) IMPLANT
NDL SCORPION MULTI FIRE (NEEDLE) IMPLANT
NEEDLE SCORPION MULTI FIRE (NEEDLE) IMPLANT
PACK ARTHROSCOPY WL (CUSTOM PROCEDURE TRAY) ×1 IMPLANT
PAD COLD SHLDR WRAP-ON (PAD) IMPLANT
PROBE BIPOLAR ATHRO 135MM 90D (MISCELLANEOUS) ×1 IMPLANT
RESTRAINT HEAD UNIVERSAL NS (MISCELLANEOUS) IMPLANT
SLING ARM FOAM STRAP LRG (SOFTGOODS) IMPLANT
SUPPORT WRAP ARM LG (MISCELLANEOUS) ×1 IMPLANT
SUT ETHILON 3 0 PS 1 (SUTURE) ×1 IMPLANT
SUT PDS AB 1 CT1 27 (SUTURE) IMPLANT
SUT TIGER TAPE 7 IN WHITE (SUTURE) IMPLANT
SUTURE TAPE 1.3 40 TPR END (SUTURE) IMPLANT
SUTURETAPE 1.3 40 TPR END (SUTURE)
TAPE CLOTH SURG 6X10 WHT LF (GAUZE/BANDAGES/DRESSINGS) IMPLANT
TAPE FIBER 2MM 7IN #2 BLUE (SUTURE) IMPLANT
TOWEL OR 17X26 10 PK STRL BLUE (TOWEL DISPOSABLE) ×1 IMPLANT
TOWEL OR NON WOVEN STRL DISP B (DISPOSABLE) ×1 IMPLANT
TUBING ARTHROSCOPY IRRIG 16FT (MISCELLANEOUS) ×1 IMPLANT
TUBING CONNECTING 10 (TUBING) ×2 IMPLANT
WAND ABLATOR APOLLO I90 (BUR) IMPLANT

## 2022-05-09 NOTE — Transfer of Care (Signed)
Immediate Anesthesia Transfer of Care Note  Patient: James Barker  Procedure(s) Performed: SHOULDER ARTHROSCOPY WITH ROTATOR CUFF REPAIR AND SUBACROMIAL DECOMPRESSION (Left)  Patient Location: PACU  Anesthesia Type:General  Level of Consciousness: awake, alert , oriented, and patient cooperative  Airway & Oxygen Therapy: Patient Spontanous Breathing and Patient connected to face mask oxygen  Post-op Assessment: Report given to RN, Post -op Vital signs reviewed and stable, and Patient moving all extremities X 4  Post vital signs: Reviewed and stable  Last Vitals:  Vitals Value Taken Time  BP 139/90 05/09/22 1326  Temp    Pulse 63 05/09/22 1327  Resp 20 05/09/22 1327  SpO2 100 % 05/09/22 1327  Vitals shown include unvalidated device data.  Last Pain:  Vitals:   05/09/22 1010  TempSrc:   PainSc: 0-No pain      Patients Stated Pain Goal: 4 (05/09/22 1010)  Complications: No notable events documented.

## 2022-05-09 NOTE — Anesthesia Preprocedure Evaluation (Signed)
Anesthesia Evaluation  Patient identified by MRN, date of birth, ID band Patient awake    Reviewed: Allergy & Precautions, NPO status , Patient's Chart, lab work & pertinent test results, reviewed documented beta blocker date and time   Airway Mallampati: I  TM Distance: >3 FB Neck ROM: Full    Dental  (+) Upper Dentures, Lower Dentures   Pulmonary shortness of breath and with exertion, asthma , sleep apnea , pneumonia, resolved, COPD,  COPD inhaler, former smoker, PE   breath sounds clear to auscultation + decreased breath sounds      Cardiovascular hypertension, Pt. on medications + DOE   Rhythm:Regular Rate:Bradycardia  Hx/o IVC filter insertion   Neuro/Psych negative neurological ROS  negative psych ROS   GI/Hepatic negative GI ROS,,,(+) Hepatitis -, C  Endo/Other  Hyperlipidemia  Renal/GU Renal InsufficiencyRenal disease   Hx/o Bladder Ca S/P TURBT and ChemoRx    Musculoskeletal  (+) Arthritis , Osteoarthritis,  Left shoulder torn rotator cuff Impingement syndrome left shoulder   Abdominal   Peds  Hematology  (+) Blood dyscrasia, anemia Eliquis therapy- last dose 4/14 pm    Anesthesia Other Findings   Reproductive/Obstetrics                              Anesthesia Physical Anesthesia Plan  ASA: 3  Anesthesia Plan: General   Post-op Pain Management: Regional block* and Minimal or no pain anticipated   Induction: Intravenous  PONV Risk Score and Plan: 3 and Treatment may vary due to age or medical condition and Ondansetron  Airway Management Planned: Oral ETT  Additional Equipment:   Intra-op Plan:   Post-operative Plan: Extubation in OR  Informed Consent: I have reviewed the patients History and Physical, chart, labs and discussed the procedure including the risks, benefits and alternatives for the proposed anesthesia with the patient or authorized representative who  has indicated his/her understanding and acceptance.     Dental advisory given  Plan Discussed with: Anesthesiologist and CRNA  Anesthesia Plan Comments:          Anesthesia Quick Evaluation

## 2022-05-09 NOTE — Op Note (Signed)
Procedure(s): SHOULDER ARTHROSCOPY WITH ROTATOR CUFF REPAIR AND SUBACROMIAL DECOMPRESSION Procedure Note  James Barker male 72 y.o. 05/09/2022  Preoperative diagnosis:  #1 left shoulder symptomatic full-thickness rotator cuff tear #2 left shoulder impingement  Postoperative diagnosis: Same  Procedure(s) and Anesthesia Type:    * SHOULDER ARTHROSCOPY WITH ROTATOR CUFF REPAIR AND SUBACROMIAL DECOMPRESSION - General  Surgeon(s) and Role:    Jones Broom, MD - Primary     Surgeon: Glennon Hamilton   Assistants: Fredia Sorrow PA-C James Barker was present and scrubbed throughout the procedure and was essential in positioning, assisting with the camera and instrumentation,, and closure)  Anesthesia: General endotracheal anesthesia with preoperative interscalene block given by the attending anesthesiologist    Procedure Detail  SHOULDER ARTHROSCOPY WITH ROTATOR CUFF REPAIR AND SUBACROMIAL DECOMPRESSION  Estimated Blood Loss: Min         Drains: none  Blood Given: none         Specimens: none        Complications:  * No complications entered in OR log *         Disposition: PACU - hemodynamically stable.         Condition: stable    Procedure:   INDICATIONS FOR SURGERY: The patient is 72 y.o. male who has a history of left shoulder pain which has been refractory to nonoperative management.  He was found on MRI to have a full-thickness rotator cuff tear.  Indicated for surgical treatment to try and decrease pain and improve function.  OPERATIVE FINDINGS: Examination under anesthesia: No significant stiffness or instability  DESCRIPTION OF PROCEDURE: The patient was identified in preoperative  holding area where I personally marked the operative site after  verifying site, side, and procedure with the patient. An interscalene block was given by the attending anesthesiologist the holding area.  The patient was taken back to the operating room where general  anesthesia was induced without complication and was placed in the beach-chair position with the back  elevated about 60 degrees and all extremities and head and neck carefully padded and  positioned.   The left upper extremity was then prepped and  draped in a standard sterile fashion. The appropriate time-out  procedure was carried out. The patient did receive IV antibiotics  within 30 minutes of incision.   A small posterior portal incision was made and the arthroscope was introduced into the joint. An anterior portal was then established above the subscapularis using needle localization. Small cannula was placed anteriorly. Diagnostic arthroscopy was then carried out.    He was noted to have significant degenerative tearing of the anterior labrum where previous suture anchors were noted.  This was extensively debrided including removing some suture material.  The glenoid cartilage was intact however on the posterior central aspect of the humeral head he had a large area of grade IV chondromalacia.  This was lightly debrided.  The undersurface of the supraspinatus was noted to have full-thickness tearing.  The infraspinatus and subscapularis were intact.  The arthroscope was then introduced into the subacromial space a standard lateral portal was established with needle localization. The shaver was used through the lateral portal to perform extensive bursectomy.   The bursal side of the rotator cuff tear was identified.  Tendon quality was poor and thin.  It was debrided back to healthier tendon however the entire tendon was of fairly poor quality.  It was easily mobilized I did feel that repair was indicated.  Large cannula was  placed laterally and the camera was moved to a posterolateral viewing portal.  The tuberosity was then debrided down to a bleeding bony surface to promote healing.  The repair was then carried out by first placing 2 4.75 peek swivel lock anchors just off the articular margin  anterior and posterior.  These were preloaded with fiber tape.  These 4 suture strands were then passed evenly throughout the tear and brought over to 2 additional anchors.  The knotless mechanism from the lateral row anchors were used for additional fixation.  The repair was felt to be smooth and watertight and without significant tension.  The coracoacromial ligament was taken down off the anterior acromion with the ArthroCare exposing a moderate anterior acromial spur. A high-speed bur was then used through the lateral portal to take down the anterior acromial spur from lateral to medial in a standard acromioplasty.  The acromioplasty was also viewed from the lateral portal and the bur was used as necessary to ensure that the acromion was completely flat from posterior to anterior.  The arthroscopic equipment was removed from the joint and the portals were closed with 3-0 nylon in an interrupted fashion. Sterile dressings were then applied including Xeroform 4 x 4's ABDs and tape. The patient was then allowed to awaken from general anesthesia, placed in a sling, transferred to the stretcher and taken to the recovery room in stable condition.   POSTOPERATIVE PLAN: The patient will be discharged home today and will followup in one week for suture removal and wound check.  We will recover him slowly given the poor tendon quality.

## 2022-05-09 NOTE — Anesthesia Postprocedure Evaluation (Signed)
Anesthesia Post Note  Patient: JEFFREY GRAEFE  Procedure(s) Performed: SHOULDER ARTHROSCOPY WITH ROTATOR CUFF REPAIR AND SUBACROMIAL DECOMPRESSION (Left)     Patient location during evaluation: PACU Anesthesia Type: General Level of consciousness: awake and alert and oriented Pain management: pain level controlled Vital Signs Assessment: post-procedure vital signs reviewed and stable Respiratory status: spontaneous breathing, nonlabored ventilation and respiratory function stable Cardiovascular status: blood pressure returned to baseline and stable Postop Assessment: no apparent nausea or vomiting Anesthetic complications: no   No notable events documented.  Last Vitals:  Vitals:   05/09/22 1330 05/09/22 1345  BP: 130/78 (!) 138/94  Pulse: 62 63  Resp: 15 17  Temp:  (!) 36.2 C  SpO2: 100% 98%    Last Pain:  Vitals:   05/09/22 1400  TempSrc:   PainSc: 0-No pain                 Deztiny Sarra,Early A.

## 2022-05-09 NOTE — Anesthesia Procedure Notes (Signed)
Anesthesia Regional Block: Interscalene brachial plexus block   Pre-Anesthetic Checklist: , timeout performed,  Correct Patient, Correct Site, Correct Laterality,  Correct Procedure, Correct Position, site marked,  Risks and benefits discussed,  Surgical consent,  Pre-op evaluation,  At surgeon's request and post-op pain management  Laterality: Left  Prep: chloraprep       Needles:  Injection technique: Single-shot  Needle Type: Echogenic Stimulator Needle     Needle Length: 10cm  Needle Gauge: 21   Needle insertion depth: 6 cm   Additional Needles:   Procedures:,,,, ultrasound used (permanent image in chart),,   Motor weakness within 3 minutes.  Narrative:  Start time: 05/09/2022 11:32 AM End time: 05/09/2022 11:37 AM Injection made incrementally with aspirations every 5 mL.  Performed by: Personally  Anesthesiologist: Mal Amabile, MD  Additional Notes: Timeout performed. Patient sedated. Relevant anatomy ID'd using [place]. Incremental 2-34ml injection of LA with frequent aspiration. Patient tolerated procedure well.

## 2022-05-09 NOTE — Discharge Instructions (Addendum)
Discharge Instructions after Arthroscopic Shoulder Repair   A sling has been provided for you. Remain in your sling at all times. This includes sleeping in your sling.  Use ice on the shoulder intermittently over the first 48 hours after surgery.  You are not cleared to drive until seen back in the office Pain medicine has been prescribed for you.  Use your medicine liberally over the first 48 hours, and then you can begin to taper your use. You may take Extra Strength Tylenol or Tylenol only in place of the pain pills. DO NOT take ANY nonsteroidal anti-inflammatory pain medications: Advil, Motrin, Ibuprofen, Aleve, Naproxen, or Narprosyn.  You may remove your dressing after two days. If the incision sites are still moist, place a Band-Aid over the moist site(s). Change Band-Aids daily until dry.  You may shower 5 days after surgery. The incisions CANNOT get wet prior to 5 days. Simply allow the water to wash over the site and then pat dry. Do not rub the incisions. Make sure your axilla (armpit) is completely dry after showering.  Take one aspirin  a day for 2 weeks after surgery, unless you have an aspirin sensitivity/ allergy or asthma.   Please call [phone] during normal business hours or [phone] after hours for any problems. Including the following:  - excessive redness of the incisions - drainage for more than 4 days - fever of more than 101.5 F  *Please note that pain medications will not be refilled after hours or on weekends.

## 2022-05-09 NOTE — H&P (Signed)
James Barker is an 72 y.o. male.   Chief Complaint: L shoulder pain  HPI: L shoulder pain with small full thickness RCT, failed conservative treatment.  Past Medical History:  Diagnosis Date   Anemia    Arthritis    Asthma-COPD overlap syndrome    pulmonology--- dr n desai;   last exacerbation 05-08-2021   Benign localized prostatic hyperplasia with lower urinary tract symptoms (LUTS)    Chronic cough    due to copd   Chronic kidney disease    Dairy product intolerance    DOE (dyspnea on exertion)    d/t asthma/ COPD   Dyspnea    Emphysema, unspecified    Full dentures    History of bladder cancer 2020   s/p TURBT w/ chemo instillation   History of COVID-19    per pt 2021 (no at same time of pneumonia)  w/ moderate symptoms , recovered at home, that resolved back to baseline   History of hepatitis C 2018   hx chronic hepatitis C,  per pt treated and told was cured   History of latent tuberculosis 2017   pt tested positive for TB exposure, not active, treated w/ INH for 9 months   History of MAI infection 2021   never treated   History of pulmonary embolism    (05-09-2021  pt stated never had clot prior to 2017 , none since 2020, had negative blood disorder work-up)//  2017  treated w/ xarelto 6 months//  and recurrent 04/ 2020 in setting severe UTI and deconditioning w/ prolonged admission due to intolerant to anticoagulants IVC filter inserted   Hypertension    OSA (obstructive sleep apnea)    per pt dx yrs ago, intolerant to cpap   Pneumonia    Pulmonary eosinophilia    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   Weak urinary stream    Wears glasses     Past Surgical History:  Procedure Laterality Date   ANAL FISTULECTOMY     x5  last one 2008   BRONCHOSCOPY  2021   per pt had pneumonia   (done in [place])   INGUINAL HERNIA REPAIR Right 2010   IVC FILTER INSERTION  2020   in [place]   ORIF WRIST FRACTURE Left 2016   SHOULDER ARTHROSCOPY W/ ROTATOR CUFF REPAIR Left 2016    TRANSURETHRAL RESECTION OF BLADDER TUMOR  2020   in [place]   TRANSURETHRAL RESECTION OF PROSTATE  2021   in [place]   TRANSURETHRAL RESECTION OF PROSTATE N/A 05/11/2021   Procedure: PROSTATE EXAM UNDER ANESTHESIA; TRANSURETHRAL RESECTION OF THE PROSTATE (TURP), BLADDER BIOPSY;  Surgeon: Jerilee Field, MD;  Location: [facility];  Service: Urology;  Laterality: N/A;   UMBILICAL HERNIA REPAIR  2011    Family History  Problem Relation Age of Onset   Pulmonary embolism Mother    Emphysema Father    Lung cancer Sister    Asthma Child    Asthma Child    Social History:  reports that he quit smoking about 32 years ago. His smoking use included cigarettes. He has a 20.00 pack-year smoking history. He has never used smokeless tobacco. He reports that he does not currently use alcohol. He reports current drug use. Drug: Marijuana.  Allergies:  Allergies  Allergen Reactions   Cheese Nausea And Vomiting   Lactose Intolerance (Gi) Diarrhea and Nausea And Vomiting    Medications Prior to Admission  Medication Sig Dispense Refill   ADVAIR  HFA 230-21 MCG/ACT inhaler INHALE 2 PUFFS INTO THE LUNGS TWICE A DAY 12 each 12   albuterol (PROVENTIL) (2.5 MG/3ML) 0.083% nebulizer solution Take 3 mLs (2.5 mg total) by nebulization every 6 (six) hours as needed for wheezing or shortness of breath. 225 mL 3   albuterol (VENTOLIN HFA) 108 (90 Base) MCG/ACT inhaler TAKE 2 PUFFS BY MOUTH EVERY 6 HOURS AS NEEDED FOR WHEEZE OR SHORTNESS OF BREATH 8.5 each 5   amLODipine (NORVASC) 10 MG tablet Take 1 tablet (10 mg total) by mouth daily. 30 tablet 1   atorvastatin (LIPITOR) 20 MG tablet Take 20 mg by mouth daily.     Dupilumab (DUPIXENT) 300 MG/2ML SOPN Inject 300 mg into the skin every 14 days. 12 mL 3   ELIQUIS 5 MG TABS tablet TAKE 1 TABLET BY MOUTH TWICE A DAY 60 tablet 2   Misc. Devices MISC Please dispense 1 flutter valve for use after inhalers 1 each 0   Tiotropium Bromide Monohydrate (SPIRIVA  RESPIMAT) 2.5 MCG/ACT AERS INHALE 2 PUFFS BY MOUTH INTO THE LUNGS DAILY 4 g 11   guaiFENesin (MUCINEX) 600 MG 12 hr tablet Take 1 tablet (600 mg total) by mouth 2 (two) times daily. (Patient not taking: Reported on 04/29/2022) 60 tablet 1   HYDROcodone bit-homatropine (HYCODAN) 5-1.5 MG/5ML syrup Take 5 mLs by mouth at bedtime as needed for cough. (Patient not taking: Reported on 04/29/2022) 240 mL 0   ondansetron (ZOFRAN-ODT) 4 MG disintegrating tablet  ODT q4 hours prn nausea/vomit 10 tablet 0   prochlorperazine (COMPAZINE) 10 MG tablet Take 1 tablet (10 mg total) by mouth every 6 (six) hours as needed for nausea or vomiting. 30 tablet 3   sodium chloride 0.9 % nebulizer solution Take 3 mLs by nebulization 3 (three) times daily as needed for wheezing. 90 mL 12    No results found for this or any previous visit (from the past 48 hour(s)). No results found.  Review of Systems  All other systems reviewed and are negative.   Blood pressure (!) 147/84, pulse (!) 44, temperature 97.7 F (36.5 C), temperature source Oral, resp. rate 15, height 5' 6.5" (1.689 m), weight 77.6 kg, SpO2 97 %. Physical Exam Eyes:     Extraocular Movements: Extraocular movements intact.  Pulmonary:     Effort: Pulmonary effort is normal.  Musculoskeletal:     Comments: L shoulder pain and weakness with RC testing. NVID  Neurological:     Mental Status: He is alert.  Psychiatric:        Mood and Affect: Mood normal.      Assessment/Plan L shoulder pain with small full thickness RCT, failed conservative treatment. Plan L arth RCR Risks / benefits of surgery discussed Consent on chart  NPO for OR Preop antibiotics   Glennon Hamilton, MD 05/09/2022, 11:14 AM

## 2022-05-09 NOTE — Anesthesia Procedure Notes (Signed)
Procedure Name: Intubation Date/Time: 05/09/2022 12:28 PM  Performed by: Shanon Payor, CRNAPre-anesthesia Checklist: Patient identified, Patient being monitored, Timeout performed, Emergency Drugs available and Suction available Patient Re-evaluated:Patient Re-evaluated prior to induction Oxygen Delivery Method: Circle System Utilized Preoxygenation: Pre-oxygenation with 100% oxygen Induction Type: IV induction Ventilation: Mask ventilation without difficulty Laryngoscope Size: Mac and 4 Grade View: Grade I Tube type: Oral Tube size: 7.0 mm Number of attempts: 1 Airway Equipment and Method: stylet Placement Confirmation: ETT inserted through vocal cords under direct vision, positive ETCO2 and breath sounds checked- equal and bilateral Secured at: 22 cm Tube secured with: Tape Dental Injury: Teeth and Oropharynx as per pre-operative assessment

## 2022-05-10 ENCOUNTER — Encounter (HOSPITAL_COMMUNITY): Payer: Self-pay | Admitting: Orthopedic Surgery

## 2022-05-17 DIAGNOSIS — M75102 Unspecified rotator cuff tear or rupture of left shoulder, not specified as traumatic: Secondary | ICD-10-CM | POA: Diagnosis not present

## 2022-05-27 ENCOUNTER — Other Ambulatory Visit (HOSPITAL_COMMUNITY): Payer: Self-pay

## 2022-05-28 DIAGNOSIS — R600 Localized edema: Secondary | ICD-10-CM | POA: Diagnosis not present

## 2022-05-29 ENCOUNTER — Other Ambulatory Visit: Payer: Self-pay

## 2022-06-05 ENCOUNTER — Other Ambulatory Visit (HOSPITAL_COMMUNITY): Payer: Self-pay

## 2022-06-05 ENCOUNTER — Telehealth: Payer: Self-pay | Admitting: Internal Medicine

## 2022-06-05 NOTE — Telephone Encounter (Signed)
Do we have any samples of Dupixant. Last one he got was frozen and he can not use it.  He states it is not cost effective for him. Pharm is by hospital. [facility].   Please call PT @ [phone]

## 2022-06-05 NOTE — Telephone Encounter (Signed)
Devki can you please advise on Dupixant samples

## 2022-06-05 NOTE — Telephone Encounter (Signed)
Patient's mother placed Dupixent in freezer at home by mistake. Spoke with patient. He will pick up sample today from clinic  Chesley Mires, PharmD, MPH, BCPS, CPP Clinical Pharmacist (Rheumatology and Pulmonology)

## 2022-06-05 NOTE — Telephone Encounter (Signed)
Medication Samples have been provided to the patient.  Drug name: DUPIXENT 300mg /79mL (2 pens in one box) NDC: [id number]  LOT: [id number]  Exp.Date: 11/21/2023  Dosing instructions: Inject 300mg  SQ every 2 weeks.  The patient has been instructed regarding the correct time, dose, and frequency of taking this medication, including desired effects and most common side effects.   James Barker 3:17 PM 06/05/2022

## 2022-06-08 ENCOUNTER — Encounter (HOSPITAL_BASED_OUTPATIENT_CLINIC_OR_DEPARTMENT_OTHER): Payer: Self-pay

## 2022-06-08 ENCOUNTER — Emergency Department (HOSPITAL_BASED_OUTPATIENT_CLINIC_OR_DEPARTMENT_OTHER): Payer: 59

## 2022-06-08 ENCOUNTER — Other Ambulatory Visit: Payer: Self-pay

## 2022-06-08 ENCOUNTER — Emergency Department (HOSPITAL_BASED_OUTPATIENT_CLINIC_OR_DEPARTMENT_OTHER)
Admission: EM | Admit: 2022-06-08 | Discharge: 2022-06-08 | Disposition: A | Discharge status: Home / Self Care | Payer: 59 | Attending: Emergency Medicine | Admitting: Emergency Medicine

## 2022-06-08 DIAGNOSIS — M7989 Other specified soft tissue disorders: Secondary | ICD-10-CM | POA: Diagnosis not present

## 2022-06-08 DIAGNOSIS — W228XXA Striking against or struck by other objects, initial encounter: Secondary | ICD-10-CM | POA: Insufficient documentation

## 2022-06-08 DIAGNOSIS — Z7901 Long term (current) use of anticoagulants: Secondary | ICD-10-CM | POA: Insufficient documentation

## 2022-06-08 DIAGNOSIS — S9031XA Contusion of right foot, initial encounter: Secondary | ICD-10-CM | POA: Diagnosis not present

## 2022-06-08 DIAGNOSIS — S99921A Unspecified injury of right foot, initial encounter: Secondary | ICD-10-CM | POA: Diagnosis present

## 2022-06-08 NOTE — ED Notes (Signed)
After visit summary and discharge instructions reviewed with patient by this RN. All questions and concerns addressed at this time. Patient stated understanding. Denies additional needs or request.  

## 2022-06-08 NOTE — Discharge Instructions (Addendum)
Please read and follow all provided instructions.  Your diagnoses today include:  1. Contusion of right foot, initial encounter    Tests performed today include: An x-ray of the affected area - does NOT show any broken bones Vital signs. See below for your results today.   Medications prescribed:  None  Take any prescribed medications only as directed.  Home care instructions:  Follow any educational materials contained in this packet Follow R.I.C.E. Protocol: R - rest your injury  I  - use ice on injury without applying directly to skin C - compress injury with bandage or splint E - elevate the injury as much as possible  Follow-up instructions: Please follow-up with your sports medicine doctor in the upcoming week as planned.  Return instructions:  Please return if your toes or feet are numb or tingling, appear gray or blue, or you have severe pain (also elevate the leg and loosen splint or wrap if you were given one) Please return to the Emergency Department if you experience worsening symptoms.  Please return if you have any other emergent concerns.  Additional Information:  Your vital signs today were: BP 119/78 (BP Location: Right Arm)   Pulse (!) 51   Temp 98.2 F (36.8 C) (Oral)   Resp 18   Ht 5\' 7"  (1.702 m)   Wt 77.1 kg   SpO2 97%   BMI 26.63 kg/m  If your blood pressure (BP) was elevated above 135/85 this visit, please have this repeated by your doctor within one month. --------------

## 2022-06-08 NOTE — ED Provider Notes (Signed)
[place] EMERGENCY DEPARTMENT AT [facility] Provider Note   CSN: [id number] Arrival date & time: 06/08/22  1214     History  Chief Complaint  Patient presents with   Foot Injury    James Barker is a 72 y.o. male.  Patient presents to the emergency department today for evaluation of right foot pain and swelling.  Patient had a shoulder surgery about 3 weeks ago.  He has been recovering at home.  He excellently dropped a tray onto the top of his right foot 6 days ago.  He has been able to walk, but has pain with weightbearing.  No knee or hip pain.  He has been taking Tylenol for pain.  He has noted mild swelling and both lower extremities without thigh or calf pain since his surgery.  He has had some bruising of the right foot.  No numbness.       Home Medications Prior to Admission medications   Medication Sig Start Date End Date Taking? Authorizing Provider  ADVAIR HFA 230-21 MCG/ACT inhaler INHALE 2 PUFFS INTO THE LUNGS TWICE A DAY 12/31/21   [place] Holler, MD  albuterol (PROVENTIL) (2.5 MG/3ML) 0.083% nebulizer solution Take 3 mLs (2.5 mg total) by nebulization every 6 (six) hours as needed for wheezing or shortness of breath. 04/26/21   Cobb, Ruby Cola, NP  albuterol (VENTOLIN HFA) 108 (90 Base) MCG/ACT inhaler TAKE 2 PUFFS BY MOUTH EVERY 6 HOURS AS NEEDED FOR WHEEZE OR SHORTNESS OF BREATH 11/02/21   [place] Holler, MD  amLODipine (NORVASC) 10 MG tablet Take 1 tablet (10 mg total) by mouth daily. 05/21/21   Almon Hercules, MD  atorvastatin (LIPITOR) 20 MG tablet Take 20 mg by mouth daily. 12/07/20   [provider]  Dupilumab (DUPIXENT) 300 MG/2ML SOPN Inject 300 mg into the skin every 14 days. 11/28/21   [place] Holler, MD  ELIQUIS 5 MG TABS tablet TAKE 1 TABLET BY MOUTH TWICE A DAY 03/08/22   [place] Holler, MD  HYDROcodone-acetaminophen (NORCO/VICODIN) 5-325 MG tablet Take 1 tablet by mouth every 4 (four) hours as needed for moderate pain. 05/09/22  05/09/23  Porterfield, Amber, PA-C  Misc. Devices MISC Please dispense 1 flutter valve for use after inhalers 02/20/21   [place] Holler, MD  ondansetron (ZOFRAN-ODT) 4 MG disintegrating tablet 4mg  ODT q4 hours prn nausea/vomit 12/19/21   Pollina, Canary Brim, MD  prochlorperazine (COMPAZINE) 10 MG tablet Take 1 tablet (10 mg total) by mouth every 6 (six) hours as needed for nausea or vomiting. 08/24/21   Benjiman Core, MD  sodium chloride 0.9 % nebulizer solution Take 3 mLs by nebulization 3 (three) times daily as needed for wheezing. 05/08/21   [place] Holler, MD  Tiotropium Bromide Monohydrate (SPIRIVA RESPIMAT) 2.5 MCG/ACT AERS INHALE 2 PUFFS BY MOUTH INTO THE LUNGS DAILY 05/06/22   [place] Holler, MD      Allergies    Cheese and Lactose intolerance (gi)    Review of Systems   Review of Systems  Physical Exam Updated Vital Signs BP 119/78 (BP Location: Right Arm)   Pulse (!) 51   Temp 98.2 F (36.8 C) (Oral)   Resp 18   Ht 5\' 7"  (1.702 m)   Wt 77.1 kg   SpO2 97%   BMI 26.63 kg/m  Physical Exam Vitals and nursing note reviewed.  Constitutional:      Appearance: He is well-developed.  HENT:     Head: Normocephalic  and atraumatic.  Eyes:     Conjunctiva/sclera: Conjunctivae normal.  Cardiovascular:     Pulses: Normal pulses. No decreased pulses.  Musculoskeletal:        General: Tenderness present.     Cervical back: Normal range of motion and neck supple.     Right lower leg: No edema.     Left lower leg: No edema.     Right ankle: No tenderness. Normal range of motion.     Right foot: Decreased range of motion. Normal capillary refill. Tenderness present. No bony tenderness. Normal pulse.     Comments: There is tenderness over the forefoot generally with associated edema.  Patient has had some mild bruising that has settled out into his toes.  I am able to passively range the foot but with some discomfort.  No medial or lateral malleoli or tenderness.  Skin:     General: Skin is warm and dry.  Neurological:     Mental Status: He is alert.     Sensory: No sensory deficit.     Comments: Motor, sensation, and vascular distal to the injury is fully intact.   Psychiatric:        Mood and Affect: Mood normal.     ED Results / Procedures / Treatments   Labs (all labs ordered are listed, but only abnormal results are displayed) Labs Reviewed - No data to display  EKG None  Radiology DG Foot Complete Right  Result Date: 06/08/2022 CLINICAL DATA:  Trauma to top of foot 6 days ago. EXAM: RIGHT FOOT COMPLETE - 3+ VIEW COMPARISON:  None Available. FINDINGS: Soft tissue swelling is present over the dorsum of the foot. Acute fracture is present. No radiopaque foreign body is present. The ankle is located. Degenerative changes are present in the hindfoot. IMPRESSION: Soft tissue swelling over the dorsum of the foot without underlying fracture. Electronically Signed   By: Marin Roberts M.D.   On: 06/08/2022 12:55    Procedures Procedures    Medications Ordered in ED Medications - No data to display  ED Course/ Medical Decision Making/ A&P    Patient seen and examined. History obtained directly from patient. Work-up including labs, imaging, EKG ordered in triage, if performed, were reviewed.    Labs/EKG: None ordered  Imaging: Independently reviewed and interpreted.  This included: X-ray of the foot agree negative  Medications/Fluids: None ordered  Most recent vital signs reviewed and are as follows: BP 119/78 (BP Location: Right Arm)   Pulse (!) 51   Temp 98.2 F (36.8 C) (Oral)   Resp 18   Ht 5\' 7"  (1.702 m)   Wt 77.1 kg   SpO2 97%   BMI 26.63 kg/m   Initial impression: Contusion of right foot  Offered Ace wrap versus postop shoe, patient agrees to try Ace wrap for stability and swelling.  Home treatment plan: RICE protocol, continue Tylenol  Return instructions discussed with patient: New or worsening symptoms  Follow-up  instructions discussed with patient: Follow-up with his sports medicine in 4 days.  He already has an appointment scheduled in regards to his left shoulder which she had surgery on 3 weeks ago.                            Medical Decision Making Amount and/or Complexity of Data Reviewed Radiology: ordered.   Patient with right foot contusion, x-ray is negative.  Lower extremity is neurovascularly intact.  No concern  for DVT.  Continue symptomatic treatment at this time.        Final Clinical Impression(s) / ED Diagnoses Final diagnoses:  Contusion of right foot, initial encounter    Rx / DC Orders ED Discharge Orders     None         Renne Crigler, PA-C 06/08/22 1317    Tegeler, Canary Brim, MD 06/08/22 [phone]

## 2022-06-08 NOTE — ED Triage Notes (Signed)
Patient here POV from Home.  Endorses Dropping a TV Tray on his Right Foot 6 Days ago. Pain to Area since.   NAD Noted during Triage. A&Ox4. GCS 15. Ambulatory.

## 2022-06-14 ENCOUNTER — Telehealth: Payer: Self-pay | Admitting: *Deleted

## 2022-06-14 NOTE — Telephone Encounter (Signed)
Transition Care Management Unsuccessful Follow-up Telephone Call  Date of discharge and from where:  [facility] 5/18  Attempts:  1st Attempt  Reason for unsuccessful TCM follow-up call:  No answer/busy

## 2022-06-18 ENCOUNTER — Telehealth: Payer: Self-pay | Admitting: *Deleted

## 2022-06-18 NOTE — Telephone Encounter (Signed)
Transition Care Management Unsuccessful Follow-up Telephone Call  Date of discharge and from where:  [facility] 06/08/2022  Attempts:  2nd Attempt  Reason for unsuccessful TCM follow-up call:  Patient declined answering questions

## 2022-06-19 DIAGNOSIS — I700 Atherosclerosis of aorta: Secondary | ICD-10-CM | POA: Diagnosis not present

## 2022-06-19 DIAGNOSIS — N1831 Chronic kidney disease, stage 3a: Secondary | ICD-10-CM | POA: Diagnosis not present

## 2022-06-19 DIAGNOSIS — D692 Other nonthrombocytopenic purpura: Secondary | ICD-10-CM | POA: Diagnosis not present

## 2022-06-19 DIAGNOSIS — D6869 Other thrombophilia: Secondary | ICD-10-CM | POA: Diagnosis not present

## 2022-06-19 DIAGNOSIS — E7800 Pure hypercholesterolemia, unspecified: Secondary | ICD-10-CM | POA: Diagnosis not present

## 2022-06-19 DIAGNOSIS — C679 Malignant neoplasm of bladder, unspecified: Secondary | ICD-10-CM | POA: Diagnosis not present

## 2022-06-19 DIAGNOSIS — J418 Mixed simple and mucopurulent chronic bronchitis: Secondary | ICD-10-CM | POA: Diagnosis not present

## 2022-06-24 ENCOUNTER — Emergency Department (HOSPITAL_COMMUNITY): Payer: 59

## 2022-06-24 ENCOUNTER — Encounter (HOSPITAL_COMMUNITY): Payer: Self-pay

## 2022-06-24 ENCOUNTER — Other Ambulatory Visit: Payer: Self-pay

## 2022-06-24 ENCOUNTER — Inpatient Hospital Stay (HOSPITAL_COMMUNITY)
Admission: EM | Admit: 2022-06-24 | Discharge: 2022-06-26 | DRG: 202 | Disposition: A | Discharge status: Home / Self Care | Payer: 59 | Attending: Internal Medicine | Admitting: Internal Medicine

## 2022-06-24 DIAGNOSIS — J440 Chronic obstructive pulmonary disease with acute lower respiratory infection: Secondary | ICD-10-CM | POA: Diagnosis present

## 2022-06-24 DIAGNOSIS — Z9221 Personal history of antineoplastic chemotherapy: Secondary | ICD-10-CM

## 2022-06-24 DIAGNOSIS — J206 Acute bronchitis due to rhinovirus: Secondary | ICD-10-CM | POA: Diagnosis not present

## 2022-06-24 DIAGNOSIS — Z1152 Encounter for screening for COVID-19: Secondary | ICD-10-CM | POA: Diagnosis not present

## 2022-06-24 DIAGNOSIS — Z7901 Long term (current) use of anticoagulants: Secondary | ICD-10-CM | POA: Diagnosis not present

## 2022-06-24 DIAGNOSIS — G4733 Obstructive sleep apnea (adult) (pediatric): Secondary | ICD-10-CM | POA: Diagnosis present

## 2022-06-24 DIAGNOSIS — R062 Wheezing: Secondary | ICD-10-CM | POA: Diagnosis not present

## 2022-06-24 DIAGNOSIS — I2699 Other pulmonary embolism without acute cor pulmonale: Secondary | ICD-10-CM | POA: Diagnosis present

## 2022-06-24 DIAGNOSIS — Z825 Family history of asthma and other chronic lower respiratory diseases: Secondary | ICD-10-CM

## 2022-06-24 DIAGNOSIS — Z95828 Presence of other vascular implants and grafts: Secondary | ICD-10-CM | POA: Diagnosis not present

## 2022-06-24 DIAGNOSIS — Z8615 Personal history of latent tuberculosis infection: Secondary | ICD-10-CM | POA: Diagnosis not present

## 2022-06-24 DIAGNOSIS — R0600 Dyspnea, unspecified: Secondary | ICD-10-CM | POA: Diagnosis not present

## 2022-06-24 DIAGNOSIS — I10 Essential (primary) hypertension: Secondary | ICD-10-CM | POA: Diagnosis present

## 2022-06-24 DIAGNOSIS — Z91018 Allergy to other foods: Secondary | ICD-10-CM

## 2022-06-24 DIAGNOSIS — J439 Emphysema, unspecified: Secondary | ICD-10-CM | POA: Diagnosis present

## 2022-06-24 DIAGNOSIS — Z801 Family history of malignant neoplasm of trachea, bronchus and lung: Secondary | ICD-10-CM

## 2022-06-24 DIAGNOSIS — E785 Hyperlipidemia, unspecified: Secondary | ICD-10-CM | POA: Diagnosis not present

## 2022-06-24 DIAGNOSIS — Z7951 Long term (current) use of inhaled steroids: Secondary | ICD-10-CM

## 2022-06-24 DIAGNOSIS — Z8551 Personal history of malignant neoplasm of bladder: Secondary | ICD-10-CM | POA: Diagnosis not present

## 2022-06-24 DIAGNOSIS — N400 Enlarged prostate without lower urinary tract symptoms: Secondary | ICD-10-CM | POA: Diagnosis present

## 2022-06-24 DIAGNOSIS — R911 Solitary pulmonary nodule: Secondary | ICD-10-CM | POA: Diagnosis not present

## 2022-06-24 DIAGNOSIS — I129 Hypertensive chronic kidney disease with stage 1 through stage 4 chronic kidney disease, or unspecified chronic kidney disease: Secondary | ICD-10-CM | POA: Diagnosis not present

## 2022-06-24 DIAGNOSIS — Z8616 Personal history of COVID-19: Secondary | ICD-10-CM

## 2022-06-24 DIAGNOSIS — R059 Cough, unspecified: Secondary | ICD-10-CM | POA: Diagnosis not present

## 2022-06-24 DIAGNOSIS — Z91011 Allergy to milk products: Secondary | ICD-10-CM | POA: Diagnosis not present

## 2022-06-24 DIAGNOSIS — E876 Hypokalemia: Secondary | ICD-10-CM | POA: Diagnosis present

## 2022-06-24 DIAGNOSIS — Z86711 Personal history of pulmonary embolism: Secondary | ICD-10-CM | POA: Diagnosis present

## 2022-06-24 DIAGNOSIS — R0689 Other abnormalities of breathing: Secondary | ICD-10-CM | POA: Diagnosis not present

## 2022-06-24 DIAGNOSIS — M351 Other overlap syndromes: Secondary | ICD-10-CM | POA: Diagnosis not present

## 2022-06-24 DIAGNOSIS — Z832 Family history of diseases of the blood and blood-forming organs and certain disorders involving the immune mechanism: Secondary | ICD-10-CM

## 2022-06-24 DIAGNOSIS — J441 Chronic obstructive pulmonary disease with (acute) exacerbation: Secondary | ICD-10-CM | POA: Diagnosis not present

## 2022-06-24 DIAGNOSIS — Z86718 Personal history of other venous thrombosis and embolism: Secondary | ICD-10-CM | POA: Diagnosis not present

## 2022-06-24 DIAGNOSIS — Z743 Need for continuous supervision: Secondary | ICD-10-CM | POA: Diagnosis not present

## 2022-06-24 DIAGNOSIS — Z87891 Personal history of nicotine dependence: Secondary | ICD-10-CM

## 2022-06-24 DIAGNOSIS — Z79899 Other long term (current) drug therapy: Secondary | ICD-10-CM

## 2022-06-24 DIAGNOSIS — R0602 Shortness of breath: Secondary | ICD-10-CM | POA: Diagnosis not present

## 2022-06-24 LAB — CBC WITH DIFFERENTIAL/PLATELET
Abs Immature Granulocytes: 0.07 10*3/uL (ref 0.00–0.07)
Basophils Absolute: 0 10*3/uL (ref 0.0–0.1)
Basophils Relative: 0 %
Eosinophils Absolute: 0 10*3/uL (ref 0.0–0.5)
Eosinophils Relative: 0 %
HCT: 38.4 % — ABNORMAL LOW (ref 39.0–52.0)
Hemoglobin: 12.5 g/dL — ABNORMAL LOW (ref 13.0–17.0)
Immature Granulocytes: 1 %
Lymphocytes Relative: 39 %
Lymphs Abs: 3.3 10*3/uL (ref 0.7–4.0)
MCH: 28.7 pg (ref 26.0–34.0)
MCHC: 32.6 g/dL (ref 30.0–36.0)
MCV: 88.3 fL (ref 80.0–100.0)
Monocytes Absolute: 0.8 10*3/uL (ref 0.1–1.0)
Monocytes Relative: 9 %
Neutro Abs: 4.4 10*3/uL (ref 1.7–7.7)
Neutrophils Relative %: 51 %
Platelets: 227 10*3/uL (ref 150–400)
RBC: 4.35 MIL/uL (ref 4.22–5.81)
RDW: 13.2 % (ref 11.5–15.5)
WBC: 8.6 10*3/uL (ref 4.0–10.5)
nRBC: 0 % (ref 0.0–0.2)

## 2022-06-24 LAB — RESPIRATORY PANEL BY PCR

## 2022-06-24 LAB — TROPONIN I (HIGH SENSITIVITY)
Troponin I (High Sensitivity): 3 ng/L (ref <18)
Troponin I (High Sensitivity): 3 ng/L (ref <18)

## 2022-06-24 LAB — COMPREHENSIVE METABOLIC PANEL
ALT: 13 U/L (ref 0–44)
AST: 21 U/L (ref 15–41)
Albumin: 3.7 g/dL (ref 3.5–5.0)
Alkaline Phosphatase: 58 U/L (ref 38–126)
Anion gap: 11 (ref 5–15)
BUN: 24 mg/dL — ABNORMAL HIGH (ref 8–23)
CO2: 19 mmol/L — ABNORMAL LOW (ref 22–32)
Calcium: 8.8 mg/dL — ABNORMAL LOW (ref 8.9–10.3)
Chloride: 108 mmol/L (ref 98–111)
Creatinine, Ser: 1.21 mg/dL (ref 0.61–1.24)
GFR, Estimated: >60 mL/min (ref >60)
Glucose, Bld: 94 mg/dL (ref 70–99)
Potassium: 3.3 mmol/L — ABNORMAL LOW (ref 3.5–5.1)
Sodium: 138 mmol/L (ref 135–145)
Total Bilirubin: 0.3 mg/dL (ref 0.3–1.2)
Total Protein: 6.9 g/dL (ref 6.5–8.1)

## 2022-06-24 LAB — BLOOD GAS, VENOUS
Acid-base deficit: 3.6 mmol/L — ABNORMAL HIGH (ref 0.0–2.0)
Bicarbonate: 20.4 mmol/L (ref 20.0–28.0)
Drawn by: 7725
O2 Saturation: 95.8 %
Patient temperature: 37
pCO2, Ven: 33 mmHg — ABNORMAL LOW (ref 44–60)
pH, Ven: 7.4 (ref 7.25–7.43)
pO2, Ven: 68 mmHg — ABNORMAL HIGH (ref 32–45)

## 2022-06-24 LAB — SARS CORONAVIRUS 2 BY RT PCR: SARS Coronavirus 2 by RT PCR: NEGATIVE

## 2022-06-24 LAB — BRAIN NATRIURETIC PEPTIDE: B Natriuretic Peptide: 127.7 pg/mL — ABNORMAL HIGH (ref 0.0–100.0)

## 2022-06-24 MED ORDER — DIPHENHYDRAMINE HCL 50 MG/ML IJ SOLN
25.0000 mg | Freq: Once | INTRAMUSCULAR | Status: AC
  Administered 2022-06-24: 25 mg via INTRAVENOUS
  Filled 2022-06-24: qty 1

## 2022-06-24 MED ORDER — IPRATROPIUM-ALBUTEROL 0.5-2.5 (3) MG/3ML IN SOLN
3.0000 mL | Freq: Three times a day (TID) | RESPIRATORY_TRACT | Status: DC
  Administered 2022-06-25: 3 mL via RESPIRATORY_TRACT
  Filled 2022-06-24: qty 3

## 2022-06-24 MED ORDER — AMLODIPINE BESYLATE 5 MG PO TABS
10.0000 mg | ORAL_TABLET | Freq: Every day | ORAL | Status: DC
  Administered 2022-06-24 – 2022-06-26 (×3): 10 mg via ORAL
  Filled 2022-06-24 (×3): qty 2

## 2022-06-24 MED ORDER — MAGNESIUM SULFATE 50 % IJ SOLN: 2.0000 g | Freq: Once | INTRAMUSCULAR | Status: DC

## 2022-06-24 MED ORDER — BUDESONIDE 0.25 MG/2ML IN SUSP
0.2500 mg | Freq: Two times a day (BID) | RESPIRATORY_TRACT | Status: DC
  Administered 2022-06-24 – 2022-06-26 (×4): 0.25 mg via RESPIRATORY_TRACT
  Filled 2022-06-24 (×4): qty 2

## 2022-06-24 MED ORDER — IPRATROPIUM-ALBUTEROL 0.5-2.5 (3) MG/3ML IN SOLN
3.0000 mL | RESPIRATORY_TRACT | Status: DC
  Administered 2022-06-24 (×2): 3 mL via RESPIRATORY_TRACT
  Filled 2022-06-24 (×2): qty 3

## 2022-06-24 MED ORDER — PROCHLORPERAZINE EDISYLATE 10 MG/2ML IJ SOLN
10.0000 mg | Freq: Once | INTRAMUSCULAR | Status: AC
  Administered 2022-06-24: 10 mg via INTRAVENOUS
  Filled 2022-06-24: qty 2

## 2022-06-24 MED ORDER — APIXABAN 5 MG PO TABS
5.0000 mg | ORAL_TABLET | Freq: Two times a day (BID) | ORAL | Status: DC
  Administered 2022-06-24 – 2022-06-26 (×4): 5 mg via ORAL
  Filled 2022-06-24 (×4): qty 1

## 2022-06-24 MED ORDER — SODIUM CHLORIDE (PF) 0.9 % IJ SOLN
INTRAMUSCULAR | Status: AC
  Filled 2022-06-24: qty 50

## 2022-06-24 MED ORDER — METHYLPREDNISOLONE SODIUM SUCC 125 MG IJ SOLR: 60.0000 mg | Freq: Three times a day (TID) | INTRAMUSCULAR | Status: DC

## 2022-06-24 MED ORDER — ATORVASTATIN CALCIUM 10 MG PO TABS
20.0000 mg | ORAL_TABLET | Freq: Every day | ORAL | Status: DC
  Administered 2022-06-24 – 2022-06-26 (×3): 20 mg via ORAL
  Filled 2022-06-24 (×3): qty 2

## 2022-06-24 MED ORDER — ACETAMINOPHEN 650 MG RE SUPP: 650.0000 mg | Freq: Four times a day (QID) | RECTAL | Status: DC | PRN

## 2022-06-24 MED ORDER — ONDANSETRON HCL 4 MG/2ML IJ SOLN
4.0000 mg | Freq: Four times a day (QID) | INTRAMUSCULAR | Status: DC | PRN
  Administered 2022-06-25: 4 mg via INTRAVENOUS
  Filled 2022-06-24: qty 2

## 2022-06-24 MED ORDER — POTASSIUM CHLORIDE CRYS ER 20 MEQ PO TBCR
40.0000 meq | EXTENDED_RELEASE_TABLET | Freq: Once | ORAL | Status: AC
  Administered 2022-06-24: 40 meq via ORAL
  Filled 2022-06-24: qty 2

## 2022-06-24 MED ORDER — METHYLPREDNISOLONE SODIUM SUCC 125 MG IJ SOLR
60.0000 mg | Freq: Two times a day (BID) | INTRAMUSCULAR | Status: DC
  Administered 2022-06-25 – 2022-06-26 (×3): 60 mg via INTRAVENOUS
  Filled 2022-06-24 (×3): qty 2

## 2022-06-24 MED ORDER — POLYETHYLENE GLYCOL 3350 17 G PO PACK: 17.0000 g | PACK | Freq: Every day | ORAL | Status: DC | PRN

## 2022-06-24 MED ORDER — GUAIFENESIN ER 600 MG PO TB12
600.0000 mg | ORAL_TABLET | Freq: Two times a day (BID) | ORAL | Status: DC
  Administered 2022-06-24 – 2022-06-26 (×4): 600 mg via ORAL
  Filled 2022-06-24 (×4): qty 1

## 2022-06-24 MED ORDER — ONDANSETRON HCL 4 MG PO TABS
4.0000 mg | ORAL_TABLET | Freq: Four times a day (QID) | ORAL | Status: DC | PRN
  Administered 2022-06-26: 4 mg via ORAL
  Filled 2022-06-24: qty 1

## 2022-06-24 MED ORDER — ALBUTEROL SULFATE (2.5 MG/3ML) 0.083% IN NEBU
10.0000 mg/h | INHALATION_SOLUTION | Freq: Once | RESPIRATORY_TRACT | Status: AC
  Administered 2022-06-24: 10 mg/h via RESPIRATORY_TRACT
  Filled 2022-06-24: qty 12

## 2022-06-24 MED ORDER — MAGNESIUM SULFATE 2 GM/50ML IV SOLN: 2.0000 g | Freq: Once | INTRAVENOUS | Status: DC

## 2022-06-24 MED ORDER — SODIUM CHLORIDE 0.9 % IV SOLN
2.0000 g | INTRAVENOUS | Status: DC
  Administered 2022-06-24: 2 g via INTRAVENOUS
  Filled 2022-06-24: qty 20

## 2022-06-24 MED ORDER — IPRATROPIUM-ALBUTEROL 0.5-2.5 (3) MG/3ML IN SOLN
6.0000 mL | Freq: Once | RESPIRATORY_TRACT | Status: AC
  Administered 2022-06-24: 6 mL via RESPIRATORY_TRACT
  Filled 2022-06-24: qty 6

## 2022-06-24 MED ORDER — SODIUM CHLORIDE 0.9 % IV SOLN
500.0000 mg | INTRAVENOUS | Status: DC
  Administered 2022-06-25: 500 mg via INTRAVENOUS
  Filled 2022-06-24 (×2): qty 5

## 2022-06-24 MED ORDER — ACETAMINOPHEN 325 MG PO TABS
650.0000 mg | ORAL_TABLET | Freq: Four times a day (QID) | ORAL | Status: DC | PRN
  Administered 2022-06-24 – 2022-06-26 (×3): 650 mg via ORAL
  Filled 2022-06-24 (×3): qty 2

## 2022-06-24 MED ORDER — IOHEXOL 350 MG/ML SOLN
100.0000 mL | Freq: Once | INTRAVENOUS | Status: AC | PRN
  Administered 2022-06-24: 80 mL via INTRAVENOUS

## 2022-06-24 MED ORDER — OXYCODONE HCL 5 MG PO TABS: 5.0000 mg | ORAL_TABLET | ORAL | Status: DC | PRN

## 2022-06-24 MED ORDER — ALBUTEROL SULFATE (2.5 MG/3ML) 0.083% IN NEBU
2.5000 mg | INHALATION_SOLUTION | RESPIRATORY_TRACT | Status: DC | PRN
  Administered 2022-06-25: 2.5 mg via RESPIRATORY_TRACT
  Filled 2022-06-24: qty 3

## 2022-06-24 NOTE — H&P (Addendum)
History and Physical    Patient: James Barker WUJ:[id number] DOB: [birth year] DOA: 06/24/2022 DOS: the patient was seen and examined on 06/24/2022 PCP: Wilfrid Lund, PA  Patient coming from: Home  Chief Complaint:  Chief Complaint  Patient presents with   Croup   Shortness of Breath   HPI: James Barker is a 72 y.o. male with medical history significant of severe allergic asthma/COPD overlap syndrome-on Dupixent, HCV-s/p treatment, VTE on Eliquis, bladder cancer-currently in remission-presenting to the hospital with the above-noted complaints.  Per patient-approximately 5 days or so ago-he started developing mostly a dry cough-this was followed by gradually worsening shortness of breath.  His primary care practitioner thought he had pneumonia and placed him on some amoxicillin.  He has been using his inhalers/nebulizers more frequently-and has not got much relief.  Due to persistent worsening shortness of breath-he presented to the hospital today-where he was found to have COPD exacerbation.  Patient was given a hour-long nebulizer-following which he feels much better.  Patient denies any fever, cough is mostly dry.  He does not have chest pain. Patient denies any headache Patient denies any nausea, vomiting or diarrhea Patient denies any abdominal pain Patient denies any hematuria/dysuria.   Review of Systems: As mentioned in the history of present illness. All other systems reviewed and are negative. Past Medical History:  Diagnosis Date   Anemia    Arthritis    Asthma-COPD overlap syndrome    pulmonology--- dr n desai;   last exacerbation 05-08-2021   Benign localized prostatic hyperplasia with lower urinary tract symptoms (LUTS)    Chronic cough    due to copd   Chronic kidney disease    Dairy product intolerance    DOE (dyspnea on exertion)    d/t asthma/ COPD   Dyspnea    Emphysema, unspecified (HCC)    Full dentures    History of bladder cancer 2020   s/p  TURBT w/ chemo instillation   History of COVID-19    per pt 2021 (no at same time of pneumonia)  w/ moderate symptoms , recovered at home, that resolved back to baseline   History of hepatitis C 2018   hx chronic hepatitis C,  per pt treated and told was cured   History of latent tuberculosis 2017   pt tested positive for TB exposure, not active, treated w/ INH for 9 months   History of MAI infection 2021   never treated   History of pulmonary embolism    (05-09-2021  pt stated never had clot prior to 2017 , none since 2020, had negative blood disorder work-up)//  2017  treated w/ xarelto 6 months//  and recurrent 04/ 2020 in setting severe UTI and deconditioning w/ prolonged admission due to intolerant to anticoagulants IVC filter inserted   Hypertension    OSA (obstructive sleep apnea)    per pt dx yrs ago, intolerant to cpap   Pneumonia    Pulmonary eosinophilia (HCC)    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   Weak urinary stream    Wears glasses    Past Surgical History:  Procedure Laterality Date   ANAL FISTULECTOMY     x5  last one 2008   BRONCHOSCOPY  2021   per pt had pneumonia   (done in [place])   INGUINAL HERNIA REPAIR Right 2010   IVC FILTER INSERTION  2020   in [place]   ORIF WRIST FRACTURE Left 2016   SHOULDER ARTHROSCOPY W/  ROTATOR CUFF REPAIR Left 2016   SHOULDER ARTHROSCOPY WITH ROTATOR CUFF REPAIR AND SUBACROMIAL DECOMPRESSION Left 05/09/2022   Procedure: SHOULDER ARTHROSCOPY WITH ROTATOR CUFF REPAIR AND SUBACROMIAL DECOMPRESSION;  Surgeon: Jones Broom, MD;  Location: [facility];  Service: Orthopedics;  Laterality: Left;   TRANSURETHRAL RESECTION OF BLADDER TUMOR  2020   in [place]   TRANSURETHRAL RESECTION OF PROSTATE  2021   in [place]   TRANSURETHRAL RESECTION OF PROSTATE N/A 05/11/2021   Procedure: PROSTATE EXAM UNDER ANESTHESIA; TRANSURETHRAL RESECTION OF THE PROSTATE (TURP), BLADDER BIOPSY;  Surgeon: Jerilee Field, MD;  Location: [facility];  Service: Urology;   Laterality: N/A;   UMBILICAL HERNIA REPAIR  2011   Social History:  reports that he quit smoking about 32 years ago. His smoking use included cigarettes. He has a 20.00 pack-year smoking history. He has never used smokeless tobacco. He reports that he does not currently use alcohol. He reports current drug use. Drug: Marijuana.  Allergies  Allergen Reactions   Cheese Nausea And Vomiting   Lactose Intolerance (Gi) Diarrhea and Nausea And Vomiting    Family History  Problem Relation Age of Onset   Pulmonary embolism Mother    Emphysema Father    Lung cancer Sister    Asthma Child    Asthma Child     Prior to Admission medications   Medication Sig Start Date End Date Taking? Authorizing Provider  ADVAIR HFA 230-21 MCG/ACT inhaler INHALE 2 PUFFS INTO THE LUNGS TWICE A DAY 12/31/21   Charlott Holler, MD  albuterol (PROVENTIL) (2.5 MG/3ML) 0.083% nebulizer solution Take 3 mLs (2.5 mg total) by nebulization every 6 (six) hours as needed for wheezing or shortness of breath. 04/26/21   Cobb, Ruby Cola, NP  albuterol (VENTOLIN HFA) 108 (90 Base) MCG/ACT inhaler TAKE 2 PUFFS BY MOUTH EVERY 6 HOURS AS NEEDED FOR WHEEZE OR SHORTNESS OF BREATH 11/02/21   Charlott Holler, MD  amLODipine (NORVASC) 10 MG tablet Take 1 tablet (10 mg total) by mouth daily. 05/21/21   Almon Hercules, MD  atorvastatin (LIPITOR) 20 MG tablet Take 20 mg by mouth daily. 12/07/20   [provider]  Dupilumab (DUPIXENT) 300 MG/2ML SOPN Inject 300 mg into the skin every 14 days. 11/28/21   Charlott Holler, MD  ELIQUIS 5 MG TABS tablet TAKE 1 TABLET BY MOUTH TWICE A DAY 03/08/22   Charlott Holler, MD  HYDROcodone-acetaminophen (NORCO/VICODIN) 5-325 MG tablet Take 1 tablet by mouth every 4 (four) hours as needed for moderate pain. 05/09/22 05/09/23  Porterfield, Amber, PA-C  Misc. Devices MISC Please dispense 1 flutter valve for use after inhalers 02/20/21   Charlott Holler, MD  ondansetron (ZOFRAN-ODT) 4 MG disintegrating  tablet 4mg  ODT q4 hours prn nausea/vomit 12/19/21   Pollina, Canary Brim, MD  prochlorperazine (COMPAZINE) 10 MG tablet Take 1 tablet (10 mg total) by mouth every 6 (six) hours as needed for nausea or vomiting. 08/24/21   Benjiman Core, MD  sodium chloride 0.9 % nebulizer solution Take 3 mLs by nebulization 3 (three) times daily as needed for wheezing. 05/08/21   Charlott Holler, MD  Tiotropium Bromide Monohydrate (SPIRIVA RESPIMAT) 2.5 MCG/ACT AERS INHALE 2 PUFFS BY MOUTH INTO THE LUNGS DAILY 05/06/22   Charlott Holler, MD    Physical Exam: Vitals:   06/24/22 1200 06/24/22 1213 06/24/22 1425 06/24/22 1505  BP: 121/67  113/76 120/63  Pulse: 70  71 76  Resp: 14  19 19  Temp:    97.6 F (36.4 C)  TempSrc:      SpO2: 98% 98% 95% 97%  Weight:      Height:       Gen Exam:Alert awake-not in any distress HEENT:atraumatic, normocephalic Chest: Moving air well-however has rhonchi all over.  Prolonged expiration. CVS:S1S2 regular Abdomen:soft non tender, non distended Extremities:no edema Neurology: Non focal Skin: no rash   Data Reviewed:     Latest Ref Rng & Units 06/24/2022   11:15 AM 05/01/2022   10:24 AM 12/19/2021    2:17 AM  CBC  WBC 4.0 - 10.5 K/uL 8.6  6.1  9.4   Hemoglobin 13.0 - 17.0 g/dL 16.1  09.6  04.5   Hematocrit 39.0 - 52.0 % 38.4  40.3  36.6   Platelets 150 - 400 K/uL 227  204  171         Latest Ref Rng & Units 06/24/2022   11:15 AM 05/01/2022   10:24 AM 12/19/2021    2:17 AM  BMP  Glucose 70 - 99 mg/dL 94  79  409   BUN 8 - 23 mg/dL 24  26  27    Creatinine 0.61 - 1.24 mg/dL 8.11  9.14  7.82   Sodium 135 - 145 mmol/L 138  140  136   Potassium 3.5 - 5.1 mmol/L 3.3  3.9  4.2   Chloride 98 - 111 mmol/L 108  108  107   CO2 22 - 32 mmol/L 19  24  20    Calcium 8.9 - 10.3 mg/dL 8.8  9.4  8.9      Twelve-lead EKG: Normal sinus rhythm  CT angio chest: No PNA/no PE.  Assessment and Plan: Severe allergic asthma/COPD overlap syndrome with exacerbation Feels  much better after a hour-long nebulized bronchodilator--still not at baseline-mildly tachypneic but otherwise looks stable-remains on room air Will be placed on IV Solu-Medrol, scheduled bronchodilators, empiric Rocephin/Zithromax Dupixent will be held for now Since he has mucous plugging evident on CT imaging-he will be placed on Mucinex/flutter/incentive spirometry Has been encouraged to mobilize as much as possible. Note-BNP is minimally elevated-he clinically does not have any signs of CHF.  Echo April 2023 with stable EF. Note-respiratory virus panel/COVID PCR ordered-currently pending.  HTN Amlodipine  HLD Statin  History of venous thromboembolism Continue Eliquis CTA chest negative for PE  History of bladder cancer Follow-up with oncology as previous-last chemo approximately was 6 months back-Per history-he seems to be in remission.  Hypokalemia Replete/recheck   Advance Care Planning:   Code Status: Full Code   Consults: None  Family Communication: None  Severity of Illness: The appropriate patient status for this patient is INPATIENT. Inpatient status is judged to be reasonable and necessary in order to provide the required intensity of service to ensure the patient's safety. The patient's presenting symptoms, physical exam findings, and initial radiographic and laboratory data in the context of their chronic comorbidities is felt to place them at high risk for further clinical deterioration. Furthermore, it is not anticipated that the patient will be medically stable for discharge from the hospital within 2 midnights of admission.   * I certify that at the point of admission it is my clinical judgment that the patient will require inpatient hospital care spanning beyond 2 midnights from the point of admission due to high intensity of service, high risk for further deterioration and high frequency of surveillance required.*  Author: Jeoffrey Massed, MD 06/24/2022 3:45  PM  For on call  review [email or web address].

## 2022-06-24 NOTE — ED Triage Notes (Signed)
Patient from home. Patient called EMS for SOB and cough. Per EMS patient took 3 albuterol neb treatments at home with no relief. Patient seen by Dr. Maurice Small week and dx with pneumonia prescribed antibiotics and steroids. Patient received a duoneb, solumedrol and mag with EMS. Patient has croupy cough and wheezing noted at triage. Patient is A&O x4. Patient c/o CP with coughing and SOB on arrival

## 2022-06-24 NOTE — ED Provider Notes (Signed)
[facility] [place] Provider Note  CSN: [id number] Arrival date & time: 06/24/22 1045  Chief Complaint(s) Croup and Shortness of Breath  HPI James Barker is a 72 y.o. male with PMH COPD, chronic hep C status post treatment, HTN, previous PE on Eliquis who presents emergency room for evaluation of cough and shortness of breath.  Patient states that he has had symptoms for approximately 1 week and saw his primary care physician last week and completed a course of antibiotics and steroids without improvement.  He took 3 albuterol neb treatments at home without improvement and patient found to be persistently wheezing with accessory muscle use and tachypnea by EMS.  He received 1 DuoNeb, Solu-Medrol and magnesium with EMS.  He was emergency room and he has persistent accessory muscle use and cough endorsing mild left-sided chest pain but denies abdominal pain, nausea, vomiting or other systemic symptoms.   Past Medical History Past Medical History:  Diagnosis Date   Anemia    Arthritis    Asthma-COPD overlap syndrome    pulmonology--- dr n desai;   last exacerbation 05-08-2021   Benign localized prostatic hyperplasia with lower urinary tract symptoms (LUTS)    Chronic cough    due to copd   Chronic kidney disease    Dairy product intolerance    DOE (dyspnea on exertion)    d/t asthma/ COPD   Dyspnea    Emphysema, unspecified (HCC)    Full dentures    History of bladder cancer 2020   s/p TURBT w/ chemo instillation   History of COVID-19    per pt 2021 (no at same time of pneumonia)  w/ moderate symptoms , recovered at home, that resolved back to baseline   History of hepatitis C 2018   hx chronic hepatitis C,  per pt treated and told was cured   History of latent tuberculosis 2017   pt tested positive for TB exposure, not active, treated w/ INH for 9 months   History of MAI infection 2021   never treated   History of pulmonary embolism     (05-09-2021  pt stated never had clot prior to 2017 , none since 2020, had negative blood disorder work-up)//  2017  treated w/ xarelto 6 months//  and recurrent 04/ 2020 in setting severe UTI and deconditioning w/ prolonged admission due to intolerant to anticoagulants IVC filter inserted   Hypertension    OSA (obstructive sleep apnea)    per pt dx yrs ago, intolerant to cpap   Pneumonia    Pulmonary eosinophilia (HCC)    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   Weak urinary stream    Wears glasses    Patient Active Problem List   Diagnosis Date Noted   COPD exacerbation (HCC) 06/24/2022   Bladder cancer (HCC) 06/06/2021   Insomnia 05/17/2021   CKD-3A 05/17/2021   Gross hematuria 05/16/2021   Bilateral pulmonary embolism (HCC) 05/15/2021   History of bladder cancer s/p TURBT and Chemo in [place] 05/15/2021   Presence of IVC filter 05/15/2021   BPH (benign prostatic hyperplasia) 05/15/2021   Chest pain 05/15/2021   Asthma with COPD with exacerbation (HCC) 04/13/2021   Eosinophilia 04/13/2021   Hypertension 04/13/2021   Allergic Asthma COPD Overlap Syndrome 01/02/2021   Home Medication(s) Prior to Admission medications   Medication Sig Start Date End Date Taking? Authorizing Provider  ADVAIR HFA 230-21 MCG/ACT inhaler INHALE 2 PUFFS INTO THE LUNGS TWICE A DAY  Patient taking differently: Inhale 2 puffs into the lungs 2 (two) times daily. 12/31/21  Yes Charlott Holler, MD  albuterol (PROVENTIL) (2.5 MG/3ML) 0.083% nebulizer solution Take 3 mLs (2.5 mg total) by nebulization every 6 (six) hours as needed for wheezing or shortness of breath. 04/26/21  Yes Cobb, Ruby Cola, NP  albuterol (VENTOLIN HFA) 108 (90 Base) MCG/ACT inhaler TAKE 2 PUFFS BY MOUTH EVERY 6 HOURS AS NEEDED FOR WHEEZE OR SHORTNESS OF BREATH Patient taking differently: Inhale 1 puff into the lungs every 6 (six) hours as needed for shortness of breath. 11/02/21  Yes Charlott Holler, MD  amLODipine (NORVASC) 10 MG tablet Take 1  tablet (10 mg total) by mouth daily. 05/21/21  Yes Almon Hercules, MD  amoxicillin (AMOXIL) 875 MG tablet Take 875 mg by mouth 2 (two) times daily. 06/19/22  Yes [provider]  atorvastatin (LIPITOR) 20 MG tablet Take 20 mg by mouth daily. 12/07/20  Yes [provider]  Dupilumab (DUPIXENT) 300 MG/2ML SOPN Inject 300 mg into the skin every 14 days. 11/28/21  Yes Charlott Holler, MD  ELIQUIS 5 MG TABS tablet TAKE 1 TABLET BY MOUTH TWICE A DAY 03/08/22  Yes Charlott Holler, MD  HYDROcodone-acetaminophen (NORCO/VICODIN) 5-325 MG tablet Take 1 tablet by mouth every 4 (four) hours as needed for moderate pain. 05/09/22 05/09/23 Yes Porterfield, Amber, PA-C  ondansetron (ZOFRAN-ODT) 4 MG disintegrating tablet 4mg  ODT q4 hours prn nausea/vomit Patient taking differently: Take 4 mg by mouth every 8 (eight) hours as needed for nausea or vomiting. 4mg  ODT q4 hours prn nausea/vomit 12/19/21  Yes Pollina, Canary Brim, MD  predniSONE (DELTASONE) 20 MG tablet Take 40 mg by mouth every morning. 06/19/22  Yes [provider]  sodium chloride 0.9 % nebulizer solution Take 3 mLs by nebulization 3 (three) times daily as needed for wheezing. 05/08/21  Yes Charlott Holler, MD  Tiotropium Bromide Monohydrate (SPIRIVA RESPIMAT) 2.5 MCG/ACT AERS INHALE 2 PUFFS BY MOUTH INTO THE LUNGS DAILY Patient taking differently: Inhale 2 each into the lungs daily. 05/06/22  Yes Charlott Holler, MD  Misc. Devices MISC Please dispense 1 flutter valve for use after inhalers 02/20/21   Charlott Holler, MD  prochlorperazine (COMPAZINE) 10 MG tablet Take 1 tablet (10 mg total) by mouth every 6 (six) hours as needed for nausea or vomiting. Patient not taking: Reported on 06/24/2022 08/24/21   Benjiman Core, MD                                                                                                                                    Past Surgical History Past Surgical History:  Procedure Laterality Date   ANAL  FISTULECTOMY     x5  last one 2008   BRONCHOSCOPY  2021   per pt had pneumonia   (done in [place])   INGUINAL HERNIA REPAIR Right 2010   IVC  FILTER INSERTION  2020   in [place]   ORIF WRIST FRACTURE Left 2016   SHOULDER ARTHROSCOPY W/ ROTATOR CUFF REPAIR Left 2016   SHOULDER ARTHROSCOPY WITH ROTATOR CUFF REPAIR AND SUBACROMIAL DECOMPRESSION Left 05/09/2022   Procedure: SHOULDER ARTHROSCOPY WITH ROTATOR CUFF REPAIR AND SUBACROMIAL DECOMPRESSION;  Surgeon: Jones Broom, MD;  Location: [facility];  Service: Orthopedics;  Laterality: Left;   TRANSURETHRAL RESECTION OF BLADDER TUMOR  2020   in [place]   TRANSURETHRAL RESECTION OF PROSTATE  2021   in [place]   TRANSURETHRAL RESECTION OF PROSTATE N/A 05/11/2021   Procedure: PROSTATE EXAM UNDER ANESTHESIA; TRANSURETHRAL RESECTION OF THE PROSTATE (TURP), BLADDER BIOPSY;  Surgeon: Jerilee Field, MD;  Location: [facility];  Service: Urology;  Laterality: N/A;   UMBILICAL HERNIA REPAIR  2011   Family History Family History  Problem Relation Age of Onset   Pulmonary embolism Mother    Emphysema Father    Lung cancer Sister    Asthma Child    Asthma Child     Social History Social History   Tobacco Use   Smoking status: Former    Packs/day: 1.00    Years: 20.00    Additional pack years: 0.00    Total pack years: 20.00    Types: Cigarettes    Quit date: 1992    Years since quitting: 32.4   Smokeless tobacco: Never  Vaping Use   Vaping Use: Never used  Substance Use Topics   Alcohol use: Not Currently   Drug use: Yes    Types: Marijuana    Comment: 05-09-2021  per pt smokes occasional   Allergies Cheese and Lactose intolerance (gi)  Review of Systems Review of Systems  Respiratory:  Positive for cough, shortness of breath and wheezing.   Cardiovascular:  Positive for chest pain.    Physical Exam Vital Signs  I have reviewed the triage vital signs BP 114/69 (BP Location: Right Arm)   Pulse 82   Temp 98.1 F (36.7 C)  (Oral)   Resp 20   Ht 5\' 7"  (1.702 m)   Wt 77.1 kg   SpO2 96%   BMI 26.63 kg/m   Physical Exam Constitutional:      General: He is not in acute distress.    Appearance: Normal appearance. He is ill-appearing.  HENT:     Head: Normocephalic and atraumatic.     Nose: No congestion or rhinorrhea.  Eyes:     General:        Right eye: No discharge.        Left eye: No discharge.     Extraocular Movements: Extraocular movements intact.     Pupils: Pupils are equal, round, and reactive to light.  Cardiovascular:     Rate and Rhythm: Normal rate and regular rhythm.     Heart sounds: No murmur heard. Pulmonary:     Effort: Tachypnea, accessory muscle usage and respiratory distress present.     Breath sounds: Wheezing present. No rales.  Abdominal:     General: There is no distension.     Tenderness: There is no abdominal tenderness.  Musculoskeletal:        General: Normal range of motion.     Cervical back: Normal range of motion.  Skin:    General: Skin is warm and dry.  Neurological:     General: No focal deficit present.     Mental Status: He is alert.     ED Results and Treatments Labs (all  labs ordered are listed, but only abnormal results are displayed) Labs Reviewed  RESPIRATORY PANEL BY PCR - Abnormal; Notable for the following components:      Result Value   Rhinovirus / Enterovirus DETECTED (*)    All other components within normal limits  COMPREHENSIVE METABOLIC PANEL - Abnormal; Notable for the following components:   Potassium 3.3 (*)    CO2 19 (*)    BUN 24 (*)    Calcium 8.8 (*)    All other components within normal limits  CBC WITH DIFFERENTIAL/PLATELET - Abnormal; Notable for the following components:   Hemoglobin 12.5 (*)    HCT 38.4 (*)    All other components within normal limits  BRAIN NATRIURETIC PEPTIDE - Abnormal; Notable for the following components:   B Natriuretic Peptide 127.7 (*)    All other components within normal limits  BLOOD  GAS, VENOUS - Abnormal; Notable for the following components:   pCO2, Ven 33 (*)    pO2, Ven 68 (*)    Acid-base deficit 3.6 (*)    All other components within normal limits  SARS CORONAVIRUS 2 BY RT PCR  CBC  BASIC METABOLIC PANEL  TROPONIN I (HIGH SENSITIVITY)  TROPONIN I (HIGH SENSITIVITY)                                                                                                                          Radiology CT Angio Chest PE W and/or Wo Contrast  Result Date: 06/24/2022 CLINICAL DATA:  Pulmonary embolism suspected, high probability. Cough and shortness of breath. EXAM: CT ANGIOGRAPHY CHEST WITH CONTRAST TECHNIQUE: Multidetector CT imaging of the chest was performed using the standard protocol during bolus administration of intravenous contrast. Multiplanar CT image reconstructions and MIPs were obtained to evaluate the vascular anatomy. RADIATION DOSE REDUCTION: This exam was performed according to the departmental dose-optimization program which includes automated exposure control, adjustment of the mA and/or kV according to patient size and/or use of iterative reconstruction technique. CONTRAST:  80mL OMNIPAQUE IOHEXOL 350 MG/ML SOLN COMPARISON:  Chest CTA 05/15/2021 FINDINGS: Cardiovascular: Negative for pulmonary embolism. Ascending thoracic aorta measures to 3.8 cm. Great vessels are patent. Heart size is within normal limits. Mediastinum/Nodes: No mediastinal or hilar lymph node enlargement. No significant axillary lymph node enlargement. Lungs/Pleura: Azygous lobe. Centrilobular emphysema. Mucous plugging or bronchial wall thickening involving right lower lobe airways. 3 mm nodule in the anterior right lung on image 74/12 has minimally changed since 01/02/2021. Compressive atelectasis in the posterior lower lobes bilaterally. Again noted is focal eventration or small diaphragmatic hernia involving the medial right hemidiaphragm. Stable scarring at the lung apices. Upper Abdomen:  Exophytic cyst in the right kidney upper pole that does not require dedicated follow-up. No acute abnormality in visualized upper abdomen. IVC filter is only partially imaged. Musculoskeletal: No acute bone abnormality Review of the MIP images confirms the above findings. IMPRESSION: 1. Negative for pulmonary embolism. 2. Mucous plugging and/or bronchial wall thickening in the  right lower lobe. 3. Centrilobular emphysema with areas of atelectasis in the lower lobes. 4. 3 mm nodule in the anterior right lung is likely benign based on the stability. 5.  Emphysema (ICD10-J43.9). Electronically Signed   By: Richarda Overlie M.D.   On: 06/24/2022 15:15   DG Chest Portable 1 View  Result Date: 06/24/2022 CLINICAL DATA:  Dyspnea EXAM: PORTABLE CHEST 1 VIEW COMPARISON:  X-ray 03/05/2022 FINDINGS: Azygous fissure. Right midlung scar or atelectasis. No consolidation, pneumothorax or effusion. Normal cardiopericardial silhouette. Old right-sided rib fracture. Subtle nodular density along the left upper lung overlapping the interspace of the posterior aspect of the left fourth and fifth ribs. Recommend dedicated chest CT when appropriate to confirm etiology. IMPRESSION: Right midlung scar or atelectasis. Subtle nodular density along the left upper lung. Recommend follow-up chest CT when appropriate to confirm etiology. Electronically Signed   By: Karen Kays M.D.   On: 06/24/2022 11:52    Pertinent labs & imaging results that were available during my care of the patient were reviewed by me and considered in my medical decision making (see MDM for details).  Medications Ordered in ED Medications  amLODipine (NORVASC) tablet 10 mg (10 mg Oral Given 06/24/22 1759)  atorvastatin (LIPITOR) tablet 20 mg (20 mg Oral Given 06/24/22 1759)  apixaban (ELIQUIS) tablet 5 mg (5 mg Oral Given 06/24/22 1759)  budesonide (PULMICORT) nebulizer solution 0.25 mg (0.25 mg Nebulization Given 06/24/22 1900)  albuterol (PROVENTIL) (2.5 MG/3ML) 0.083%  nebulizer solution 2.5 mg (has no administration in time range)  guaiFENesin (MUCINEX) 12 hr tablet 600 mg (600 mg Oral Given 06/24/22 2150)  cefTRIAXone (ROCEPHIN) 2 g in sodium chloride 0.9 % 100 mL IVPB (2 g Intravenous New Bag/Given 06/24/22 1758)  azithromycin (ZITHROMAX) 500 mg in sodium chloride 0.9 % 250 mL IVPB (has no administration in time range)  acetaminophen (TYLENOL) tablet 650 mg (650 mg Oral Given 06/24/22 2149)    Or  acetaminophen (TYLENOL) suppository 650 mg ( Rectal See Alternative 06/24/22 2149)  oxyCODONE (Oxy IR/ROXICODONE) immediate release tablet 5 mg (has no administration in time range)  polyethylene glycol (MIRALAX / GLYCOLAX) packet 17 g (has no administration in time range)  ondansetron (ZOFRAN) tablet 4 mg (has no administration in time range)    Or  ondansetron (ZOFRAN) injection 4 mg (has no administration in time range)  methylPREDNISolone sodium succinate (SOLU-MEDROL) 125 mg/2 mL injection 60 mg (has no administration in time range)  ipratropium-albuterol (DUONEB) 0.5-2.5 (3) MG/3ML nebulizer solution 3 mL (has no administration in time range)  ipratropium-albuterol (DUONEB) 0.5-2.5 (3) MG/3ML nebulizer solution 6 mL (6 mLs Nebulization Given 06/24/22 1120)  albuterol (PROVENTIL) (2.5 MG/3ML) 0.083% nebulizer solution (10 mg/hr Nebulization Given 06/24/22 1210)  prochlorperazine (COMPAZINE) injection 10 mg (10 mg Intravenous Given 06/24/22 1224)  diphenhydrAMINE (BENADRYL) injection 25 mg (25 mg Intravenous Given 06/24/22 1223)  iohexol (OMNIPAQUE) 350 MG/ML injection 100 mL (80 mLs Intravenous Contrast Given 06/24/22 1431)  potassium chloride SA (KLOR-CON M) CR tablet 40 mEq (40 mEq Oral Given 06/24/22 1758)  Procedures .Critical Care  Performed by: Glendora Score, MD Authorized by: Glendora Score, MD   Critical care provider statement:     Critical care time (minutes):  30   Critical care was necessary to treat or prevent imminent or life-threatening deterioration of the following conditions:  Respiratory failure   Critical care was time spent personally by me on the following activities:  Development of treatment plan with patient or surrogate, discussions with consultants, evaluation of patient's response to treatment, examination of patient, ordering and review of laboratory studies, ordering and review of radiographic studies, ordering and performing treatments and interventions, pulse oximetry, re-evaluation of patient's condition and review of old charts   (including critical care time)  Medical Decision Making / ED Course   This patient presents to the ED for concern of shortness of breath, this involves an extensive number of treatment options, and is a complaint that carries with it a high risk of complications and morbidity.  The differential diagnosis includes Pe, PTX, Pulmonary Edema, ARDS, COPD/Asthma, ACS, CHF exacerbation, Arrhythmia, Pericardial Effusion/Tamponade, Anemia, Sepsis, Acidosis/Hypercapnia, Anxiety, Viral URI  MDM: Patient seen emergency room for evaluation of shortness of breath.  Physical exam reveals an ill-appearing tachypneic patient with wheezing and accessory muscle use and frequent coughing spells.  He also has asymmetric lower extremity edema with left mildly larger than right.  He is currently on Eliquis and compliant but does have a history of multiple PEs.  Laboratory evaluation with a hypokalemia of 3.3, CO2 19, hemoglobin 12.5, pH 7.4 with no significant hypercarbia.  BNP minimally elevated at 127.7.  Patient received 2 additional DuoNeb's and on reevaluation work of breathing improved but wheezing persistent.  He then received a 10 mg albuterol continuous treatment and again had improvement of his accessory muscle use but had persistent wheezing.  PE study was obtained that was reassuringly  negative for PE but does show mucous plugging in the right lower lobe, atelectasis in bilateral lower lobes and a benign 3 mm lung nodule.  As patient's wheezing is persistent, he will require hospital admission for q4 albuterol treatments.  Patient admitted.   Additional history obtained: -Additional history obtained from wife -External records from outside source obtained and reviewed including: Chart review including previous notes, labs, imaging, consultation notes   Lab Tests: -I ordered, reviewed, and interpreted labs.   The pertinent results include:   Labs Reviewed  RESPIRATORY PANEL BY PCR - Abnormal; Notable for the following components:      Result Value   Rhinovirus / Enterovirus DETECTED (*)    All other components within normal limits  COMPREHENSIVE METABOLIC PANEL - Abnormal; Notable for the following components:   Potassium 3.3 (*)    CO2 19 (*)    BUN 24 (*)    Calcium 8.8 (*)    All other components within normal limits  CBC WITH DIFFERENTIAL/PLATELET - Abnormal; Notable for the following components:   Hemoglobin 12.5 (*)    HCT 38.4 (*)    All other components within normal limits  BRAIN NATRIURETIC PEPTIDE - Abnormal; Notable for the following components:   B Natriuretic Peptide 127.7 (*)    All other components within normal limits  BLOOD GAS, VENOUS - Abnormal; Notable for the following components:   pCO2, Ven 33 (*)    pO2, Ven 68 (*)    Acid-base deficit 3.6 (*)    All other components within normal limits  SARS CORONAVIRUS 2 BY RT PCR  CBC  BASIC METABOLIC PANEL  TROPONIN I (HIGH SENSITIVITY)  TROPONIN I (HIGH SENSITIVITY)      EKG   EKG Interpretation  Date/Time:  Monday June 24 2022 10:54:44 EDT Ventricular Rate:  76 PR Interval:  180 QRS Duration: 97 QT Interval:  386 QTC Calculation: 434 R Axis:   50 Text Interpretation: Sinus rhythm Confirmed by Vamsi Apfel ([id number]) on 06/24/2022 10:01:41 PM         Imaging Studies ordered: I  ordered imaging studies including chest x-ray, CT PE I independently visualized and interpreted imaging. I agree with the radiologist interpretation   Medicines ordered and prescription drug management: Meds ordered this encounter  Medications   ipratropium-albuterol (DUONEB) 0.5-2.5 (3) MG/3ML nebulizer solution 6 mL   DISCONTD: magnesium sulfate (IV Push/IM) injection 2 g   DISCONTD: magnesium sulfate IVPB 2 g 50 mL   albuterol (PROVENTIL) (2.5 MG/3ML) 0.083% nebulizer solution   prochlorperazine (COMPAZINE) injection 10 mg   diphenhydrAMINE (BENADRYL) injection 25 mg   iohexol (OMNIPAQUE) 350 MG/ML injection 100 mL   amLODipine (NORVASC) tablet 10 mg   atorvastatin (LIPITOR) tablet 20 mg   apixaban (ELIQUIS) tablet 5 mg   budesonide (PULMICORT) nebulizer solution 0.25 mg   DISCONTD: ipratropium-albuterol (DUONEB) 0.5-2.5 (3) MG/3ML nebulizer solution 3 mL   albuterol (PROVENTIL) (2.5 MG/3ML) 0.083% nebulizer solution 2.5 mg   guaiFENesin (MUCINEX) 12 hr tablet 600 mg   cefTRIAXone (ROCEPHIN) 2 g in sodium chloride 0.9 % 100 mL IVPB    Order Specific Question:   Antibiotic Indication:    Answer:   CAP   azithromycin (ZITHROMAX) 500 mg in sodium chloride 0.9 % 250 mL IVPB    Order Specific Question:   Antibiotic Indication:    Answer:   CAP   DISCONTD: methylPREDNISolone sodium succinate (SOLU-MEDROL) 125 mg/2 mL injection 60 mg   OR Linked Order Group    acetaminophen (TYLENOL) tablet 650 mg    acetaminophen (TYLENOL) suppository 650 mg   oxyCODONE (Oxy IR/ROXICODONE) immediate release tablet 5 mg   polyethylene glycol (MIRALAX / GLYCOLAX) packet 17 g   OR Linked Order Group    ondansetron (ZOFRAN) tablet 4 mg    ondansetron (ZOFRAN) injection 4 mg   potassium chloride SA (KLOR-CON M) CR tablet 40 mEq   methylPREDNISolone sodium succinate (SOLU-MEDROL) 125 mg/2 mL injection 60 mg   ipratropium-albuterol (DUONEB) 0.5-2.5 (3) MG/3ML nebulizer solution 3 mL    -I have reviewed  the patients home medicines and have made adjustments as needed  Critical interventions Multiple DuoNebs, continuous albuterol    Cardiac Monitoring: The patient was maintained on a cardiac monitor.  I personally viewed and interpreted the cardiac monitored which showed an underlying rhythm of: NSR  Social Determinants of Health:  Factors impacting patients care include: none   Reevaluation: After the interventions noted above, I reevaluated the patient and found that they have :improved  Co morbidities that complicate the patient evaluation  Past Medical History:  Diagnosis Date   Anemia    Arthritis    Asthma-COPD overlap syndrome    pulmonology--- dr n desai;   last exacerbation 05-08-2021   Benign localized prostatic hyperplasia with lower urinary tract symptoms (LUTS)    Chronic cough    due to copd   Chronic kidney disease    Dairy product intolerance    DOE (dyspnea on exertion)    d/t asthma/ COPD   Dyspnea    Emphysema, unspecified (HCC)    Full dentures    History of bladder cancer 2020  s/p TURBT w/ chemo instillation   History of COVID-19    per pt 2021 (no at same time of pneumonia)  w/ moderate symptoms , recovered at home, that resolved back to baseline   History of hepatitis C 2018   hx chronic hepatitis C,  per pt treated and told was cured   History of latent tuberculosis 2017   pt tested positive for TB exposure, not active, treated w/ INH for 9 months   History of MAI infection 2021   never treated   History of pulmonary embolism    (05-09-2021  pt stated never had clot prior to 2017 , none since 2020, had negative blood disorder work-up)//  2017  treated w/ xarelto 6 months//  and recurrent 04/ 2020 in setting severe UTI and deconditioning w/ prolonged admission due to intolerant to anticoagulants IVC filter inserted   Hypertension    OSA (obstructive sleep apnea)    per pt dx yrs ago, intolerant to cpap   Pneumonia    Pulmonary eosinophilia  (HCC)    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   Weak urinary stream    Wears glasses       Dispostion: I considered admission for this patient, and due to persistent COPD exacerbation with sustained wheezing he will require hospital admission     Final Clinical Impression(s) / ED Diagnoses Final diagnoses:  COPD exacerbation [facility])     @PCDICTATION @    Glendora Score, MD 06/24/22 2202

## 2022-06-25 ENCOUNTER — Other Ambulatory Visit (HOSPITAL_COMMUNITY): Payer: Self-pay

## 2022-06-25 DIAGNOSIS — J441 Chronic obstructive pulmonary disease with (acute) exacerbation: Secondary | ICD-10-CM | POA: Diagnosis not present

## 2022-06-25 LAB — BASIC METABOLIC PANEL
Anion gap: 10 (ref 5–15)
BUN: 24 mg/dL — ABNORMAL HIGH (ref 8–23)
CO2: 21 mmol/L — ABNORMAL LOW (ref 22–32)
Calcium: 8.6 mg/dL — ABNORMAL LOW (ref 8.9–10.3)
Chloride: 105 mmol/L (ref 98–111)
Creatinine, Ser: 1.13 mg/dL (ref 0.61–1.24)
GFR, Estimated: >60 mL/min (ref >60)
Glucose, Bld: 100 mg/dL — ABNORMAL HIGH (ref 70–99)
Potassium: 4.1 mmol/L (ref 3.5–5.1)
Sodium: 136 mmol/L (ref 135–145)

## 2022-06-25 LAB — CBC
HCT: 35.9 % — ABNORMAL LOW (ref 39.0–52.0)
Hemoglobin: 11.7 g/dL — ABNORMAL LOW (ref 13.0–17.0)
MCH: 28.6 pg (ref 26.0–34.0)
MCHC: 32.6 g/dL (ref 30.0–36.0)
MCV: 87.8 fL (ref 80.0–100.0)
Platelets: 205 10*3/uL (ref 150–400)
RBC: 4.09 MIL/uL — ABNORMAL LOW (ref 4.22–5.81)
RDW: 13.3 % (ref 11.5–15.5)
WBC: 11.4 10*3/uL — ABNORMAL HIGH (ref 4.0–10.5)
nRBC: 0 % (ref 0.0–0.2)

## 2022-06-25 MED ORDER — IPRATROPIUM-ALBUTEROL 0.5-2.5 (3) MG/3ML IN SOLN
3.0000 mL | Freq: Four times a day (QID) | RESPIRATORY_TRACT | Status: DC
  Administered 2022-06-25: 3 mL via RESPIRATORY_TRACT
  Filled 2022-06-25: qty 3

## 2022-06-25 MED ORDER — IPRATROPIUM-ALBUTEROL 0.5-2.5 (3) MG/3ML IN SOLN: 3.0000 mL | Freq: Two times a day (BID) | RESPIRATORY_TRACT | Status: DC

## 2022-06-25 MED ORDER — IPRATROPIUM-ALBUTEROL 0.5-2.5 (3) MG/3ML IN SOLN
3.0000 mL | Freq: Three times a day (TID) | RESPIRATORY_TRACT | Status: DC
  Administered 2022-06-26: 3 mL via RESPIRATORY_TRACT
  Filled 2022-06-25 (×2): qty 3

## 2022-06-25 MED ORDER — MELATONIN 5 MG PO TABS
5.0000 mg | ORAL_TABLET | Freq: Once | ORAL | Status: AC
  Administered 2022-06-25: 5 mg via ORAL
  Filled 2022-06-25: qty 1

## 2022-06-25 NOTE — Progress Notes (Signed)
PROGRESS NOTE    James Barker  ZOX:[id number] DOB: Mar 14, 1950 DOA: 06/24/2022 PCP: Wilfrid Lund, PA  Chief Complaint  Patient presents with   Croup   Shortness of Breath    Brief Narrative:   James Barker is James Barker 72 y.o. 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  See below for additional details    Assessment & Plan:   Principal Problem:   COPD exacerbation (HCC) Active Problems:   Bilateral pulmonary embolism (HCC)   History of bladder cancer s/p TURBT and Chemo in [place]   Allergic Asthma COPD Overlap Syndrome   Hypertension  Severe allergic asthma  COPD overlap syndrome with exacerbation  Rhinovirus Infection Continues on RA, though with significantly increased WOB (increased WOB with minimal exertion) Rhinovirus positive Continue scheduled and prn nebs, steroids Stop ceftriaxone, continue azithromycin BNP mildly elevated, not overloaded on exam Strict I/O, daily weights CT chest without PE, mucous plugging and/or bronchial wall thickening in the RLL, centrilobular emphysema   HTN Amlodipine   HLD Statin   History of venous thromboembolism Continue Eliquis CTA chest negative for PE   History of bladder cancer Follow-up with oncology as previous-last chemo approximately was 6 months back Per history-he seems to be in remission   Follow outpatient    Hypokalemia improved  3 mm nodule in anterior right lung Likely benign per rads    DVT prophylaxis: eliquis Code Status: full Family Communication: wife at bedside Disposition:   Status is: Inpatient Remains inpatient appropriate because: pending further improvement   Consultants:  none  Procedures:  none  Antimicrobials:  Anti-infectives (From admission, onward)     Start     Dose/Rate Route Frequency Ordered Stop   06/24/22 1600  cefTRIAXone (ROCEPHIN) 2 g in sodium chloride 0.9 % 100 mL IVPB  Status:  Discontinued        2 g 200 mL/hr over 30 Minutes Intravenous Every 24 hours 06/24/22 1544 06/25/22 1028   06/24/22 1600  azithromycin (ZITHROMAX) 500 mg in sodium chloride 0.9 % 250 mL IVPB        500 mg 250 mL/hr over 60 Minutes Intravenous Every 24 hours 06/24/22 1544 06/27/22 1559       Subjective: Continued SOB Between 50-75% better  Objective: Vitals:   06/25/22 0442 06/25/22 0800 06/25/22 1305 06/25/22 1337  BP: 132/80  124/79   Pulse: 71  72   Resp: 18     Temp: 97.8 F (36.6 C)  98 F (36.7 C)   TempSrc: Oral  Oral   SpO2: 97% 98% 96% 97%  Weight:      Height:        Intake/Output Summary (Last 24 hours) at 06/25/2022 1549 Last data filed at 06/25/2022 1500 Gross per 24 hour  Intake 360 ml  Output --  Net 360 ml   Filed Weights   06/24/22 1109  Weight: 77.1 kg    Examination:  General exam: Appears calm and comfortable  Respiratory system: diffuse wheezing, sob with minimal maneuvering in bed Cardiovascular system: RRR Gastrointestinal system: Abdomen is nondistended, soft and nontender. Central nervous system: Alert and oriented. No focal neurological deficits. Extremities: no LEE    Data Reviewed: I have personally reviewed following labs and imaging studies  CBC: Recent Labs  Lab 06/24/22 1115 06/25/22 0537  WBC 8.6 11.4*  NEUTROABS 4.4  --   HGB 12.5* 11.7*  HCT 38.4* 35.9*  MCV 88.3 87.8  PLT 227 205    Basic Metabolic Panel: Recent Labs  Lab 06/24/22 1115 06/25/22 0537  NA 138 136  K 3.3* 4.1  CL 108 105  CO2 19* 21*  GLUCOSE 94 100*  BUN 24* 24*  CREATININE 1.21 1.13  CALCIUM 8.8* 8.6*    GFR: Estimated Creatinine Clearance: 55.2 mL/min (by C-G formula based on SCr of 1.13 mg/dL).  Liver Function Tests: Recent Labs  Lab 06/24/22 1115  AST 21  ALT 13  ALKPHOS 58  BILITOT  0.3  PROT 6.9  ALBUMIN 3.7    CBG: No results for input(s): "GLUCAP" in the last 168 hours.   Recent Results (from the past 240 hour(s))  Respiratory (~20 pathogens) panel by PCR     Status: Abnormal   Collection Time: 06/24/22  3:58 PM   Specimen: Nasopharyngeal Swab; Respiratory  Result Value Ref Range Status   Adenovirus NOT DETECTED NOT DETECTED Final   Coronavirus 229E NOT DETECTED NOT DETECTED Final    Comment: (NOTE) The Coronavirus on the Respiratory Panel, DOES NOT test for the novel  Coronavirus (2019 nCoV)    Coronavirus HKU1 NOT DETECTED NOT DETECTED Final   Coronavirus NL63 NOT DETECTED NOT DETECTED Final   Coronavirus OC43 NOT DETECTED NOT DETECTED Final   Metapneumovirus NOT DETECTED NOT DETECTED Final   Rhinovirus / Enterovirus DETECTED (James Barker) NOT DETECTED Final   Influenza James Barker NOT DETECTED NOT DETECTED Final   Influenza B NOT DETECTED NOT DETECTED Final   Parainfluenza Virus 1 NOT DETECTED NOT DETECTED Final   Parainfluenza Virus 2 NOT DETECTED NOT DETECTED Final   Parainfluenza Virus 3 NOT DETECTED NOT DETECTED Final   Parainfluenza Virus 4 NOT DETECTED NOT DETECTED Final   Respiratory Syncytial Virus NOT DETECTED NOT DETECTED Final   Bordetella pertussis NOT DETECTED NOT DETECTED Final   Bordetella Parapertussis NOT DETECTED NOT DETECTED Final   Chlamydophila pneumoniae NOT DETECTED NOT DETECTED Final   Mycoplasma pneumoniae NOT DETECTED NOT DETECTED Final    Comment: Performed at [facility], [address] [place], [place] [address]  SARS Coronavirus 2 by RT PCR (hospital order, performed in [facility] hospital lab) *cepheid single result test* Nasopharyngeal Swab     Status: None   Collection Time: 06/24/22  3:58 PM   Specimen: Nasopharyngeal Swab; Nasal Swab  Result Value Ref Range Status   SARS Coronavirus 2 by RT PCR NEGATIVE NEGATIVE Final    Comment: (NOTE) SARS-CoV-2 target nucleic acids are NOT DETECTED.  The SARS-CoV-2 RNA is generally  detectable in upper and lower respiratory specimens during the acute phase of infection. The lowest concentration of SARS-CoV-2 viral copies this assay can detect is 250 copies / mL. James Barker negative result does not preclude SARS-CoV-2 infection and should not be used as the sole basis for treatment or other patient management decisions.  James Barker negative result may occur with improper specimen collection / handling, submission of specimen other than nasopharyngeal swab, presence of viral mutation(s) within the areas targeted by this assay, and inadequate number of viral copies (<250 copies / mL). James Barker negative result must be combined with clinical observations, patient history, and epidemiological information.  Fact Sheet for Patients:   RoadLapTop.co.za  Fact Sheet for Healthcare Providers: http://kim-miller.com/  This test is not yet approved or  cleared by the [place] FDA and has been authorized for detection and/or diagnosis of SARS-CoV-2  by FDA under an Emergency Use Authorization (EUA).  This EUA will remain in effect (meaning this test can be used) for the duration of the COVID-19 declaration under Section 564(b)(1) of the Act, 21 U.S.C. section 360bbb-3(b)(1), unless the authorization is terminated or revoked sooner.  Performed at [facility], [address] [place], [place] [address]          Radiology Studies: CT Angio Chest PE W and/or Wo Contrast  Result Date: 06/24/2022 CLINICAL DATA:  Pulmonary embolism suspected, high probability. Cough and shortness of breath. EXAM: CT ANGIOGRAPHY CHEST WITH CONTRAST TECHNIQUE: Multidetector CT imaging of the chest was performed using the standard protocol during bolus administration of intravenous contrast. Multiplanar CT image reconstructions and MIPs were obtained to evaluate the vascular anatomy. RADIATION DOSE REDUCTION: This exam was performed according to the  departmental dose-optimization program which includes automated exposure control, adjustment of the mA and/or kV according to patient size and/or use of iterative reconstruction technique. CONTRAST:  80mL OMNIPAQUE IOHEXOL 350 MG/ML SOLN COMPARISON:  Chest CTA 05/15/2021 FINDINGS: Cardiovascular: Negative for pulmonary embolism. Ascending thoracic aorta measures to 3.8 cm. Great vessels are patent. Heart size is within normal limits. Mediastinum/Nodes: No mediastinal or hilar lymph node enlargement. No significant axillary lymph node enlargement. Lungs/Pleura: Azygous lobe. Centrilobular emphysema. Mucous plugging or bronchial wall thickening involving right lower lobe airways. 3 mm nodule in the anterior right lung on image 74/12 has minimally changed since 01/02/2021. Compressive atelectasis in the posterior lower lobes bilaterally. Again noted is focal eventration or small diaphragmatic hernia involving the medial right hemidiaphragm. Stable scarring at the lung apices. Upper Abdomen: Exophytic cyst in the right kidney upper pole that does not require dedicated follow-up. No acute abnormality in visualized upper abdomen. IVC filter is only partially imaged. Musculoskeletal: No acute bone abnormality Review of the MIP images confirms the above findings. IMPRESSION: 1. Negative for pulmonary embolism. 2. Mucous plugging and/or bronchial wall thickening in the right lower lobe. 3. Centrilobular emphysema with areas of atelectasis in the lower lobes. 4. 3 mm nodule in the anterior right lung is likely benign based on the stability. 5.  Emphysema (ICD10-J43.9). Electronically Signed   By: Richarda Overlie M.D.   On: 06/24/2022 15:15   DG Chest Portable 1 View  Result Date: 06/24/2022 CLINICAL DATA:  Dyspnea EXAM: PORTABLE CHEST 1 VIEW COMPARISON:  X-ray 03/05/2022 FINDINGS: Azygous fissure. Right midlung scar or atelectasis. No consolidation, pneumothorax or effusion. Normal cardiopericardial silhouette. Old right-sided  rib fracture. Subtle nodular density along the left upper lung overlapping the interspace of the posterior aspect of the left fourth and fifth ribs. Recommend dedicated chest CT when appropriate to confirm etiology. IMPRESSION: Right midlung scar or atelectasis. Subtle nodular density along the left upper lung. Recommend follow-up chest CT when appropriate to confirm etiology. Electronically Signed   By: Karen Kays M.D.   On: 06/24/2022 11:52        Scheduled Meds:  amLODipine  10 mg Oral Daily   apixaban  5 mg Oral BID   atorvastatin  20 mg Oral Daily   budesonide (PULMICORT) nebulizer solution  0.25 mg Nebulization BID   guaiFENesin  600 mg Oral BID   ipratropium-albuterol  3 mL Nebulization BID   methylPREDNISolone (SOLU-MEDROL) injection  60 mg Intravenous Q12H   Continuous Infusions:  azithromycin       LOS: 1 day    Time spent: over 30 min    Lacretia Nicks, MD [facility]  To contact the attending provider between 7A-7P or the covering provider during after hours 7P-7A, please log into the web site [email or web address] and access using universal [place] password for that web site. If you do not have the password, please call the hospital operator.  06/25/2022, 3:49 PM

## 2022-06-25 NOTE — TOC CM/SW Note (Signed)
Transition of Care [facility]) - Inpatient Brief Assessment   Patient Details  Name: James Barker MRN: [id number] Date of Birth: [birth year]  Transition of Care [facility]) CM/SW Contact:    Durenda Guthrie, RN Phone Number: 06/25/2022, 1:28 PM   Clinical Narrative: Case manager spoke with patient for brief assessment. Plans to return home when medically ready.    Transition of Care Asessment: Insurance and Status: Insurance coverage has been reviewed Patient has primary care physician: Yes Horton Marshall, [place]) Home environment has been reviewed: lives in ranch style home, doesnt live alone Prior level of function:: independent Prior/Current Home Services: No current home services Social Determinants of Health Reivew: SDOH reviewed no interventions necessary Readmission risk has been reviewed: Yes Transition of care needs: no transition of care needs at this time

## 2022-06-26 ENCOUNTER — Other Ambulatory Visit (HOSPITAL_COMMUNITY): Payer: Self-pay

## 2022-06-26 DIAGNOSIS — J206 Acute bronchitis due to rhinovirus: Secondary | ICD-10-CM | POA: Diagnosis not present

## 2022-06-26 LAB — COMPREHENSIVE METABOLIC PANEL
ALT: 15 U/L (ref 0–44)
AST: 22 U/L (ref 15–41)
Albumin: 3.5 g/dL (ref 3.5–5.0)
Alkaline Phosphatase: 55 U/L (ref 38–126)
Anion gap: 9 (ref 5–15)
BUN: 32 mg/dL — ABNORMAL HIGH (ref 8–23)
CO2: 22 mmol/L (ref 22–32)
Calcium: 8.9 mg/dL (ref 8.9–10.3)
Chloride: 105 mmol/L (ref 98–111)
Creatinine, Ser: 1.21 mg/dL (ref 0.61–1.24)
GFR, Estimated: >60 mL/min (ref >60)
Glucose, Bld: 109 mg/dL — ABNORMAL HIGH (ref 70–99)
Potassium: 4.9 mmol/L (ref 3.5–5.1)
Sodium: 136 mmol/L (ref 135–145)
Total Bilirubin: 0.5 mg/dL (ref 0.3–1.2)
Total Protein: 6.3 g/dL — ABNORMAL LOW (ref 6.5–8.1)

## 2022-06-26 LAB — CBC WITH DIFFERENTIAL/PLATELET
Abs Immature Granulocytes: 0.19 10*3/uL — ABNORMAL HIGH (ref 0.00–0.07)
Basophils Absolute: 0 10*3/uL (ref 0.0–0.1)
Basophils Relative: 0 %
Eosinophils Absolute: 0 10*3/uL (ref 0.0–0.5)
Eosinophils Relative: 0 %
HCT: 38.8 % — ABNORMAL LOW (ref 39.0–52.0)
Hemoglobin: 12.7 g/dL — ABNORMAL LOW (ref 13.0–17.0)
Immature Granulocytes: 1 %
Lymphocytes Relative: 7 %
Lymphs Abs: 1.1 10*3/uL (ref 0.7–4.0)
MCH: 28.9 pg (ref 26.0–34.0)
MCHC: 32.7 g/dL (ref 30.0–36.0)
MCV: 88.4 fL (ref 80.0–100.0)
Monocytes Absolute: 0.6 10*3/uL (ref 0.1–1.0)
Monocytes Relative: 4 %
Neutro Abs: 12.8 10*3/uL — ABNORMAL HIGH (ref 1.7–7.7)
Neutrophils Relative %: 88 %
Platelets: 238 10*3/uL (ref 150–400)
RBC: 4.39 MIL/uL (ref 4.22–5.81)
RDW: 13.4 % (ref 11.5–15.5)
WBC: 14.7 10*3/uL — ABNORMAL HIGH (ref 4.0–10.5)
nRBC: 0 % (ref 0.0–0.2)

## 2022-06-26 LAB — MAGNESIUM: Magnesium: 2.5 mg/dL — ABNORMAL HIGH (ref 1.7–2.4)

## 2022-06-26 LAB — PHOSPHORUS: Phosphorus: 4.3 mg/dL (ref 2.5–4.6)

## 2022-06-26 MED ORDER — GUAIFENESIN ER 600 MG PO TB12
600.0000 mg | ORAL_TABLET | Freq: Two times a day (BID) | ORAL | 1 refills | Status: DC
  Filled 2022-06-26: qty 60, 30d supply, fill #0

## 2022-06-26 MED ORDER — BUDESONIDE 0.25 MG/2ML IN SUSP
0.2500 mg | Freq: Two times a day (BID) | RESPIRATORY_TRACT | 12 refills | Status: DC
  Filled 2022-06-26: qty 60, 15d supply, fill #0

## 2022-06-26 MED ORDER — AZITHROMYCIN 500 MG PO TABS
500.0000 mg | ORAL_TABLET | Freq: Every day | ORAL | 0 refills | Status: AC
  Filled 2022-06-26: qty 1, 1d supply, fill #0

## 2022-06-26 MED ORDER — PREDNISONE 20 MG PO TABS
40.0000 mg | ORAL_TABLET | Freq: Every morning | ORAL | 0 refills | Status: AC
  Filled 2022-06-26: qty 10, 5d supply, fill #0

## 2022-06-26 NOTE — Discharge Summary (Signed)
Physician Discharge Summary   Patient: James Barker MRN: [id number] DOB: [birth year]  Admit date:     06/24/2022  Discharge date: 06/26/22  Discharge Physician: Jonah Blue   PCP: Wilfrid Lund, PA   Recommendations at discharge:   Complete 1 additional of day of azithromycin (through 6/6) and steroids through 6/10 Add twice daily pulmicort nebulizer treatments Follow up with pulmonology in 1-2 weeks and discuss possible need for testing for MAC recurrence (can be referred back to Dr. Luciana Axe with infectious disease if needed) Follow up with PCP in 1-2 weeks Use Mucinex twice daily as needed for cough/congestion  Discharge Diagnoses: Principal Problem:   Acute bronchitis due to Rhinovirus Active Problems:   Bilateral pulmonary embolism (HCC)   History of bladder cancer s/p TURBT and Chemo in [place]   Allergic Asthma COPD Overlap Syndrome   Hypertension   COPD exacerbation [facility])    Hospital Course: 72yo male with h/o severe allergic asthma/COPD overlap syndrome on Dupixent, HCV s/p treatment, VTE on ELiquis, and bladder cancer in remission who was admitted on 6/3 with COPD and found to have rhinovirus infection.  He is currently being treated with azithromycin and steroids.  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 Luciana Axe in the past and can be referred back if needed (discussed with Dr. Renold Don)    HTN -Continue amlodipine   HLD -Continue atorvastatin   History of venous thromboembolism -Continue Eliquis -CTA chest negative for PE during this hospitalization   History of bladder cancer -Follow-up with oncology as previous-last chemo approximately was 6 months back -Per history-he seems to be in remission   -Follow up with urology  Lung nodule -3mm in anterior R lung -Likely benign based on stability     Consultants: TOC team Procedures performed: None  Disposition: Home Diet recommendation:  Regular diet DISCHARGE MEDICATION: Allergies as of 06/26/2022       Reactions   Cheese Nausea And Vomiting   Lactose Intolerance (gi) Diarrhea, Nausea And Vomiting        Medication List     STOP taking these medications    amoxicillin 875 MG tablet Commonly known as: AMOXIL   Misc. Devices Misc   prochlorperazine 10 MG tablet Commonly known as: COMPAZINE       TAKE these medications    Advair HFA 230-21 MCG/ACT inhaler Generic drug: fluticasone-salmeterol INHALE 2 PUFFS INTO THE LUNGS TWICE A DAY What changed: See the new instructions.   albuterol (2.5 MG/3ML) 0.083% nebulizer solution Commonly known as: PROVENTIL Take 3 mLs (2.5 mg total) by nebulization every 6 (six) hours as needed for wheezing or shortness of breath. What changed: Another medication with the same name was changed. Make sure you understand how and when to take each.   albuterol 108 (90 Base) MCG/ACT inhaler Commonly known as: VENTOLIN HFA TAKE 2 PUFFS BY MOUTH EVERY 6 HOURS AS NEEDED FOR WHEEZE OR SHORTNESS OF BREATH What changed: See the new instructions.   amLODipine 10 MG tablet Commonly known as: NORVASC Take 1  tablet (10 mg total) by mouth daily.   atorvastatin 20 MG tablet Commonly known as: LIPITOR Take 20 mg by mouth daily.   azithromycin 500 MG tablet Commonly known as: Zithromax Take 1 tablet (500 mg total) by mouth daily for 1 day.   budesonide 0.25 MG/2ML  nebulizer solution Commonly known as: PULMICORT Take 2 mLs (0.25 mg total) by nebulization 2 (two) times daily.   Dupixent 300 MG/2ML Sopn Generic drug: Dupilumab Inject 300 mg into the skin every 14 days.   Eliquis 5 MG Tabs tablet Generic drug: apixaban TAKE 1 TABLET BY MOUTH TWICE A DAY   guaiFENesin 600 MG 12 hr tablet Commonly known as: MUCINEX Take 1 tablet (600 mg total) by mouth 2 (two) times daily.   HYDROcodone-acetaminophen 5-325 MG tablet Commonly known as: NORCO/VICODIN Take 1 tablet by mouth every 4 (four) hours as needed for moderate pain.   ondansetron 4 MG disintegrating tablet Commonly known as: ZOFRAN-ODT 4mg  ODT q4 hours prn nausea/vomit What changed:  how much to take how to take this when to take this reasons to take this   predniSONE 20 MG tablet Commonly known as: DELTASONE Take 2 tablets (40 mg total) by mouth every morning for 5 days.   sodium chloride 0.9 % nebulizer solution Take 3 mLs by nebulization 3 (three) times daily as needed for wheezing.   Spiriva Respimat 2.5 MCG/ACT Aers Generic drug: Tiotropium Bromide Monohydrate INHALE 2 PUFFS BY MOUTH INTO THE LUNGS DAILY What changed: See the new instructions.        Discharge Exam: Filed Weights   06/24/22 1109  Weight: 77.1 kg   Subjective: He is feeling some better.  No O2 requirement.  Sill very SOB with ambulation even just to the bathroom but wants to go home today.  He did have significant nausea last night and then night sweats overnight.  This concerns him because he has remote h/o MAC and is concerned about recurrence.  He would like to see ID prior to dc today, if possible.     Physical Exam:       Vitals:    06/25/22 1337 06/25/22 1915 06/25/22 2044 06/26/22 0602  BP:   127/77   120/89  Pulse:   67   61  Resp:   16   16  Temp:   98 F (36.7 C)   98.2 F (36.8 C)  TempSrc:   Oral   Oral  SpO2: 97% 96% 96% 95%  Weight:          Height:            General:   Appears calm and comfortable and is in NAD, on RA Eyes:  EOMI, normal lids, iris ENT:  grossly normal hearing, lips & tongue, mmm; artificial dentition Neck:  no LAD, masses or thyromegaly Cardiovascular:  RRR, no m/r/g. No LE edema.  Respiratory:   CTA bilaterally with mild inspiratory and expiratory wheezing.  Normal respiratory effort. Abdomen:  soft, NT, ND Skin:  no rash or induration seen on limited exam Musculoskeletal:  grossly normal tone BUE/BLE, good ROM, no bony abnormality Psychiatric:  grossly normal mood and affect, speech fluent and appropriate, AOx3 Neurologic:  CN 2-12 grossly intact, moves all extremities in coordinated fashion        EKG: last on 6/3     Labs on Admission: I have personally reviewed the available labs and imaging studies at the time of the admission.   Pertinent labs:  BUN 32 WBC 14.7  Condition at discharge: improving  The results of significant diagnostics from this hospitalization (including imaging, microbiology, ancillary and laboratory) are listed below for reference.   Imaging Studies: CT Angio Chest PE W and/or Wo Contrast  Result Date: 06/24/2022 CLINICAL DATA:  Pulmonary embolism suspected, high probability. Cough and shortness of breath. EXAM: CT ANGIOGRAPHY CHEST WITH CONTRAST TECHNIQUE: Multidetector CT imaging of the chest was performed using the standard protocol during bolus administration of intravenous contrast. Multiplanar CT image reconstructions and MIPs were obtained to evaluate the vascular anatomy. RADIATION DOSE REDUCTION: This exam was performed according to the departmental dose-optimization program which includes automated exposure control, adjustment of the mA and/or kV according to patient size and/or use of iterative reconstruction technique. CONTRAST:  80mL OMNIPAQUE IOHEXOL 350 MG/ML SOLN COMPARISON:  Chest CTA 05/15/2021 FINDINGS: Cardiovascular: Negative for pulmonary embolism. Ascending thoracic aorta measures  to 3.8 cm. Great vessels are patent. Heart size is within normal limits. Mediastinum/Nodes: No mediastinal or hilar lymph node enlargement. No significant axillary lymph node enlargement. Lungs/Pleura: Azygous lobe. Centrilobular emphysema. Mucous plugging or bronchial wall thickening involving right lower lobe airways. 3 mm nodule in the anterior right lung on image 74/12 has minimally changed since 01/02/2021. Compressive atelectasis in the posterior lower lobes bilaterally. Again noted is focal eventration or small diaphragmatic hernia involving the medial right hemidiaphragm. Stable scarring at the lung apices. Upper Abdomen: Exophytic cyst in the right kidney upper pole that does not require dedicated follow-up. No acute abnormality in visualized upper abdomen. IVC filter is only partially imaged. Musculoskeletal: No acute bone abnormality Review of the MIP images confirms the above findings. IMPRESSION: 1. Negative for pulmonary embolism. 2. Mucous plugging and/or bronchial wall thickening in the right lower lobe. 3. Centrilobular emphysema with areas of atelectasis in the lower lobes. 4. 3 mm nodule in the anterior right lung is likely benign based on the stability. 5.  Emphysema (ICD10-J43.9). Electronically Signed   By: Richarda Overlie M.D.   On: 06/24/2022 15:15   DG Chest Portable 1 View  Result Date: 06/24/2022 CLINICAL DATA:  Dyspnea EXAM: PORTABLE CHEST 1 VIEW COMPARISON:  X-ray 03/05/2022 FINDINGS: Azygous fissure. Right midlung scar or atelectasis. No consolidation, pneumothorax or effusion. Normal cardiopericardial silhouette. Old right-sided rib fracture. Subtle nodular density along the left upper lung overlapping the interspace of the posterior aspect of the left fourth and fifth ribs. Recommend dedicated chest CT when appropriate to confirm etiology. IMPRESSION: Right midlung scar or atelectasis. Subtle nodular density along the left upper lung. Recommend follow-up chest CT when appropriate to  confirm etiology. Electronically Signed   By: Karen Kays M.D.   On: 06/24/2022 11:52   DG Foot Complete Right  Result Date: 06/08/2022 CLINICAL DATA:  Trauma to top of foot 6 days ago. EXAM: RIGHT FOOT COMPLETE - 3+ VIEW COMPARISON:  None Available. FINDINGS: Soft tissue swelling is present over the dorsum of the foot. Acute fracture is present. No radiopaque foreign body is present. The ankle is located. Degenerative changes are present in the hindfoot. IMPRESSION: Soft tissue swelling over the dorsum of the foot without underlying fracture. Electronically Signed   By: Marin Roberts M.D.   On: 06/08/2022 12:55    Microbiology: Results for orders placed or performed during the hospital encounter of 06/24/22  Respiratory (~20 pathogens) panel by PCR     Status: Abnormal   Collection Time: 06/24/22  3:58 PM   Specimen: Nasopharyngeal Swab; Respiratory  Result Value Ref Range  Status   Adenovirus NOT DETECTED NOT DETECTED Final   Coronavirus 229E NOT DETECTED NOT DETECTED Final    Comment: (NOTE) The Coronavirus on the Respiratory Panel, DOES NOT test for the novel  Coronavirus (2019 nCoV)    Coronavirus HKU1 NOT DETECTED NOT DETECTED Final   Coronavirus NL63 NOT DETECTED NOT DETECTED Final   Coronavirus OC43 NOT DETECTED NOT DETECTED Final   Metapneumovirus NOT DETECTED NOT DETECTED Final   Rhinovirus / Enterovirus DETECTED (A) NOT DETECTED Final   Influenza A NOT DETECTED NOT DETECTED Final   Influenza B NOT DETECTED NOT DETECTED Final   Parainfluenza Virus 1 NOT DETECTED NOT DETECTED Final   Parainfluenza Virus 2 NOT DETECTED NOT DETECTED Final   Parainfluenza Virus 3 NOT DETECTED NOT DETECTED Final   Parainfluenza Virus 4 NOT DETECTED NOT DETECTED Final   Respiratory Syncytial Virus NOT DETECTED NOT DETECTED Final   Bordetella pertussis NOT DETECTED NOT DETECTED Final   Bordetella Parapertussis NOT DETECTED NOT DETECTED Final   Chlamydophila pneumoniae NOT DETECTED NOT  DETECTED Final   Mycoplasma pneumoniae NOT DETECTED NOT DETECTED Final    Comment: Performed at [facility], [address], [place], [place] [address]  SARS Coronavirus 2 by RT PCR (hospital order, performed in [facility] hospital lab) *cepheid single result test* Nasopharyngeal Swab     Status: None   Collection Time: 06/24/22  3:58 PM   Specimen: Nasopharyngeal Swab; Nasal Swab  Result Value Ref Range Status   SARS Coronavirus 2 by RT PCR NEGATIVE NEGATIVE Final    Comment: (NOTE) SARS-CoV-2 target nucleic acids are NOT DETECTED.  The SARS-CoV-2 RNA is generally detectable in upper and lower respiratory specimens during the acute phase of infection. The lowest concentration of SARS-CoV-2 viral copies this assay can detect is 250 copies / mL. A negative result does not preclude SARS-CoV-2 infection and should not be used as the sole basis for treatment or other patient management decisions.  A negative result may occur with improper specimen collection / handling, submission of specimen other than nasopharyngeal swab, presence of viral mutation(s) within the areas targeted by this assay, and inadequate number of viral copies (<250 copies / mL). A negative result must be combined with clinical observations, patient history, and epidemiological information.  Fact Sheet for Patients:   RoadLapTop.co.za  Fact Sheet for Healthcare Providers: http://kim-miller.com/  This test is not yet approved or  cleared by the [place] FDA and has been authorized for detection and/or diagnosis of SARS-CoV-2 by FDA under an Emergency Use Authorization (EUA).  This EUA will remain in effect (meaning this test can be used) for the duration of the COVID-19 declaration under Section 564(b)(1) of the Act, 21 U.S.C. section 360bbb-3(b)(1), unless the authorization is terminated or revoked sooner.  Performed at [facility], [address], [place], [place] [address]      Discharge time spent: greater than 30 minutes.  Signed: Jonah Blue, MD [facility] 06/26/2022

## 2022-06-26 NOTE — Progress Notes (Signed)
Discharge paperwork reviewed with pt.  IV removed and pt taken to discharge area via wheelchair.

## 2022-06-26 NOTE — Hospital Course (Signed)
72yo male with h/o severe allergic asthma/COPD overlap syndrome on Dupixent, HCV s/p treatment, VTE on ELiquis, and bladder cancer in remission who was admitted on 6/3 with COPD and found to have rhinovirus infection.  He is currently being treated with azithromycin and steroids.

## 2022-06-28 ENCOUNTER — Other Ambulatory Visit: Payer: Self-pay

## 2022-06-28 ENCOUNTER — Other Ambulatory Visit (HOSPITAL_COMMUNITY): Payer: Self-pay

## 2022-07-01 ENCOUNTER — Other Ambulatory Visit (HOSPITAL_COMMUNITY): Payer: Self-pay

## 2022-07-01 ENCOUNTER — Ambulatory Visit (INDEPENDENT_AMBULATORY_CARE_PROVIDER_SITE_OTHER): Payer: 59 | Admitting: Internal Medicine

## 2022-07-01 ENCOUNTER — Encounter: Payer: Self-pay | Admitting: Internal Medicine

## 2022-07-01 VITALS — BP 126/74 | HR 47 | Temp 97.8°F | Ht 67.0 in | Wt 174.6 lb

## 2022-07-01 DIAGNOSIS — Z8619 Personal history of other infectious and parasitic diseases: Secondary | ICD-10-CM | POA: Diagnosis not present

## 2022-07-01 DIAGNOSIS — Z86711 Personal history of pulmonary embolism: Secondary | ICD-10-CM | POA: Diagnosis not present

## 2022-07-01 DIAGNOSIS — J4489 Other specified chronic obstructive pulmonary disease: Secondary | ICD-10-CM

## 2022-07-01 DIAGNOSIS — Z87891 Personal history of nicotine dependence: Secondary | ICD-10-CM | POA: Diagnosis not present

## 2022-07-01 NOTE — Progress Notes (Signed)
James Barker    [id number]    1950/07/01  Primary Care Physician:[name], Ann Maki, PA Date of Appointment: 07/01/2022 Established Patient Visit  Chief complaint:   Chief Complaint  Patient presents with   Hospitalization Follow-up    Wheezing and coughing with exertion      HPI: James Barker is a 72 y.o. man with history of seere allergic asthma COPD overlap syndrome.Dupixent started 04/26/2021.  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 He is on chemotherapy with this.   Interval Updates:  Here for follow up after hospitalization for severe asthma copd overlap syndrome exacerbation. Was rhinovirus positive.  Had night sweats in the hospital and was concerned about MAC since he has a history of this.  Short of breath with minimal exertion at home. Still having wheezing improved with albuterol. Started on budesonide in the hospital.   Current Regimen: spiriva, advair, mucinex,  prn albuterol usually takes 3-4 times/day. dupixent.  Asthma Triggers: allergies, viral infections.  Exacerbations in the last year: 2 History of hospitalization or intubation: yes in [place] Allergy Testing: sensitization to aspergillus in 2023 on immunocap testing GERD: denies Allergic Rhinitis: occasional ACT:  Asthma Control Test ACT Total Score  03/13/2022  1:35 PM 12  03/05/2022  8:31 AM 11  08/24/2021  9:56 AM 14   FeNO: 53 ppb   I have reviewed the patient's family social and past medical history and updated as appropriate.   Past Medical History:  Diagnosis Date   Anemia    Arthritis    Asthma-COPD overlap syndrome    pulmonology--- dr n Roanna Reaves;   last exacerbation 05-08-2021   Benign localized prostatic hyperplasia with lower urinary tract symptoms (LUTS)    Chronic cough    due to copd   Chronic kidney disease    Dairy product intolerance    DOE (dyspnea on exertion)    d/t asthma/ COPD   Dyspnea     Emphysema, unspecified (HCC)    Full dentures    History of bladder cancer 2020   s/p TURBT w/ chemo instillation   History of COVID-19    per pt 2021 (no at same time of pneumonia)  w/ moderate symptoms , recovered at home, that resolved back to baseline   History of hepatitis C 2018   hx chronic hepatitis C,  per pt treated and told was cured   History of latent tuberculosis 2017   pt tested positive for TB exposure, not active, treated w/ INH for 9 months   History of MAI infection 2021   never treated   History of pulmonary embolism    (05-09-2021  pt stated never had clot prior to 2017 , none since 2020, had negative blood disorder work-up)//  2017  treated w/ xarelto 6 months//  and recurrent 04/ 2020 in setting severe UTI and deconditioning w/ prolonged admission due to intolerant to anticoagulants IVC filter inserted   Hypertension    OSA (obstructive sleep apnea)    per pt dx yrs ago, intolerant to cpap   Pneumonia    Pulmonary eosinophilia (HCC)    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   Weak urinary stream    Wears glasses     Past Surgical History:  Procedure Laterality Date   ANAL FISTULECTOMY     x5  last one 2008   BRONCHOSCOPY  2021   per pt  had pneumonia   (done in [place])   INGUINAL HERNIA REPAIR Right 2010   IVC FILTER INSERTION  2020   in [place]   ORIF WRIST FRACTURE Left 2016   SHOULDER ARTHROSCOPY W/ ROTATOR CUFF REPAIR Left 2016   SHOULDER ARTHROSCOPY WITH ROTATOR CUFF REPAIR AND SUBACROMIAL DECOMPRESSION Left 05/09/2022   Procedure: SHOULDER ARTHROSCOPY WITH ROTATOR CUFF REPAIR AND SUBACROMIAL DECOMPRESSION;  Surgeon: Jones Broom, MD;  Location: [facility];  Service: Orthopedics;  Laterality: Left;   TRANSURETHRAL RESECTION OF BLADDER TUMOR  2020   in [place]   TRANSURETHRAL RESECTION OF PROSTATE  2021   in [place]   TRANSURETHRAL RESECTION OF PROSTATE N/A 05/11/2021   Procedure: PROSTATE EXAM UNDER ANESTHESIA; TRANSURETHRAL RESECTION OF THE PROSTATE (TURP), BLADDER BIOPSY;   Surgeon: Jerilee Field, MD;  Location: [facility];  Service: Urology;  Laterality: N/A;   UMBILICAL HERNIA REPAIR  2011    Family History  Problem Relation Age of Onset   Pulmonary embolism Mother    Emphysema Father    Lung cancer Sister    Asthma Child    Asthma Child     Social History   Occupational History   Not on file  Tobacco Use   Smoking status: Former    Packs/day: 1.00    Years: 20.00    Additional pack years: 0.00    Total pack years: 20.00    Types: Cigarettes    Quit date: 37    Years since quitting: 32.4   Smokeless tobacco: Never  Vaping Use   Vaping Use: Never used  Substance and Sexual Activity   Alcohol use: Not Currently   Drug use: Yes    Types: Marijuana    Comment: 05-09-2021  per pt smokes occasional   Sexual activity: Not on file     Physical Exam: Blood pressure 126/74, pulse (!) 47, temperature 97.8 F (36.6 C), temperature source Oral, height 5\' 7"  (1.702 m), weight 174 lb 9.6 oz (79.2 kg), SpO2 99 %.  Gen: no distress CV: RRR, no murmur Resp: no respiratory distress, mild wheezing best auscultated anterior neck  Data Reviewed: Imaging: I have personally reviewed the CTPE study June 2024 which is negative for PE, no lobar pneumonia, mild bibasilar atelectasis and 3mm right anterior lung nodule. [place]ath trachea  PFTs:     Latest Ref Rng & Units 02/20/2021    2:58 PM  PFT Results  FVC-Pre L 3.20   FVC-Predicted Pre % 84   FVC-Post L 3.30   FVC-Predicted Post % 87   Pre FEV1/FVC % % 49   Post FEV1/FCV % % 51   FEV1-Pre L 1.57   FEV1-Predicted Pre % 56   FEV1-Post L 1.70   DLCO uncorrected ml/min/mmHg 14.74   DLCO UNC% % 64   DLCO corrected ml/min/mmHg 14.74   DLCO COR %Predicted % 64   DLVA Predicted % 67   TLC L 6.54   TLC % Predicted % 105   RV % Predicted % 131    I have personally reviewed the patient's PFTs and moderately severe airflow limitation. Reduced diffusion  capacity  Labs: Rhinovirus positive June 2024 virus panel  Immunization status: Immunization History  Administered Date(s) Administered   Influenza, High Dose Seasonal PF 12/08/2020   Moderna Sars-Covid-2 Vaccination 03/25/2019, 04/22/2019, 09/30/2019   Pneumococcal-Unspecified 12/08/2020    External Records Personally Reviewed: pulmonary, hospital stay   Assessment:  Allergic Asthma COPD Overlap Syndrome on dupixent. 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 Continue advair, spiriva, and dupixent. Continue albuterol inhaler as needed. Continue eliquis.   We can talk about pulmonary rehab at your next visit, once you finish PT for your shoulder  I have reviewed your CT Chest from your hospitalization and it is reassuring. There is a small stable 3mm nodule.  I think your flare and hospitalization were caused by the rhinovirus.   Your sputum cultures in 2022 December were negative for MAC. However since you had night sweats, We will give you sputum cups today. Use your albuterol and nebulized saline to help with bring up sputum with flutter valve.   Overall his symptoms are much improved since starting dupixent. CT Chest shows some tracheomalacia exacerbated by Inhaled steroids and tobacco use (former.) his wheezing is best auscultated over his anterior neck. Would limit ICS as able and use pulmicort nebs during flare only. May need to evaluate for PAP therapy in future.    Return to Care: Return in about 3 months (around 10/01/2022).   Durel Salts, MD Pulmonary and Critical Care Medicine [facility] [phone]

## 2022-07-01 NOTE — Patient Instructions (Addendum)
Please schedule follow up scheduled with myself in 3 months.  If my schedule is not open yet, we will contact you with a reminder closer to that time. Please call [phone] if you haven't heard from [place] a month before.   Stop budesonide nebulizer treatments - I want you to use those only when you are feeling sick or having a flare. Continue advair, spiriva, and dupixent. Continue albuterol inhaler as needed. Continue eliquis.  We can talk about pulmonary rehab at your next visit, once you finish PT for your shoulder  I have reviewed your CT Chest from your hospitalization and it is reassuring. There is a small stable 3mm nodule.  I think your flare and hospitalization were caused by the rhinovirus.   Your sputum cultures in 2022 December were negative for MAC. However since you had night sweats, We will give you sputum cups today. Use your albuterol and nebulized saline to help with bring up sputum with flutter valve.  You can drop these off as below.    Instructions for getting a sputum sample:  Collect as soon as you wake up in the morning Do not eat or drink anything before collecting sample Only one sample per day per cup. Collect each sample in its own separate cups Only use cup care provider provided for you Do not use own plastic home containers Wash your hands before opening collection cup Do not take off lip until you are ready to place sample in cup  How to collect sputum sample 1. Loosen sputum Take 3 slow deep breath, so you can feel it from your stomach If you have a vest you may use this device to help break up sputum  2. Collect sample Wash your hands Take lid off Press rim of cup against lower lip  Take deep breath and hope for 2-3 seconds, then cough deeply by using stomach muscles to force out sputum. Make sure if comes from your stomach not your  not your throat or chest. GOAL: collect 3-83mL of sputum try to avoid saliva or spit  3. Refrigerate  sample Close container  Place date on label provided for you - should be in bag Put label on cup Put cup in bag provided to your from office Place in refrigerator right away. DO NOT FREEZE IT.  Bring your sample to the [address] location, unless instructed otherwise.   Reminders Patient should have 2 cups if collecting more than one order Obtain sample in the morning Return to our office within 4 hours of producing sample Produce enough sputum to reach the line marked by clinical staff  Make sure it is sputum and not spit  Any questions call the nearest office [place]- [phone] [place]- [phone] [place]- [phone]

## 2022-07-02 DIAGNOSIS — M75122 Complete rotator cuff tear or rupture of left shoulder, not specified as traumatic: Secondary | ICD-10-CM | POA: Diagnosis not present

## 2022-07-02 DIAGNOSIS — M25612 Stiffness of left shoulder, not elsewhere classified: Secondary | ICD-10-CM | POA: Diagnosis not present

## 2022-07-03 ENCOUNTER — Other Ambulatory Visit: Payer: 59

## 2022-07-03 DIAGNOSIS — Z8619 Personal history of other infectious and parasitic diseases: Secondary | ICD-10-CM | POA: Diagnosis not present

## 2022-07-03 DIAGNOSIS — J4489 Other specified chronic obstructive pulmonary disease: Secondary | ICD-10-CM | POA: Diagnosis not present

## 2022-07-09 DIAGNOSIS — M75122 Complete rotator cuff tear or rupture of left shoulder, not specified as traumatic: Secondary | ICD-10-CM | POA: Diagnosis not present

## 2022-07-09 DIAGNOSIS — M25612 Stiffness of left shoulder, not elsewhere classified: Secondary | ICD-10-CM | POA: Diagnosis not present

## 2022-07-15 DIAGNOSIS — M25612 Stiffness of left shoulder, not elsewhere classified: Secondary | ICD-10-CM | POA: Diagnosis not present

## 2022-07-15 DIAGNOSIS — M75122 Complete rotator cuff tear or rupture of left shoulder, not specified as traumatic: Secondary | ICD-10-CM | POA: Diagnosis not present

## 2022-07-18 DIAGNOSIS — M25612 Stiffness of left shoulder, not elsewhere classified: Secondary | ICD-10-CM | POA: Diagnosis not present

## 2022-07-18 DIAGNOSIS — M75122 Complete rotator cuff tear or rupture of left shoulder, not specified as traumatic: Secondary | ICD-10-CM | POA: Diagnosis not present

## 2022-07-22 DIAGNOSIS — M75122 Complete rotator cuff tear or rupture of left shoulder, not specified as traumatic: Secondary | ICD-10-CM | POA: Diagnosis not present

## 2022-07-22 DIAGNOSIS — M25612 Stiffness of left shoulder, not elsewhere classified: Secondary | ICD-10-CM | POA: Diagnosis not present

## 2022-07-23 ENCOUNTER — Other Ambulatory Visit (HOSPITAL_COMMUNITY): Payer: Self-pay

## 2022-07-24 DIAGNOSIS — M25612 Stiffness of left shoulder, not elsewhere classified: Secondary | ICD-10-CM | POA: Diagnosis not present

## 2022-07-24 DIAGNOSIS — M75122 Complete rotator cuff tear or rupture of left shoulder, not specified as traumatic: Secondary | ICD-10-CM | POA: Diagnosis not present

## 2022-07-29 ENCOUNTER — Other Ambulatory Visit (HOSPITAL_COMMUNITY): Payer: Self-pay

## 2022-07-31 DIAGNOSIS — M25612 Stiffness of left shoulder, not elsewhere classified: Secondary | ICD-10-CM | POA: Diagnosis not present

## 2022-07-31 DIAGNOSIS — M75122 Complete rotator cuff tear or rupture of left shoulder, not specified as traumatic: Secondary | ICD-10-CM | POA: Diagnosis not present

## 2022-08-07 LAB — AFB IDENTIFICATION BY PCR
M avium complex: NEGATIVE
M tuberculosis complex: NEGATIVE

## 2022-08-07 LAB — AFB CULTURE WITH SMEAR (NOT AT ARMC)
Acid Fast Culture: POSITIVE — AB
Acid Fast Smear: NEGATIVE

## 2022-08-07 LAB — ORGANISM ID BY MALDI

## 2022-08-08 ENCOUNTER — Telehealth: Payer: Self-pay | Admitting: Internal Medicine

## 2022-08-08 DIAGNOSIS — M25612 Stiffness of left shoulder, not elsewhere classified: Secondary | ICD-10-CM | POA: Diagnosis not present

## 2022-08-08 DIAGNOSIS — M75122 Complete rotator cuff tear or rupture of left shoulder, not specified as traumatic: Secondary | ICD-10-CM | POA: Diagnosis not present

## 2022-08-08 DIAGNOSIS — A310 Pulmonary mycobacterial infection: Secondary | ICD-10-CM

## 2022-08-08 NOTE — Telephone Encounter (Signed)
Patient would like results of sputum culture. Patient phone number is [phone].

## 2022-08-09 NOTE — Telephone Encounter (Signed)
Dr. Celine Mans can you please advise on labs

## 2022-08-13 DIAGNOSIS — H9193 Unspecified hearing loss, bilateral: Secondary | ICD-10-CM | POA: Diagnosis not present

## 2022-08-14 ENCOUNTER — Other Ambulatory Visit (HOSPITAL_COMMUNITY): Payer: Self-pay

## 2022-08-14 DIAGNOSIS — M75122 Complete rotator cuff tear or rupture of left shoulder, not specified as traumatic: Secondary | ICD-10-CM | POA: Diagnosis not present

## 2022-08-14 DIAGNOSIS — M25612 Stiffness of left shoulder, not elsewhere classified: Secondary | ICD-10-CM | POA: Diagnosis not present

## 2022-08-21 ENCOUNTER — Other Ambulatory Visit: Payer: 59

## 2022-08-21 ENCOUNTER — Other Ambulatory Visit: Payer: Self-pay

## 2022-08-21 ENCOUNTER — Other Ambulatory Visit: Payer: Self-pay | Admitting: Internal Medicine

## 2022-08-21 DIAGNOSIS — S81802A Unspecified open wound, left lower leg, initial encounter: Secondary | ICD-10-CM | POA: Diagnosis not present

## 2022-08-21 DIAGNOSIS — R051 Acute cough: Secondary | ICD-10-CM

## 2022-08-21 DIAGNOSIS — L03116 Cellulitis of left lower limb: Secondary | ICD-10-CM | POA: Diagnosis not present

## 2022-08-21 DIAGNOSIS — S9002XA Contusion of left ankle, initial encounter: Secondary | ICD-10-CM | POA: Diagnosis not present

## 2022-08-23 DIAGNOSIS — M25612 Stiffness of left shoulder, not elsewhere classified: Secondary | ICD-10-CM | POA: Diagnosis not present

## 2022-08-23 DIAGNOSIS — M75122 Complete rotator cuff tear or rupture of left shoulder, not specified as traumatic: Secondary | ICD-10-CM | POA: Diagnosis not present

## 2022-08-26 ENCOUNTER — Other Ambulatory Visit (HOSPITAL_COMMUNITY): Payer: Self-pay

## 2022-08-26 ENCOUNTER — Other Ambulatory Visit: Payer: Self-pay

## 2022-08-27 DIAGNOSIS — S81802D Unspecified open wound, left lower leg, subsequent encounter: Secondary | ICD-10-CM | POA: Diagnosis not present

## 2022-08-28 DIAGNOSIS — M25612 Stiffness of left shoulder, not elsewhere classified: Secondary | ICD-10-CM | POA: Diagnosis not present

## 2022-08-28 DIAGNOSIS — M75122 Complete rotator cuff tear or rupture of left shoulder, not specified as traumatic: Secondary | ICD-10-CM | POA: Diagnosis not present

## 2022-08-30 ENCOUNTER — Other Ambulatory Visit: Payer: Self-pay

## 2022-08-30 DIAGNOSIS — M25612 Stiffness of left shoulder, not elsewhere classified: Secondary | ICD-10-CM | POA: Diagnosis not present

## 2022-08-30 DIAGNOSIS — M75122 Complete rotator cuff tear or rupture of left shoulder, not specified as traumatic: Secondary | ICD-10-CM | POA: Diagnosis not present

## 2022-09-02 DIAGNOSIS — M25512 Pain in left shoulder: Secondary | ICD-10-CM | POA: Diagnosis not present

## 2022-09-04 DIAGNOSIS — Z8551 Personal history of malignant neoplasm of bladder: Secondary | ICD-10-CM | POA: Diagnosis not present

## 2022-09-05 ENCOUNTER — Encounter: Payer: Self-pay | Admitting: Infectious Diseases

## 2022-09-05 ENCOUNTER — Ambulatory Visit: Payer: 59 | Admitting: Infectious Diseases

## 2022-09-05 ENCOUNTER — Other Ambulatory Visit: Payer: Self-pay

## 2022-09-05 VITALS — BP 132/85 | HR 55 | Resp 16 | Ht 67.0 in | Wt 174.0 lb

## 2022-09-05 DIAGNOSIS — A319 Mycobacterial infection, unspecified: Secondary | ICD-10-CM | POA: Diagnosis not present

## 2022-09-05 DIAGNOSIS — Z114 Encounter for screening for human immunodeficiency virus [HIV]: Secondary | ICD-10-CM | POA: Diagnosis not present

## 2022-09-05 DIAGNOSIS — R61 Generalized hyperhidrosis: Secondary | ICD-10-CM | POA: Diagnosis not present

## 2022-09-05 NOTE — Progress Notes (Addendum)
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 30 tablet 1   atorvastatin (LIPITOR) 20 MG tablet Take 20 mg by mouth daily.     budesonide (PULMICORT) 0.25 MG/2ML nebulizer solution Use 2 mLs (0.25 mg) by nebulization 2 times daily. 60 mL 12   Dupilumab (DUPIXENT) 300 MG/2ML SOPN Inject 300 mg into the skin every 14 days. 12 mL 3   ELIQUIS 5 MG TABS tablet TAKE 1 TABLET BY MOUTH TWICE A DAY 60 tablet 2   guaiFENesin (MUCINEX) 600 MG 12 hr tablet Take 1 tablet (600 mg total) by mouth 2 (two) times daily. 60 tablet 1   HYDROcodone-acetaminophen (NORCO/VICODIN) 5-325 MG tablet Take 1 tablet by mouth every 4 (four) hours as needed for moderate pain. 30 tablet 0   ondansetron (ZOFRAN-ODT) 4 MG disintegrating tablet 4mg  ODT q4 hours prn nausea/vomit (Patient taking differently: Take 4 mg by mouth every 8 (eight) hours as needed for nausea or vomiting. 4mg  ODT q4 hours prn nausea/vomit) 10 tablet 0   sodium chloride 0.9 % nebulizer solution Take 3 mLs by nebulization 3 (three) times daily as needed for wheezing. 90 mL 12   Tiotropium Bromide Monohydrate (SPIRIVA RESPIMAT) 2.5 MCG/ACT AERS INHALE 2 PUFFS BY MOUTH INTO THE LUNGS DAILY (Patient taking  differently: Inhale 2 each into the lungs daily.) 4 g 11   ADVAIR HFA 230-21 MCG/ACT inhaler INHALE 2 PUFFS INTO THE LUNGS TWICE A DAY (Patient taking differently: Inhale 2 puffs into the lungs 2 (two) times daily.) 12 each 12   albuterol (PROVENTIL) (2.5 MG/3ML) 0.083% nebulizer solution Take 3 mLs (2.5 mg total) by nebulization every 6 (six) hours as needed for wheezing or shortness of breath. 225 mL 3   albuterol (VENTOLIN HFA) 108 (90 Base) MCG/ACT inhaler TAKE 2 PUFFS BY MOUTH EVERY 6 HOURS AS NEEDED FOR WHEEZE OR SHORTNESS OF BREATH (Patient taking differently: Inhale 1 puff into the lungs every 6 (six) hours as needed for shortness of breath.) 8.5 each 5   No current facility-administered medications on file prior to visit.   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   Reports having pulmonary MAC 4 years ago when he was in [place].  He was seen by infectious disease at that time and was decided not to be treated.  He however reports having night sweats on and off for few months, not every single day but once or twice in a week with ? Subjective chills.  Denies having any fevers.  He  reports having shortness of breath and cough for years due to asthma/COPD.  Cough is chronic and mostly dry but occasionally with thick sputum.  He also has chronic shortness of breath but is able to go to gym and walk for 1 hour in a treadmill usually.  He is able to do ADLs in his house like cooking, cleaning, laundry and grocery.  He lives with his ex-wife and 2 adult children.  He reports having diarrhea due to IBS but denies nausea vomiting or abdominal pain.  He is appetite is okay and thinks he is gaining weight.  Quit smoking 30 years ago smokes marijuana edible in a while denies any  alcohol and IVDU.  He was treated for latent tuberculosis as well as hepatitis C in the past and was told to be cured.  He was admitted in June 2024 for exacerbation of asthma/copd with rhino virus infection. Completed course of PO azithromycin. He was seen by Dr Luciana Axe in 12/2020 for Pulmonary MAI infection as well as acinetobacter with no indication to treat.   Review of Systems: Denies rashes or GU symptoms. Denies headache, neck pain, focal weakness, numbness. Denis any lumps/bumps or lymph node swelling   Past Medical History:  Diagnosis Date   Anemia    Arthritis    Asthma-COPD overlap syndrome    pulmonology--- dr n desai;   last exacerbation 05-08-2021   Benign localized prostatic hyperplasia with lower urinary tract symptoms (LUTS)    Chronic cough    due to copd   Chronic kidney disease    Dairy product intolerance    DOE (dyspnea on exertion)    d/t asthma/ COPD   Dyspnea    Emphysema, unspecified (HCC)    Full dentures    History of bladder cancer 2020   s/p TURBT w/ chemo instillation   History of COVID-19    per pt 2021 (no at same time of pneumonia)  w/ moderate symptoms , recovered at home, that resolved back to baseline   History of hepatitis C 2018   hx chronic hepatitis C,  per pt treated and told was cured   History of latent tuberculosis 2017   pt tested positive for TB exposure, not active, treated w/ INH for 9 months   History of MAI infection 2021   never treated   History of pulmonary embolism    (05-09-2021  pt stated never had clot prior to 2017 , none since 2020, had negative blood disorder work-up)//  2017  treated w/ xarelto 6 months//  and recurrent 04/ 2020 in setting severe UTI and deconditioning w/ prolonged admission due to intolerant to anticoagulants IVC filter inserted   Hypertension    OSA (obstructive sleep apnea)    per pt dx yrs ago, intolerant to cpap   Pneumonia    Pulmonary eosinophilia (HCC)    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   Weak  urinary stream    Wears glasses    Past Surgical History:  Procedure Laterality Date   ANAL FISTULECTOMY     x5  last one 2008   BRONCHOSCOPY  2021   per pt had pneumonia   (done in [place])   INGUINAL HERNIA REPAIR Right 2010   IVC FILTER INSERTION  2020   in [place]   ORIF WRIST FRACTURE Left 2016   SHOULDER ARTHROSCOPY W/ ROTATOR CUFF REPAIR Left 2016   SHOULDER ARTHROSCOPY WITH ROTATOR CUFF REPAIR AND SUBACROMIAL DECOMPRESSION Left 05/09/2022   Procedure: SHOULDER  ARTHROSCOPY WITH ROTATOR CUFF REPAIR AND SUBACROMIAL DECOMPRESSION;  Surgeon: Jones Broom, MD;  Location: [facility];  Service: Orthopedics;  Laterality: Left;   TRANSURETHRAL RESECTION OF BLADDER TUMOR  2020   in [place]   TRANSURETHRAL RESECTION OF PROSTATE  2021   in [place]   TRANSURETHRAL RESECTION OF PROSTATE N/A 05/11/2021   Procedure: PROSTATE EXAM UNDER ANESTHESIA; TRANSURETHRAL RESECTION OF THE PROSTATE (TURP), BLADDER BIOPSY;  Surgeon: Jerilee Field, MD;  Location[facility];  Service: Urology;  Laterality: N/A;   UMBILICAL HERNIA REPAIR  2011    Social History   Tobacco Use   Smoking status: Former    Current packs/day: 0.00    Average packs/day: 1 pack/day for 20.0 years (20.0 ttl pk-yrs)    Types: Cigarettes    Start date: 58    Quit date: 8    Years since quitting: 32.6   Smokeless tobacco: Never  Vaping Use   Vaping status: Never Used  Substance Use Topics   Alcohol use: Not Currently   Drug use: Yes    Types: Marijuana    Comment: 05-09-2021  per pt smokes occasional    Family History  Problem Relation Age of Onset   Pulmonary embolism Mother    Emphysema Father    Lung cancer Sister    Asthma Child    Asthma Child     Allergies  Allergen Reactions   Cheese Nausea And Vomiting   Lactose Intolerance (Gi) Diarrhea and Nausea And Vomiting    Health Maintenance  Topic Date Due   Hepatitis C Screening  Never done   DTaP/Tdap/Td (1 - Tdap) Never done   Zoster Vaccines-  Shingrix (1 of 2) Never done   Colonoscopy  Never done   Pneumonia Vaccine 61+ Years old (1 of 1 - PCV) 03/21/2015   COVID-19 Vaccine (4 - 2023-24 season) 09/21/2021   INFLUENZA VACCINE  08/22/2022   Medicare Annual Wellness (AWV)  12/19/2022   HPV VACCINES  Aged Out    Objective:  Vitals:   09/05/22 0851  Resp: 16  Height: 5\' 7"  (1.702 m)   Body mass index is 27.35 kg/m.  Physical Exam Constitutional:      Appearance: elderly male sitting in the chair  HENT:     Head: Normocephalic and atraumatic.      Mouth: Mucous membranes are moist.  Eyes:    Conjunctiva/sclera: Conjunctivae normal.     Pupils: Pupils are equal, round, and bilaterally symmetrical   Cardiovascular:     Rate and Rhythm: Normal rate and regular rhythm.     Heart sounds: s1s2  Pulmonary:     Effort: Pulmonary effort is normal on RA    Breath sounds: decreased bilateral air entry and wheeze throughout all lung fields   Abdominal:     General: Non distended     Palpations:   Musculoskeletal:        General: Normal range of motion.   Skin:    General: Skin is warm and dry.     Comments:  Neurological:     General: grossly non focal     Mental Status: awake, alert and oriented to person, place, and time.   Psychiatric:        Mood and Affect: Mood normal.   Lab Results Lab Results  Component Value Date   WBC 14.7 (H) 06/26/2022   HGB 12.7 (L) 06/26/2022   HCT 38.8 (L) 06/26/2022   MCV 88.4 06/26/2022   PLT 238 06/26/2022  Lab Results  Component Value Date   CREATININE 1.21 06/26/2022   BUN 32 (H) 06/26/2022   NA 136 06/26/2022   K 4.9 06/26/2022   CL 105 06/26/2022   CO2 22 06/26/2022    Lab Results  Component Value Date   ALT 15 06/26/2022   AST 22 06/26/2022   ALKPHOS 55 06/26/2022   BILITOT 0.5 06/26/2022    No results found for: "CHOL", "HDL", "LDLCALC", "LDLDIRECT", "TRIG", "CHOLHDL" No results found for: "LABRPR", "RPRTITER" No results found for: "HIV1RNAQUANT",  "HIV1RNAVL", "CD4TABS"   Microbiology Results for orders placed or performed in visit on 07/03/22  AFB Culture & Smear     Status: Abnormal   Collection Time: 07/03/22 12:00 AM  Result Value Ref Range Status   AFB Specimen Processing Concentration  Final   Acid Fast Smear Negative  Final   Acid Fast Culture Positive (A)  Final    Comment: Acid-fast bacilli have been detected in culture at 3 weeks. Further identification to follow.   AFB Identification by PCR     Status: None   Collection Time: 07/03/22 12:00 AM  Result Value Ref Range Status   M tuberculosis complex Negative  Final   M avium complex Negative  Final   Reflex ID by MALDI See Reflex  Final   Other: CANCELED      Comment: No additional identification testing is necessary.  Result canceled by the ancillary.   Organism ID by MALDI     Status: Abnormal   Collection Time: 07/03/22 12:00 AM  Result Value Ref Range Status   Organism ID by MALDI Comment (A)  Final    Comment: Mycobacterium kumamotonense MALDI TOF analysis was performed using FDA cleared reagents, VITEK MS, and database from [employer]. Characteristics of this test were determined by [facility]. If clinical considerations warrant susceptibility testing, contact the Microbiology Department.    Imaging CT angio chest 06/24/22 IMPRESSION: 1. Negative for pulmonary embolism. 2. Mucous plugging and/or bronchial wall thickening in the right lower lobe. 3. Centrilobular emphysema with areas of atelectasis in the lower lobes. 4. 3 mm nodule in the anterior right lung is likely benign based on the stability. 5.  Emphysema (ICD10-J43.9).  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 3 cups provided to submit early morning sputum sample for AFB smear and cultures   2 sets of peripheral blood cultures given night sweats. I also discussed him to fu with PCP for age based ca screening  HIV for screening, he agreed  Fu in a month   I have personally spent 66 minutes involved in face-to-face and non-face-to-face activities for this patient on the day of the visit. Professional time spent includes the following activities: Preparing to see the patient (review of tests), Obtaining and/or reviewing separately obtained history (admission/discharge record), Performing a medically appropriate examination and/or evaluation , Ordering medications/tests/procedures, referring and communicating with other health care professionals, Documenting clinical information in the EMR, Independently interpreting results (not separately reported), Communicating results to the patient/family/caregiver, Counseling and educating the patient/family/caregiver and Care coordination (not separately reported).   Victoriano Lain, MD [facility] 09/05/2022, 8:51 AM

## 2022-09-09 ENCOUNTER — Other Ambulatory Visit: Payer: Self-pay

## 2022-09-09 ENCOUNTER — Encounter: Payer: Self-pay | Admitting: Infectious Diseases

## 2022-09-09 ENCOUNTER — Other Ambulatory Visit: Payer: 59

## 2022-09-09 DIAGNOSIS — R61 Generalized hyperhidrosis: Secondary | ICD-10-CM | POA: Insufficient documentation

## 2022-09-09 DIAGNOSIS — A319 Mycobacterial infection, unspecified: Secondary | ICD-10-CM | POA: Insufficient documentation

## 2022-09-09 DIAGNOSIS — Z114 Encounter for screening for human immunodeficiency virus [HIV]: Secondary | ICD-10-CM | POA: Insufficient documentation

## 2022-09-10 ENCOUNTER — Other Ambulatory Visit: Payer: 59

## 2022-09-10 ENCOUNTER — Other Ambulatory Visit: Payer: Self-pay

## 2022-09-10 DIAGNOSIS — A319 Mycobacterial infection, unspecified: Secondary | ICD-10-CM | POA: Diagnosis not present

## 2022-09-10 LAB — CULTURE, BLOOD (SINGLE)
MICRO NUMBER:: 15336826
MICRO NUMBER:: 15336827
Result:: NO GROWTH
Result:: NO GROWTH
SPECIMEN QUALITY:: ADEQUATE
SPECIMEN QUALITY:: ADEQUATE

## 2022-09-10 LAB — HIV ANTIBODY (ROUTINE TESTING W REFLEX): HIV 1&2 Ab, 4th Generation: NONREACTIVE

## 2022-09-11 ENCOUNTER — Telehealth: Payer: Self-pay

## 2022-09-11 NOTE — Telephone Encounter (Signed)
-----   Message from Victoriano Lain sent at 09/11/2022 12:21 PM EDT ----- Please let him know HIV is negative as well as blood cultures are negative.

## 2022-09-17 ENCOUNTER — Other Ambulatory Visit: Payer: Self-pay | Admitting: Internal Medicine

## 2022-09-19 ENCOUNTER — Other Ambulatory Visit: Payer: Self-pay | Admitting: Family Medicine

## 2022-09-19 ENCOUNTER — Other Ambulatory Visit (HOSPITAL_COMMUNITY): Payer: Self-pay

## 2022-09-19 DIAGNOSIS — R61 Generalized hyperhidrosis: Secondary | ICD-10-CM

## 2022-09-19 DIAGNOSIS — Z8551 Personal history of malignant neoplasm of bladder: Secondary | ICD-10-CM

## 2022-09-25 DIAGNOSIS — H40013 Open angle with borderline findings, low risk, bilateral: Secondary | ICD-10-CM | POA: Diagnosis not present

## 2022-09-25 DIAGNOSIS — H35033 Hypertensive retinopathy, bilateral: Secondary | ICD-10-CM | POA: Diagnosis not present

## 2022-09-25 DIAGNOSIS — H43812 Vitreous degeneration, left eye: Secondary | ICD-10-CM | POA: Diagnosis not present

## 2022-09-25 DIAGNOSIS — H25813 Combined forms of age-related cataract, bilateral: Secondary | ICD-10-CM | POA: Diagnosis not present

## 2022-09-25 DIAGNOSIS — H524 Presbyopia: Secondary | ICD-10-CM | POA: Diagnosis not present

## 2022-10-07 ENCOUNTER — Telehealth: Payer: Self-pay

## 2022-10-07 NOTE — Telephone Encounter (Signed)
Patient is asking about his sputum culture results. Syre Knerr T Pricilla Loveless

## 2022-10-08 ENCOUNTER — Ambulatory Visit
Admission: RE | Admit: 2022-10-08 | Discharge: 2022-10-08 | Disposition: A | Discharge status: Home / Self Care | Payer: 59 | Source: Ambulatory Visit | Attending: Family Medicine | Admitting: Family Medicine

## 2022-10-08 DIAGNOSIS — R61 Generalized hyperhidrosis: Secondary | ICD-10-CM

## 2022-10-08 DIAGNOSIS — Z8551 Personal history of malignant neoplasm of bladder: Secondary | ICD-10-CM | POA: Diagnosis not present

## 2022-10-08 DIAGNOSIS — K8020 Calculus of gallbladder without cholecystitis without obstruction: Secondary | ICD-10-CM | POA: Diagnosis not present

## 2022-10-08 MED ORDER — IOPAMIDOL (ISOVUE-370) INJECTION 76%
500.0000 mL | Freq: Once | INTRAVENOUS | Status: AC | PRN
  Administered 2022-10-08: 80 mL via INTRAVENOUS

## 2022-10-15 ENCOUNTER — Ambulatory Visit (INDEPENDENT_AMBULATORY_CARE_PROVIDER_SITE_OTHER): Payer: 59 | Admitting: Internal Medicine

## 2022-10-15 ENCOUNTER — Other Ambulatory Visit: Payer: Self-pay

## 2022-10-15 ENCOUNTER — Encounter: Payer: Self-pay | Admitting: Internal Medicine

## 2022-10-15 VITALS — BP 121/76 | HR 61 | Temp 97.9°F | Ht 67.0 in | Wt 175.0 lb

## 2022-10-15 DIAGNOSIS — A319 Mycobacterial infection, unspecified: Secondary | ICD-10-CM | POA: Diagnosis not present

## 2022-10-15 NOTE — Progress Notes (Signed)
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   Encounter for screening for HIV 09/09/2022   Chronic night sweats 09/09/2022   Acute bronchitis due to Rhinovirus 06/26/2022   COPD exacerbation (HCC) 06/24/2022   Bladder cancer (HCC) 06/06/2021   Insomnia 05/17/2021   CKD-3A 05/17/2021   Gross hematuria 05/16/2021   Bilateral pulmonary embolism (HCC) 05/15/2021   History of bladder cancer s/p TURBT and Chemo in [place] 05/15/2021   Presence of IVC filter 05/15/2021   BPH (benign prostatic hyperplasia) 05/15/2021   Chest pain 05/15/2021   Asthma with COPD with exacerbation (HCC) 04/13/2021   Eosinophilia 04/13/2021   Hypertension 04/13/2021   Allergic Asthma COPD Overlap Syndrome 01/02/2021     Subjective:  James Barker is a 72 y.o. M with PMHx as below presents for management for pulmonary NTM infection. Since last visit with Dr. Zigmund Daniel on 09/05/22 pt has submitted 3 sputums for NTM testing. Today 10/15/22: Reports cough is about the same. Night sweats persist on and off for which he saw primary, CT pending as part of work up. 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    Reports having pulmonary MAC 4 years ago when he was in [place].  He was seen by infectious disease at that time and was decided not to be treated.  He however reports having night sweats on and off for few months, not every single day but once or twice in a week with ? Subjective chills.  Denies having any  fevers.  He reports having shortness of breath and cough for years due to asthma/COPD.  Cough is chronic and mostly dry but occasionally with thick sputum.  He also has chronic shortness of breath but is able to go to gym and walk for 1 hour in a treadmill usually.  He is able to do ADLs in his house like cooking, cleaning, laundry and grocery.  He lives with his ex-wife and 2 adult children.  He reports having diarrhea due to IBS but denies nausea vomiting or abdominal pain.  He is appetite is okay and thinks he is gaining weight.  Quit smoking 30 years ago smokes marijuana edible in a while denies any alcohol and IVDU.  He was treated for latent tuberculosis as well as hepatitis C in the past and was told to be cured.   He was admitted in June 2024 for exacerbation of asthma/copd with rhino virus infection. Completed course of PO azithromycin. He was seen by Dr Luciana Axe in 12/2020 for Pulmonary MAI infection as well as acinetobacter with no indication to treat. " Review of Systems  All other systems reviewed and are negative.   Past Medical History:  Diagnosis Date   Anemia    Arthritis    Asthma-COPD overlap syndrome    pulmonology--- dr n desai;   last exacerbation 05-08-2021   Benign localized prostatic hyperplasia with lower urinary tract symptoms (LUTS)    Chronic  cough    due to copd   Chronic kidney disease    Dairy product intolerance    DOE (dyspnea on exertion)    d/t asthma/ COPD   Dyspnea    Emphysema, unspecified (HCC)    Full dentures    History of bladder cancer 2020   s/p TURBT w/ chemo instillation   History of COVID-19    per pt 2021 (no at same time of pneumonia)  w/ moderate symptoms , recovered at home, that resolved back to baseline   History of hepatitis C 2018   hx chronic hepatitis C,  per pt treated and told was cured   History of latent tuberculosis 2017   pt tested positive for TB exposure, not active, treated w/ INH for 9 months   History of MAI infection  2021   never treated   History of pulmonary embolism    (05-09-2021  pt stated never had clot prior to 2017 , none since 2020, had negative blood disorder work-up)//  2017  treated w/ xarelto 6 months//  and recurrent 04/ 2020 in setting severe UTI and deconditioning w/ prolonged admission due to intolerant to anticoagulants IVC filter inserted   Hypertension    OSA (obstructive sleep apnea)    per pt dx yrs ago, intolerant to cpap   Pneumonia    Pulmonary eosinophilia (HCC)    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   Weak urinary stream    Wears glasses     Outpatient Medications Prior to Visit  Medication Sig Dispense Refill   ADVAIR HFA 230-21 MCG/ACT inhaler INHALE 2 PUFFS INTO THE LUNGS TWICE A DAY (Patient taking differently: Inhale 2 puffs into the lungs 2 (two) times daily.) 12 each 12   albuterol (PROVENTIL) (2.5 MG/3ML) 0.083% nebulizer solution Take 3 mLs (2.5 mg total) by nebulization every 6 (six) hours as needed for wheezing or shortness of breath. 225 mL 3   albuterol (VENTOLIN HFA) 108 (90 Base) MCG/ACT inhaler TAKE 2 PUFFS BY MOUTH EVERY 6 HOURS AS NEEDED FOR WHEEZE OR SHORTNESS OF BREATH 8.5 each 5   amLODipine (NORVASC) 10 MG tablet Take 1 tablet (10 mg total) by mouth daily. 30 tablet 1   atorvastatin (LIPITOR) 20 MG tablet Take 20 mg by mouth daily.     budesonide (PULMICORT) 0.25 MG/2ML nebulizer solution Use 2 mLs (0.25 mg) by nebulization 2 times daily. 60 mL 12   Dupilumab (DUPIXENT) 300 MG/2ML SOPN Inject 300 mg into the skin every 14 days. 12 mL 3   ELIQUIS 5 MG TABS tablet TAKE 1 TABLET BY MOUTH TWICE A DAY 60 tablet 2   ondansetron (ZOFRAN-ODT) 4 MG disintegrating tablet 4mg  ODT q4 hours prn nausea/vomit (Patient taking differently: Take 4 mg by mouth every 8 (eight) hours as needed for nausea or vomiting. 4mg  ODT q4 hours prn nausea/vomit) 10 tablet 0   sodium chloride 0.9 % nebulizer solution Take 3 mLs by nebulization 3 (three) times daily as needed for wheezing. 90 mL  12   Tiotropium Bromide Monohydrate (SPIRIVA RESPIMAT) 2.5 MCG/ACT AERS INHALE 2 PUFFS BY MOUTH INTO THE LUNGS DAILY (Patient taking differently: Inhale 2 each into the lungs daily.) 4 g 11   guaiFENesin (MUCINEX) 600 MG 12 hr tablet Take 1 tablet (600 mg total) by mouth 2 (two) times daily. (Patient not taking: Reported on 10/15/2022) 60 tablet 1   HYDROcodone-acetaminophen (NORCO/VICODIN) 5-325 MG tablet Take 1 tablet by mouth every 4 (four) hours as needed  for moderate pain. (Patient not taking: Reported on 10/15/2022) 30 tablet 0   No facility-administered medications prior to visit.     Allergies  Allergen Reactions   Cheese Nausea And Vomiting   Lactose Intolerance (Gi) Diarrhea and Nausea And Vomiting    Social History   Tobacco Use   Smoking status: Former    Current packs/day: 0.00    Average packs/day: 1 pack/day for 20.0 years (20.0 ttl pk-yrs)    Types: Cigarettes    Start date: 25    Quit date: 9    Years since quitting: 32.7   Smokeless tobacco: Never  Vaping Use   Vaping status: Never Used  Substance Use Topics   Alcohol use: Not Currently   Drug use: Yes    Types: Marijuana    Comment: 05-09-2021  per pt smokes occasional    Family History  Problem Relation Age of Onset   Pulmonary embolism Mother    Emphysema Father    Lung cancer Sister    Asthma Child    Asthma Child     Objective:   Vitals:   10/15/22 0927  BP: 121/76  Pulse: 61  Temp: 97.9 F (36.6 C)  TempSrc: Temporal  SpO2: 95%  Weight: 175 lb (79.4 kg)  Height: 5\' 7"  (1.702 m)   Body mass index is 27.41 kg/m.  Physical Exam Constitutional:      General: He is not in acute distress.    Appearance: He is normal weight. He is not toxic-appearing.  HENT:     Head: Normocephalic and atraumatic.     Right Ear: External ear normal.     Left Ear: External ear normal.     Nose: No congestion or rhinorrhea.     Mouth/Throat:     Mouth: Mucous membranes are moist.     Pharynx:  Oropharynx is clear.  Eyes:     Extraocular Movements: Extraocular movements intact.     Conjunctiva/sclera: Conjunctivae normal.     Pupils: Pupils are equal, round, and reactive to light.  Cardiovascular:     Rate and Rhythm: Normal rate and regular rhythm.     Heart sounds: No murmur heard.    No friction rub. No gallop.  Pulmonary:     Effort: Pulmonary effort is normal.     Breath sounds: Normal breath sounds.  Abdominal:     General: Abdomen is flat. Bowel sounds are normal.     Palpations: Abdomen is soft.  Musculoskeletal:        General: No swelling. Normal range of motion.     Cervical back: Normal range of motion and neck supple.  Skin:    General: Skin is warm and dry.  Neurological:     General: No focal deficit present.     Mental Status: He is oriented to person, place, and time.  Psychiatric:        Mood and Affect: Mood normal.     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 Cough is about the same. CT AP pending(pt states PCP is w/u night sweats) -09/09/22 -8/20 mycobacterial smear negative x 3, cx NG so far Plan:  -Follow up sputum Cx -F/U Dr. Elinor Parkinson on 01/27/22  Danelle Earthly, MD [facility]   10/15/22  9:30 AM   I have personally spent 65 minutes involved in  face-to-face and non-face-to-face activities for this patient on the day of the visit. Professional time spent includes the following activities: Preparing to see the patient (review of tests), Obtaining and/or reviewing separately obtained history (admission/discharge record), Performing a medically appropriate examination and/or evaluation , Ordering medications/tests/procedures, referring and communicating with other health care professionals, Documenting clinical information in the EMR, Independently interpreting results (not separately reported), Communicating results to the patient/family/caregiver, Counseling and educating the patient/family/caregiver and Care coordination (not separately reported).  e

## 2022-10-17 ENCOUNTER — Other Ambulatory Visit: Payer: Self-pay

## 2022-10-17 ENCOUNTER — Other Ambulatory Visit (HOSPITAL_COMMUNITY): Payer: Self-pay

## 2022-10-17 NOTE — Progress Notes (Signed)
Specialty Pharmacy Refill Coordination Note  James Barker is a 72 y.o. male contacted today regarding refills of specialty medication(s) Dupilumab .  Patient requested Delivery  on 10/30/22  to verified address Patient address [address]  [place] [address]   Medication will be filled on 10/29/22.    Specialty Pharmacy Ongoing Clinical Assessment Note  James Barker is a 72 y.o. male who is being followed by the specialty pharmacy service for RxSp Asthma/COPD   Review of patient's specialty medication(s) Dupilumab  occurred today.   Patient has missed 0  doses in the last 4 weeks.   Patient did not have any additional questions or concerns.   Therapeutic benefit summary: Patient is achieving benefit   Adverse events/side effects summary: No adverse events/side effects   Patient's therapy is appropriate to : Continue    Goals      Minimize recurrence of flares     Patient is on track. Patient will maintain adherence         Follow up:  6 months

## 2022-10-22 ENCOUNTER — Other Ambulatory Visit: Payer: Self-pay

## 2022-10-23 ENCOUNTER — Ambulatory Visit: Payer: Self-pay | Admitting: Infectious Diseases

## 2022-10-24 LAB — MYCOBACTERIA,CULT W/FLUOROCHROME SMEAR
MICRO NUMBER:: 15360517
SMEAR:: NONE SEEN
SPECIMEN QUALITY:: ADEQUATE

## 2022-10-28 LAB — MYCOBACTERIA,CULT W/FLUOROCHROME SMEAR
MICRO NUMBER:: 15354762
MICRO NUMBER:: 15354763
SMEAR:: NONE SEEN
SMEAR:: NONE SEEN
SPECIMEN QUALITY:: ADEQUATE
SPECIMEN QUALITY:: ADEQUATE

## 2022-10-30 ENCOUNTER — Telehealth: Payer: Self-pay

## 2022-10-30 NOTE — Telephone Encounter (Signed)
-----   Message from Victoriano Lain sent at 10/30/2022  8:06 AM EDT ----- Please inform patient sputum cultures were negative for any growth, nothing to do.  Will fu next visit in January.

## 2022-11-20 ENCOUNTER — Other Ambulatory Visit (HOSPITAL_COMMUNITY): Payer: Self-pay | Admitting: Pharmacy Technician

## 2022-11-20 ENCOUNTER — Other Ambulatory Visit (HOSPITAL_COMMUNITY): Payer: Self-pay

## 2022-11-20 ENCOUNTER — Other Ambulatory Visit: Payer: Self-pay | Admitting: Internal Medicine

## 2022-11-20 DIAGNOSIS — D721 Eosinophilia, unspecified: Secondary | ICD-10-CM

## 2022-11-20 DIAGNOSIS — J4489 Other specified chronic obstructive pulmonary disease: Secondary | ICD-10-CM

## 2022-11-20 NOTE — Progress Notes (Signed)
Specialty Pharmacy Refill Coordination Note  James Barker is a 72 y.o. male contacted today regarding refills of specialty medication(s) Dupilumab   Patient requested Delivery   Delivery date: 11/28/22   Verified address: [address] [place]   Medication will be filled on 11/27/22.  Refill request sent to MD; call if any delays.

## 2022-11-22 ENCOUNTER — Other Ambulatory Visit: Payer: Self-pay | Admitting: Internal Medicine

## 2022-11-22 ENCOUNTER — Other Ambulatory Visit: Payer: Self-pay

## 2022-11-22 ENCOUNTER — Other Ambulatory Visit (HOSPITAL_COMMUNITY): Payer: Self-pay

## 2022-11-22 DIAGNOSIS — D721 Eosinophilia, unspecified: Secondary | ICD-10-CM

## 2022-11-22 DIAGNOSIS — J4489 Other specified chronic obstructive pulmonary disease: Secondary | ICD-10-CM

## 2022-11-27 ENCOUNTER — Other Ambulatory Visit: Payer: Self-pay

## 2022-11-27 ENCOUNTER — Telehealth: Payer: Self-pay | Admitting: Internal Medicine

## 2022-11-27 DIAGNOSIS — H905 Unspecified sensorineural hearing loss: Secondary | ICD-10-CM | POA: Diagnosis not present

## 2022-11-27 DIAGNOSIS — D721 Eosinophilia, unspecified: Secondary | ICD-10-CM

## 2022-11-27 DIAGNOSIS — J4489 Other specified chronic obstructive pulmonary disease: Secondary | ICD-10-CM

## 2022-11-28 ENCOUNTER — Other Ambulatory Visit: Payer: Self-pay

## 2022-11-29 ENCOUNTER — Other Ambulatory Visit: Payer: Self-pay

## 2022-11-29 MED ORDER — DUPIXENT 300 MG/2ML ~~LOC~~ SOAJ
300.0000 mg | SUBCUTANEOUS | 0 refills | Status: DC
Start: 2022-11-29 — End: 2023-02-17
  Filled 2022-11-29: qty 4, 28d supply, fill #0
  Filled 2022-12-18: qty 4, 28d supply, fill #1
  Filled 2023-01-16: qty 4, 28d supply, fill #2

## 2022-11-29 NOTE — Telephone Encounter (Signed)
Refill sent for DUPIXENT to [facility]: [phone]   Dose: 300mg  SQ every 14 days  Last OV: 07/01/2022 Provider: Dr. Celine Mans  Next OV: not schedled and overdue  Routing to scheduling team for follow-up on appt scheduling  Chesley Mires, PharmD, MPH, BCPS Clinical Pharmacist (Rheumatology and Pulmonology)

## 2022-11-29 NOTE — Progress Notes (Signed)
Refill received. LVM for patient with updated delivery date. Mail 11/11.

## 2022-12-01 ENCOUNTER — Other Ambulatory Visit: Payer: Self-pay | Admitting: Internal Medicine

## 2022-12-02 ENCOUNTER — Other Ambulatory Visit: Payer: Self-pay

## 2022-12-03 IMAGING — CT CT ABD-PELV W/ CM
2 of 5 series · 16 of 46 positions shown, 18 images · IV contrast (agent unspecified)
Comparison: 02/22/2021

CLINICAL DATA: Ileus versus small-bowel obstruction

EXAM:
CT ABDOMEN AND PELVIS WITH CONTRAST
TECHNIQUE: Multidetector CT imaging of the abdomen and pelvis was performed
using the standard protocol following bolus administration of
intravenous contrast.

[Series 4: abd pel w · axial · 0.81mm/px · z∈[+758,+1168]mm · 13 of 94 slices shown, 15 images]
[im 6/94  soft-tissue]
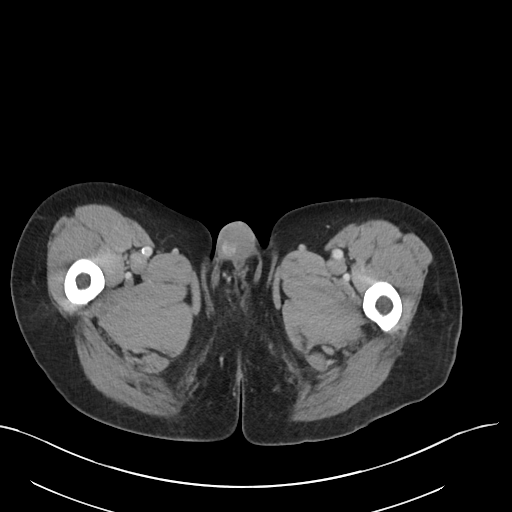
[im 6/94  bone]
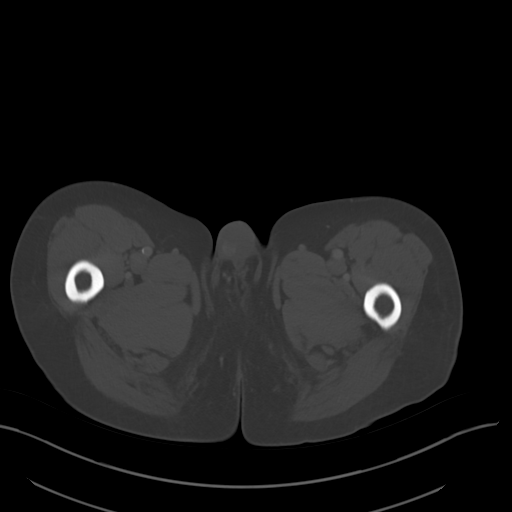
[im 11/94  soft-tissue]
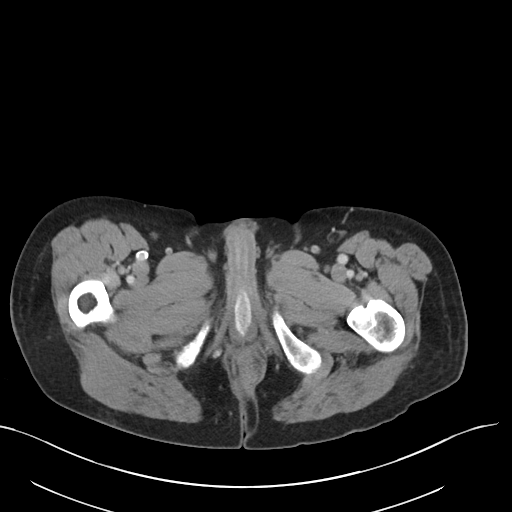
[im 21/94  soft-tissue]
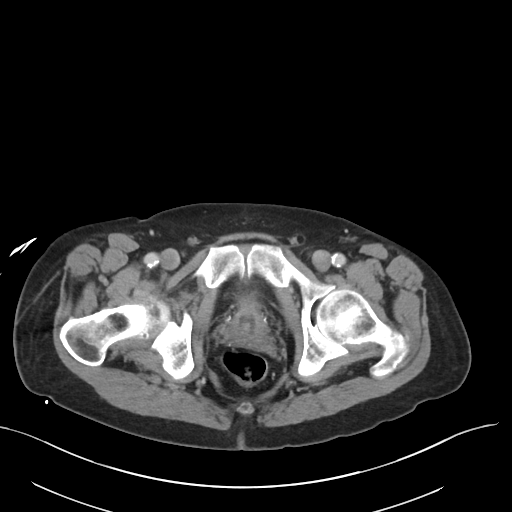
[im 26/94  soft-tissue]
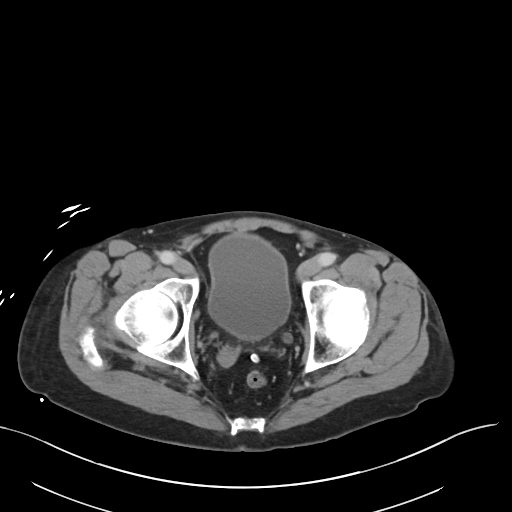
[im 32/94  soft-tissue]
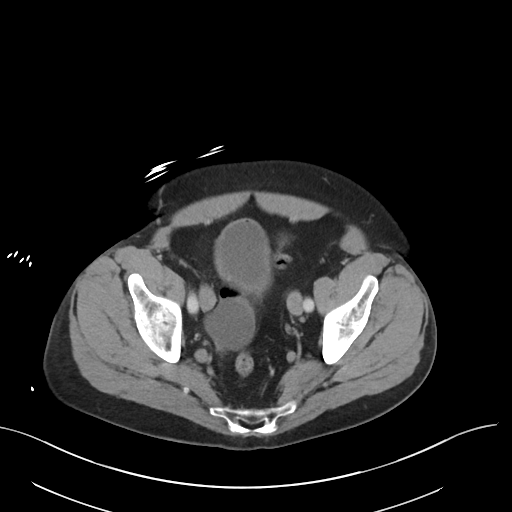
[im 42/94  soft-tissue]
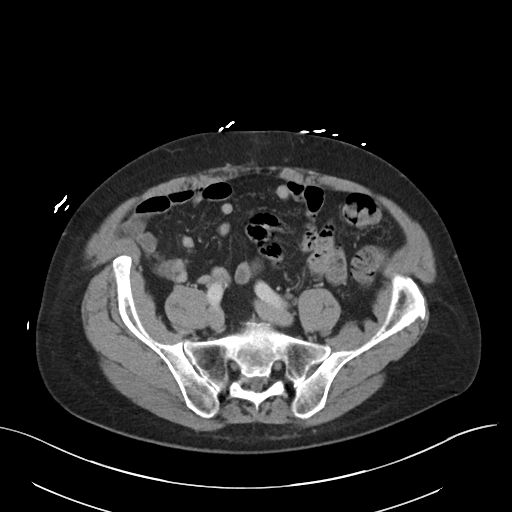
[im 47/94  soft-tissue]
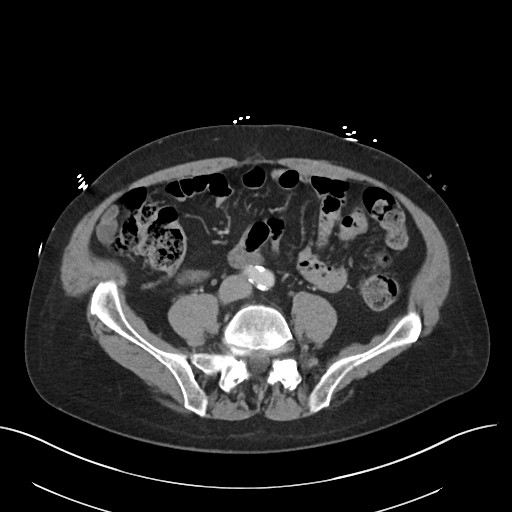
[im 52/94  soft-tissue]
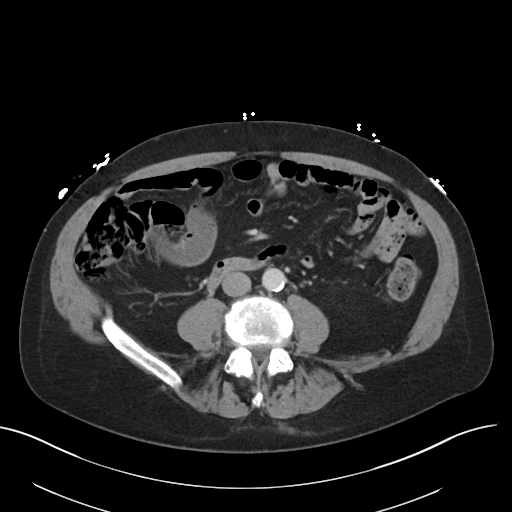
[im 63/94  soft-tissue]
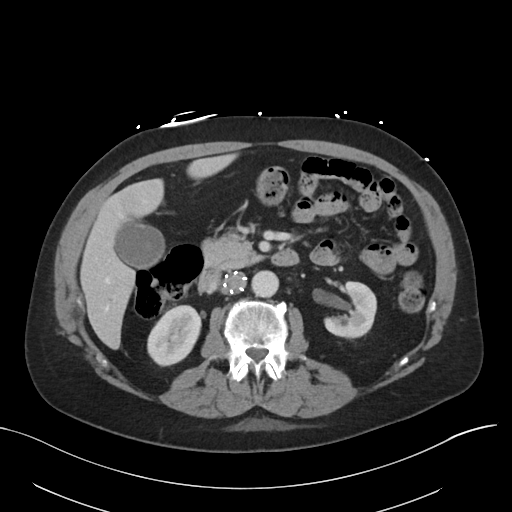
[im 63/94  bone]
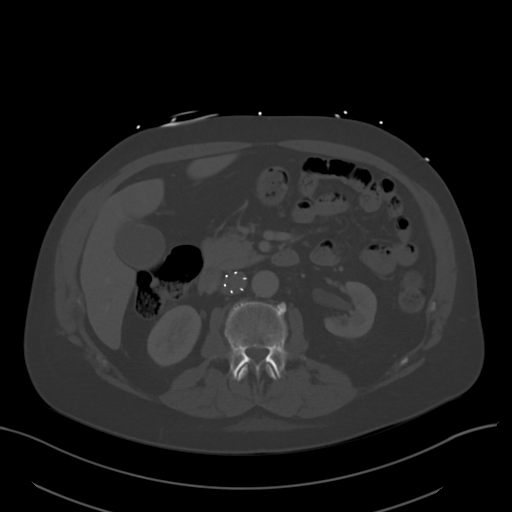
[im 68/94  soft-tissue]
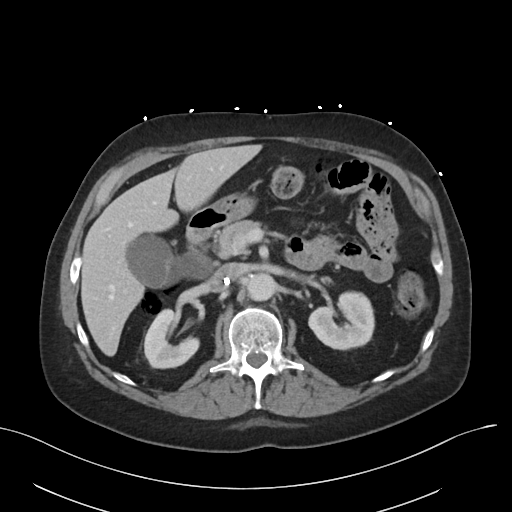
[im 73/94  soft-tissue]
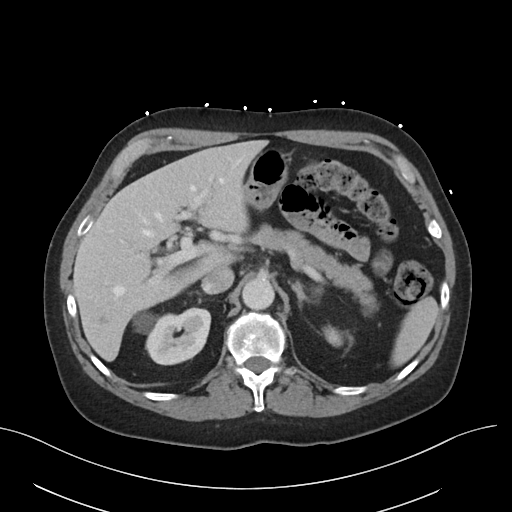
[im 83/94  soft-tissue]
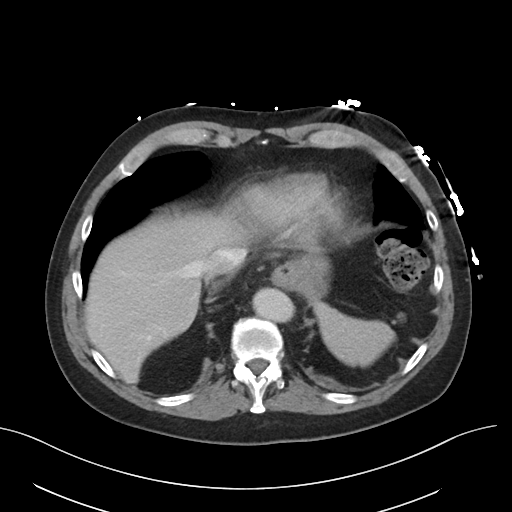
[im 88/94  soft-tissue]
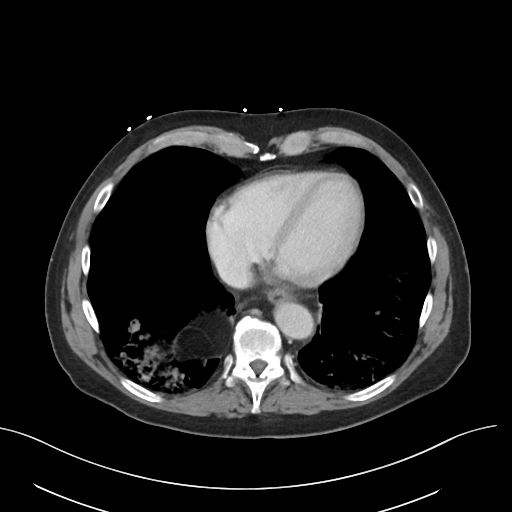

[Series 7: coronal · coronal · 0.71mm/px · 3 of 98 slices shown]
[im 33/98  soft-tissue]
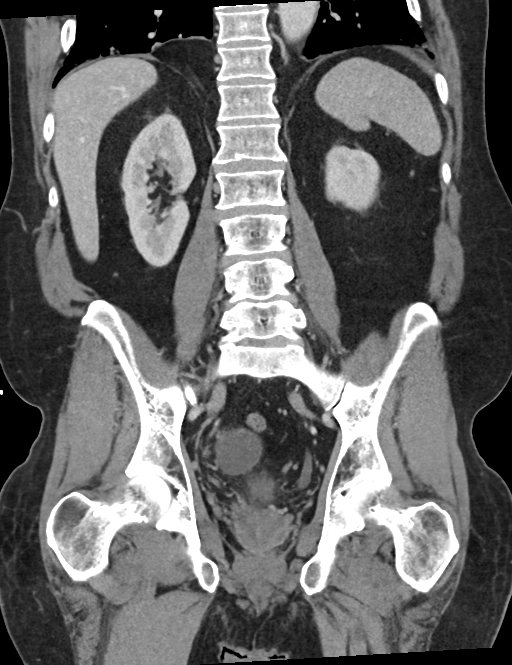
[im 44/98  soft-tissue]
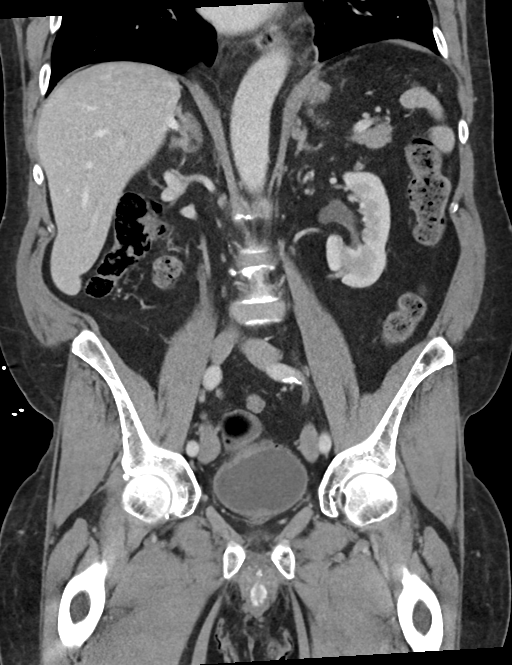
[im 54/98  soft-tissue]
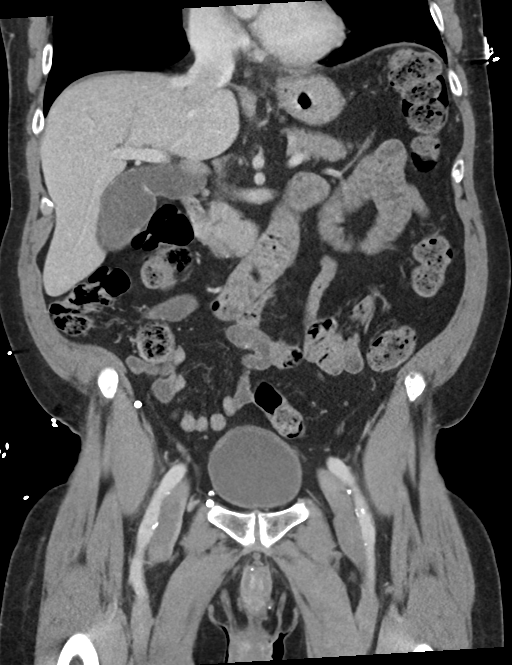

[16 of 46 positions shown; findings below may reference images not displayed]

RADIATION DOSE REDUCTION: This exam was performed according to the
departmental dose-optimization program which includes automated
exposure control, adjustment of the mA and/or kV according to
patient size and/or use of iterative reconstruction technique.

CONTRAST:  75mL OMNIPAQUE IOHEXOL 350 MG/ML SOLN
FINDINGS: Lower chest: Please see separately reported examination of the
chest. Small hiatal hernia.

Hepatobiliary: No solid liver abnormality is seen. No gallstones,
gallbladder wall thickening, or biliary dilatation.

Pancreas: Unremarkable. No pancreatic ductal dilatation or
surrounding inflammatory changes.

Spleen: Normal in size without significant abnormality.

Adrenals/Urinary Tract: Adrenal glands are unremarkable. Simple
benign exophytic cyst of the anterior midportion of the right
kidney, for which no further follow-up or characterization is
required (series 4, image 24). Kidneys are otherwise normal, without
renal calculi, solid lesion, or hydronephrosis. Large diverticulum
of the posterior bladder dome containing a small air-fluid level
(series 8, image 64).

Stomach/Bowel: Stomach is within normal limits. Appendix appears
normal. No evidence of bowel wall thickening, distention, or
inflammatory changes.

Vascular/Lymphatic: Aortic atherosclerosis. Infrarenal IVC filter.
No enlarged abdominal or pelvic lymph nodes.

Reproductive: Prostatomegaly with TURP defect.

Other: No abdominal wall hernia or abnormality. No ascites.

Musculoskeletal: No acute or significant osseous findings.
IMPRESSION: 1. No evidence of bowel obstruction. No acute findings within the
abdomen or pelvis.
2. Large diverticulum of the posterior bladder dome containing a
small air-fluid level, presumably secondary to recent
catheterization.
3. Prostatomegaly with TURP defect.

Aortic Atherosclerosis (0D9K1-ILE.E).

## 2022-12-03 IMAGING — CT CT ANGIO CHEST
2 of 7 series · 17 of 46 positions shown · IV contrast (agent unspecified)
Comparison: None.

CLINICAL DATA: Rule out PE, status post cystoscopy, TURP, and
bladder biopsy

EXAM:
CT ANGIOGRAPHY CHEST WITH CONTRAST
TECHNIQUE: Multidetector CT imaging of the chest was performed using the
standard protocol during bolus administration of intravenous
contrast. Multiplanar CT image reconstructions and MIPs were
obtained to evaluate the vascular anatomy.

[Series 3: pe axial thins · axial · 0.70mm/px · z∈[+1109,+1345]mm · 14 of 274 slices shown]
[im 19/274  lung]
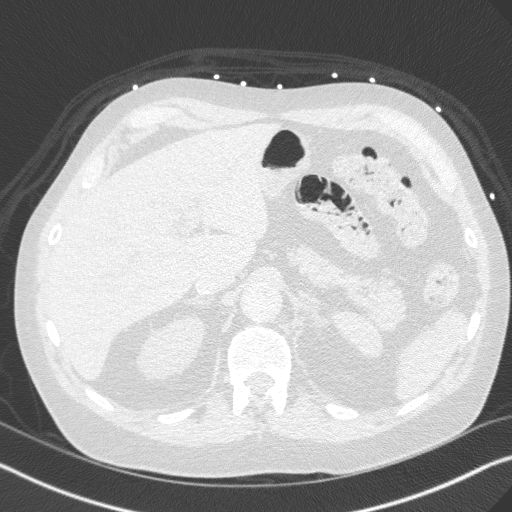
[im 37/274  soft-tissue]
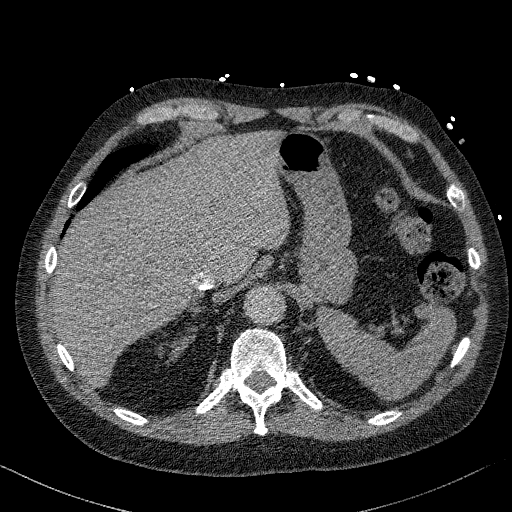
[im 55/274  lung]
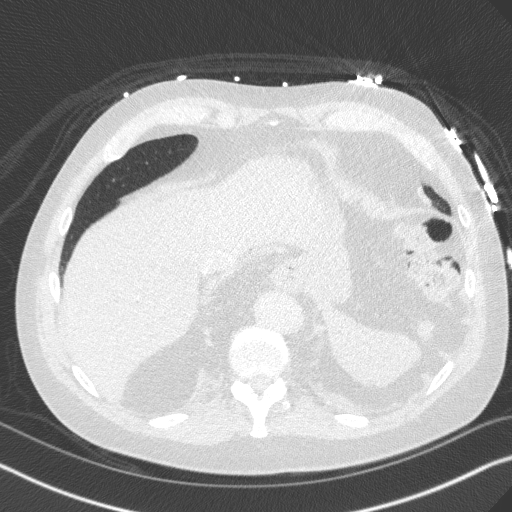
[im 73/274  soft-tissue]
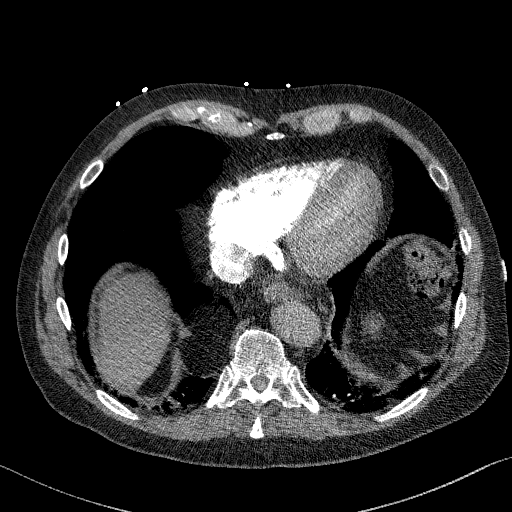
[im 92/274  lung]
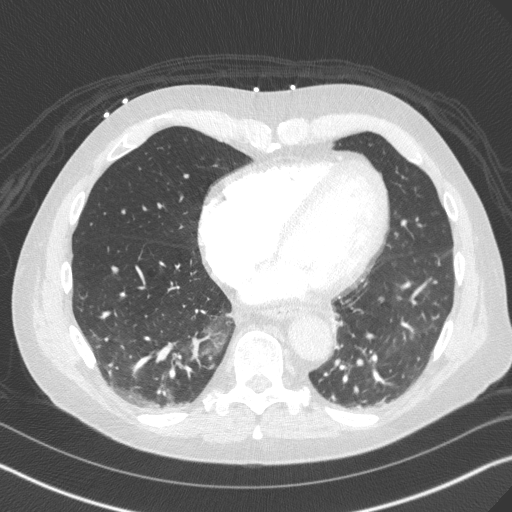
[im 110/274  soft-tissue]
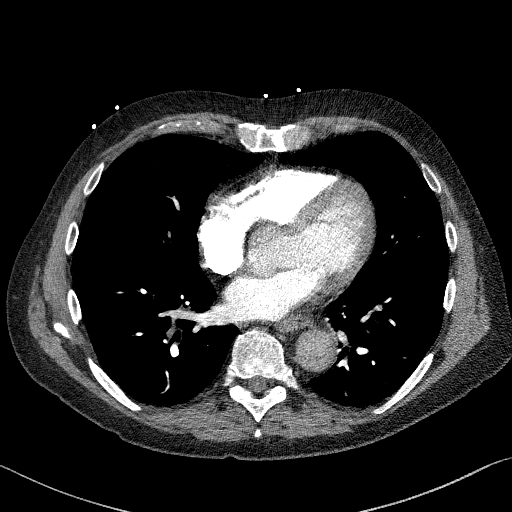
[im 128/274  lung]
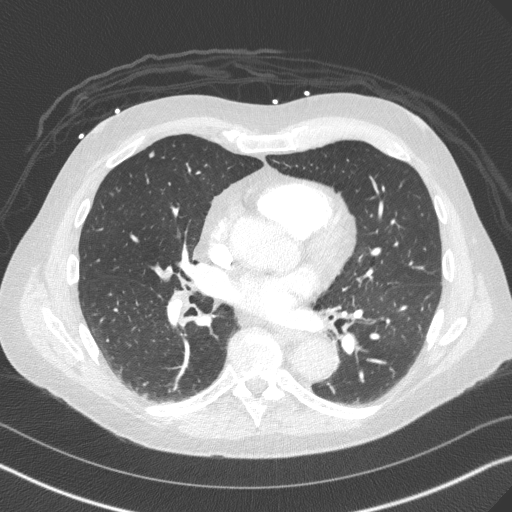
[im 146/274  soft-tissue]
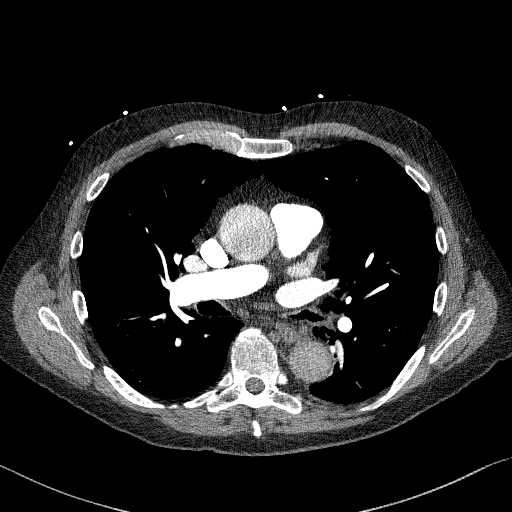
[im 164/274  lung]
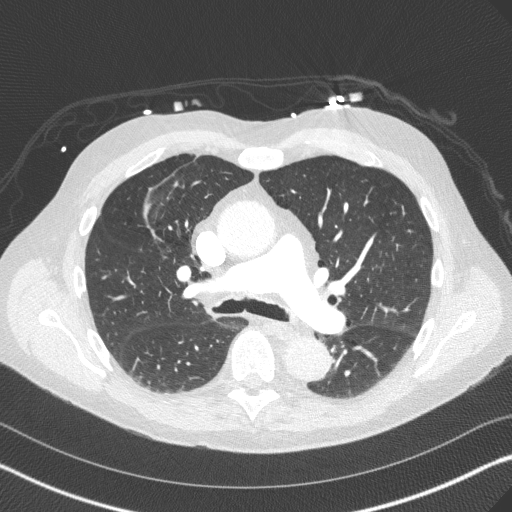
[im 183/274  soft-tissue]
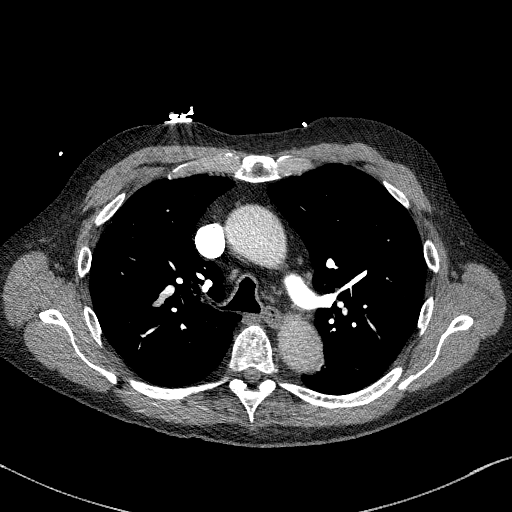
[im 201/274  lung]
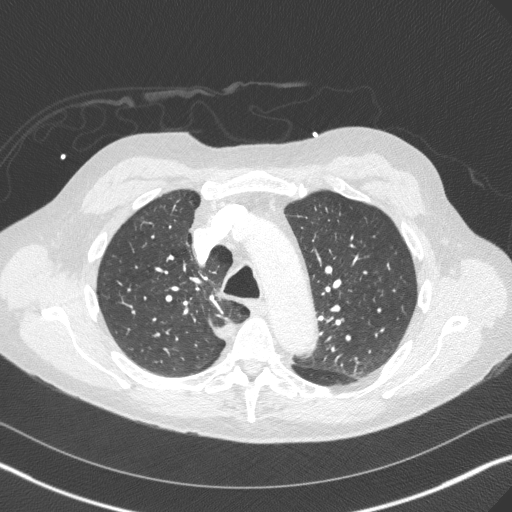
[im 219/274  soft-tissue]
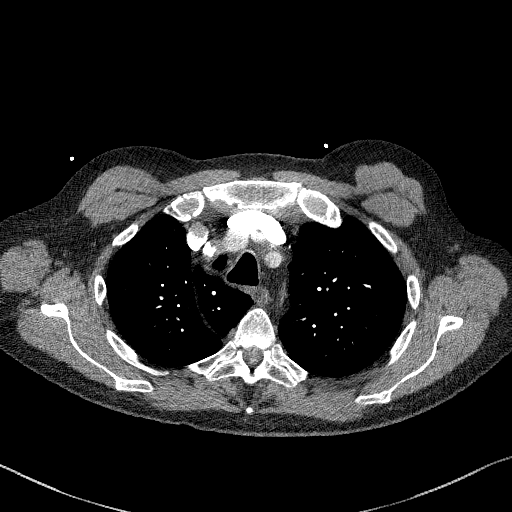
[im 237/274  lung]
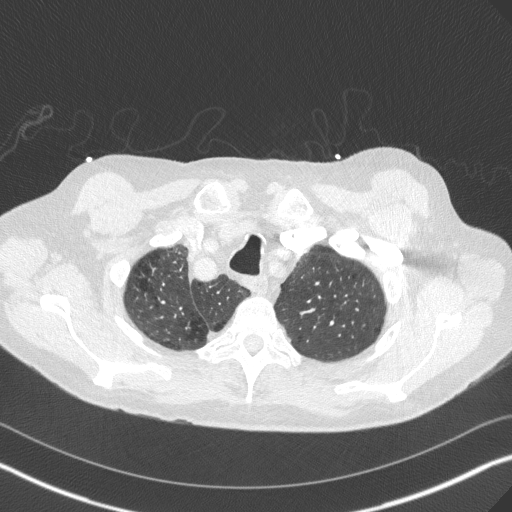
[im 255/274  soft-tissue]
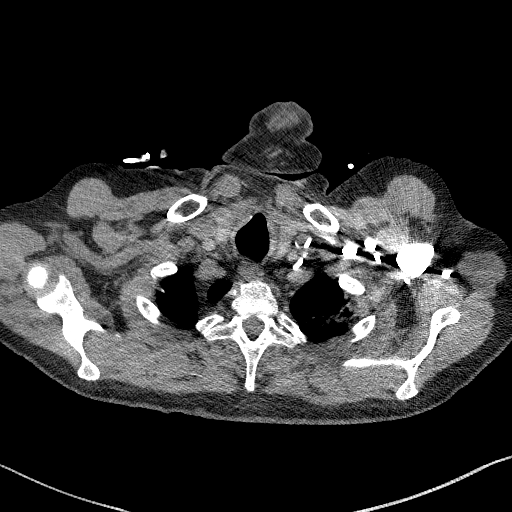

[Series 5: cor soft · coronal · 0.55mm/px · 3 of 144 slices shown]
[im 36/144  soft-tissue]
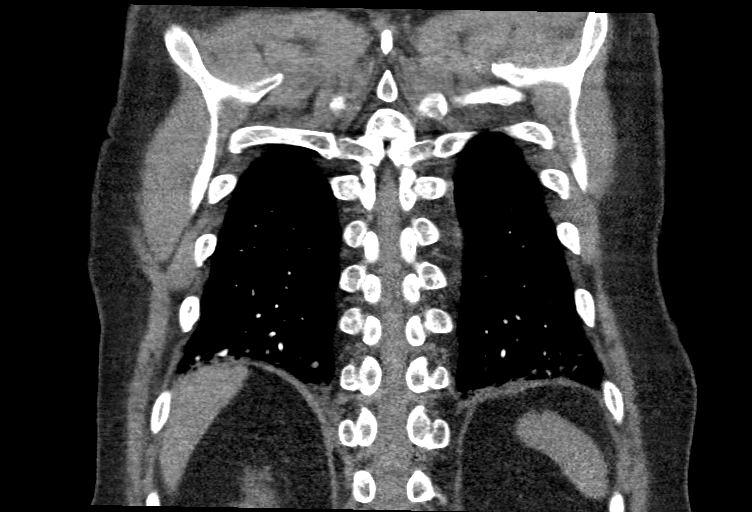
[im 72/144  soft-tissue]
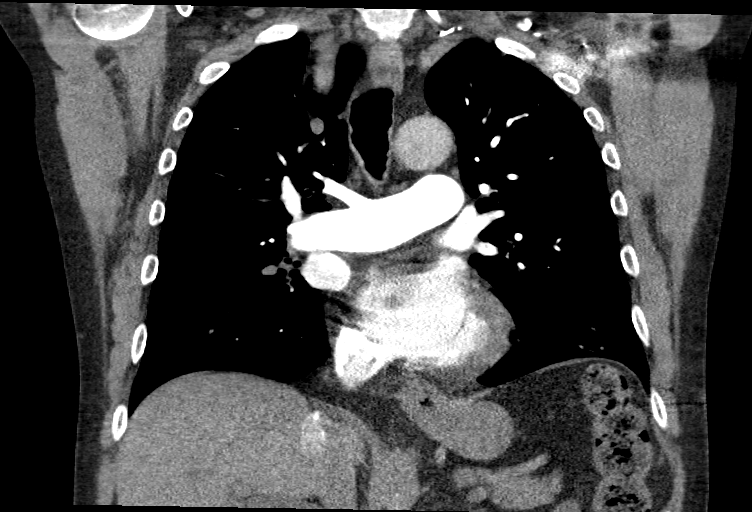
[im 108/144  soft-tissue]
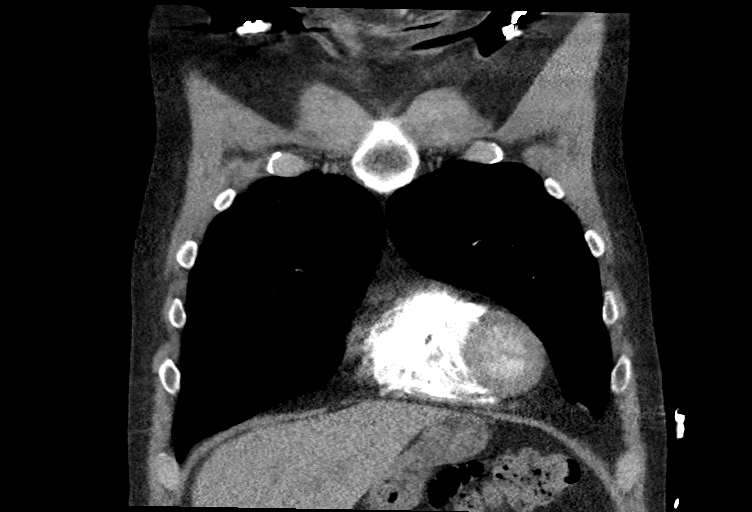

[17 of 46 positions shown; findings below may reference images not displayed]

RADIATION DOSE REDUCTION: This exam was performed according to the
departmental dose-optimization program which includes automated
exposure control, adjustment of the mA and/or kV according to
patient size and/or use of iterative reconstruction technique.

CONTRAST:  75mL OMNIPAQUE IOHEXOL 350 MG/ML SOLN
FINDINGS: Cardiovascular: Satisfactory opacification of the pulmonary arteries
to the segmental level. Positive examination for pulmonary embolism,
with segmental to subsegmental embolus present in the right upper
lobe, right lower lobe, and left lower lobe. Normal heart size. No
pericardial effusion. Coronary artery calcifications. Aortic
atherosclerosis.

Mediastinum/Nodes: No enlarged mediastinal, hilar, or axillary lymph
nodes. Thyroid gland, trachea, and esophagus demonstrate no
significant findings.

Lungs/Pleura: Mild centrilobular and paraseptal emphysema. Diffuse
bilateral bronchial wall thickening. Heterogeneous airspace opacity
of the bilateral lung bases. No pleural effusion or pneumothorax.

Upper Abdomen: Please see separately reported examination of the
abdomen.

Musculoskeletal: No chest wall abnormality. No acute osseous
findings.

Review of the MIP images confirms the above findings.
IMPRESSION: 1. Positive examination for pulmonary embolism, with segmental to
subsegmental embolus present in the right upper lobe, right lower
lobe, and left lower lobe.
2. Heterogeneous airspace opacity of the bilateral lung bases,
consistent with infection or aspiration.
3. Diffuse bilateral bronchial wall thickening, consistent with
nonspecific infectious or inflammatory bronchitis.
4. Emphysema.

These results were called by telephone at the time of interpretation
on 05/15/2021 at [DATE] to Dr. PLASTER JORI , who verbally
acknowledged these results.

Aortic Atherosclerosis (4RBBX-ABL.L) and Emphysema (4RBBX-M9V.7).

## 2022-12-05 IMAGING — US US RENAL
1 series · 14 of 25 positions shown · non-contrast
Comparison: CT 05/15/2021

CLINICAL DATA: Gross hematuria

EXAM:
RENAL / URINARY TRACT ULTRASOUND COMPLETE

[Series 1: us renal · 14 of 66 slices shown]
[im 1/66]
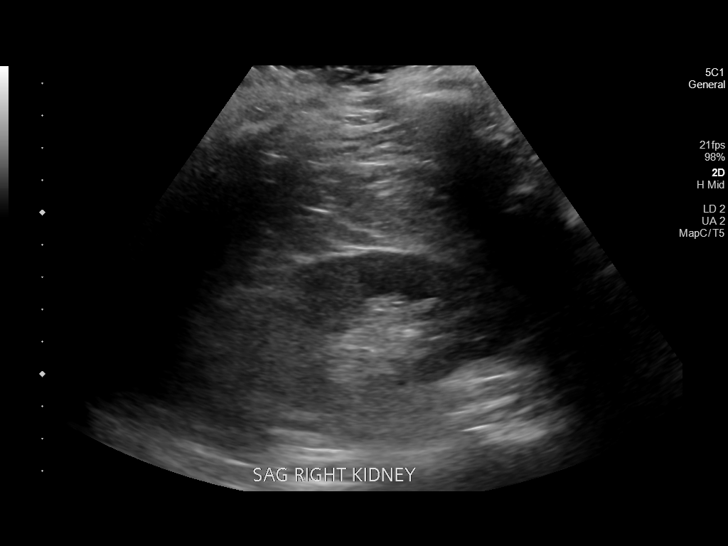
[im 6/66]
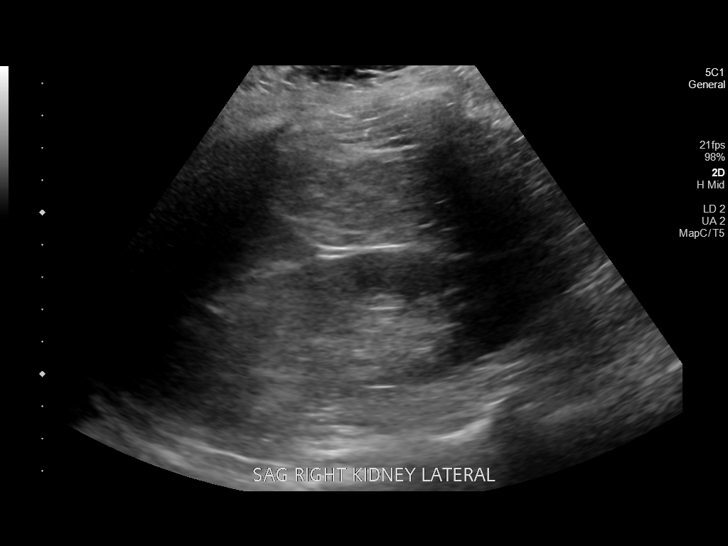
[im 11/66]
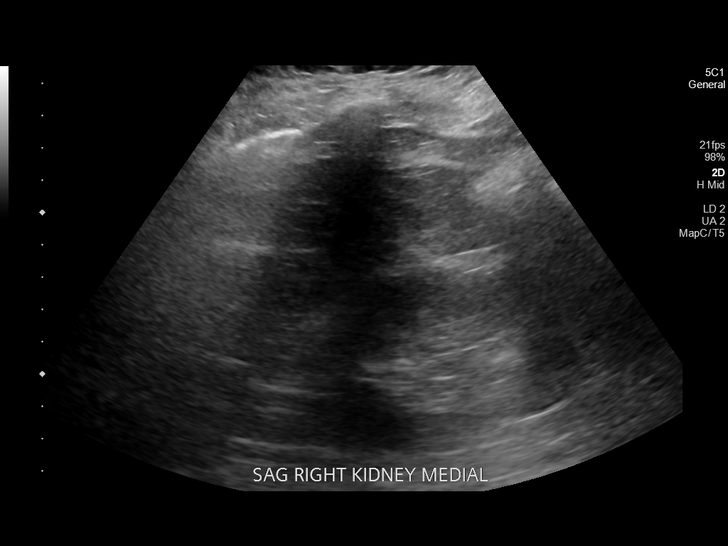
[im 17/66]
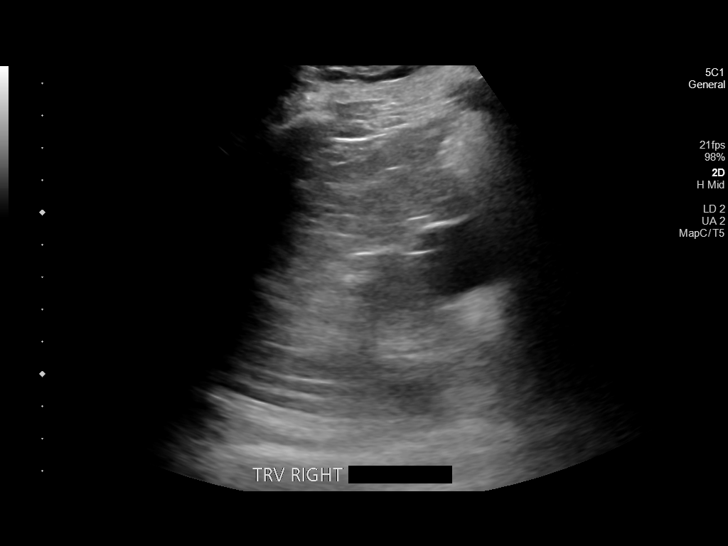
[im 22/66]
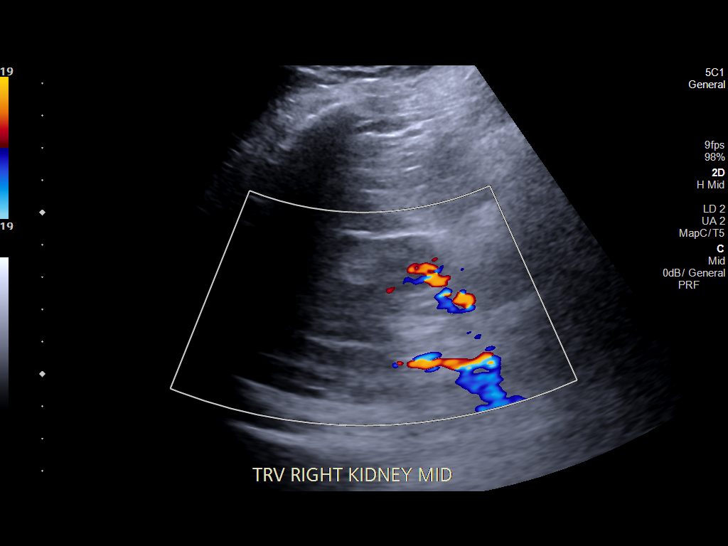
[im 25/66]
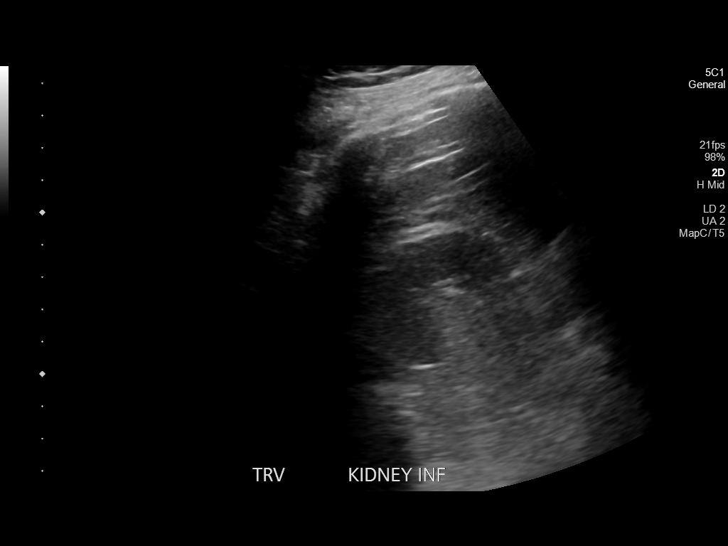
[im 30/66]
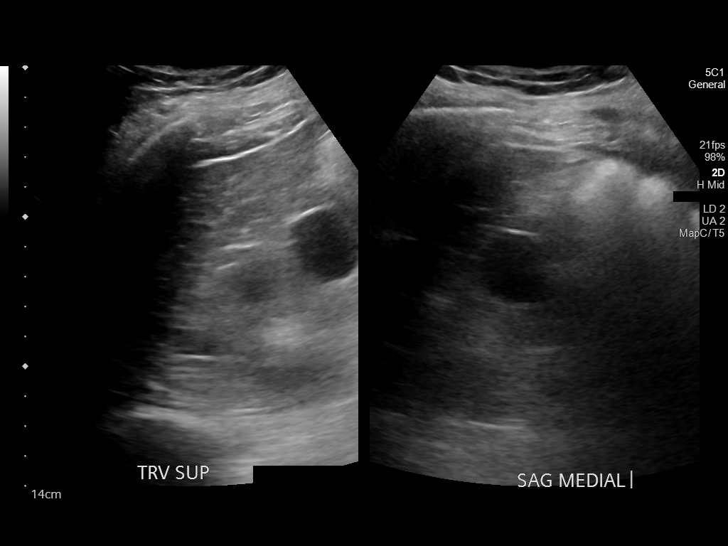
[im 36/66]
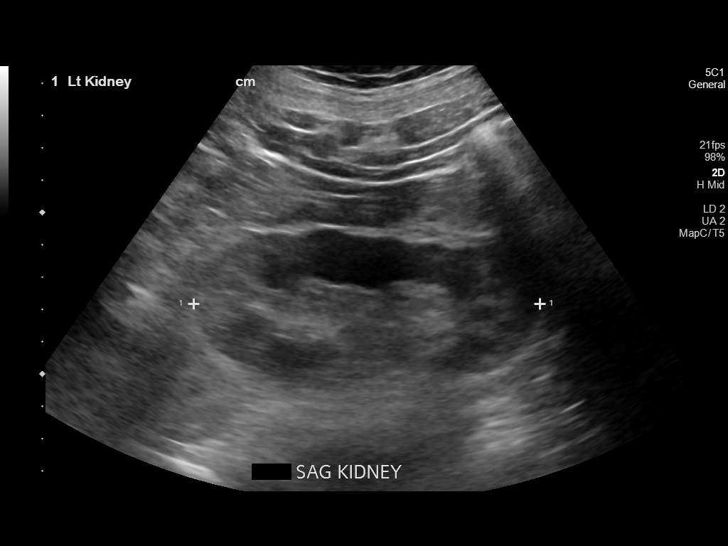
[im 41/66]
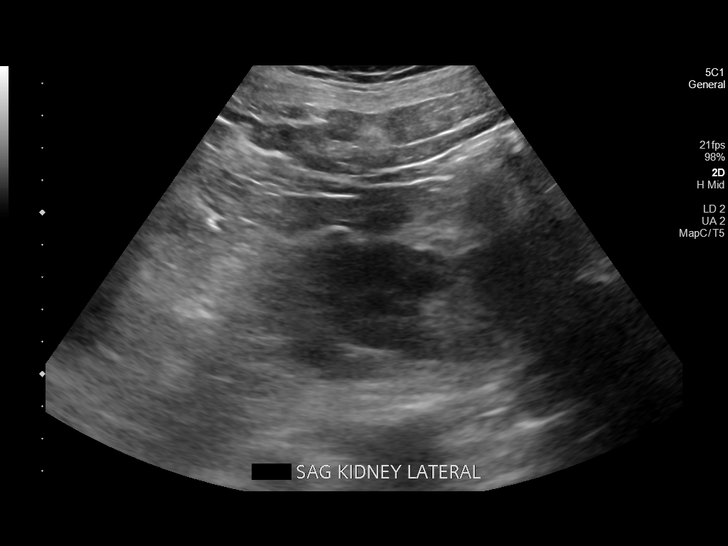
[im 44/66]
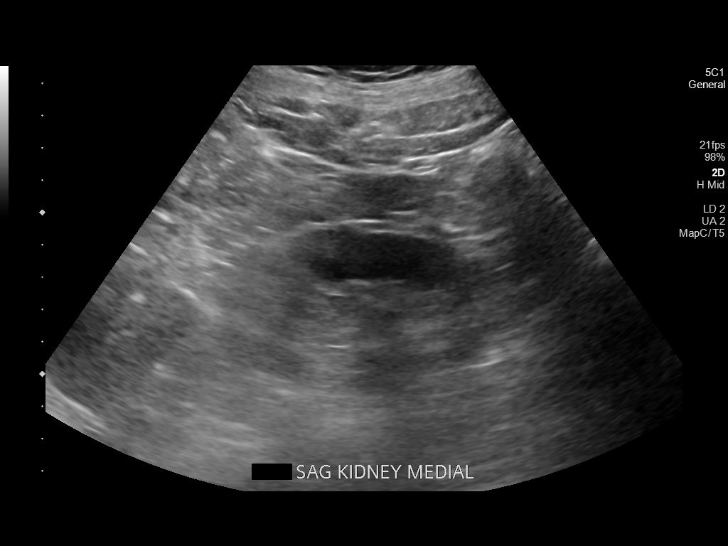
[im 49/66]
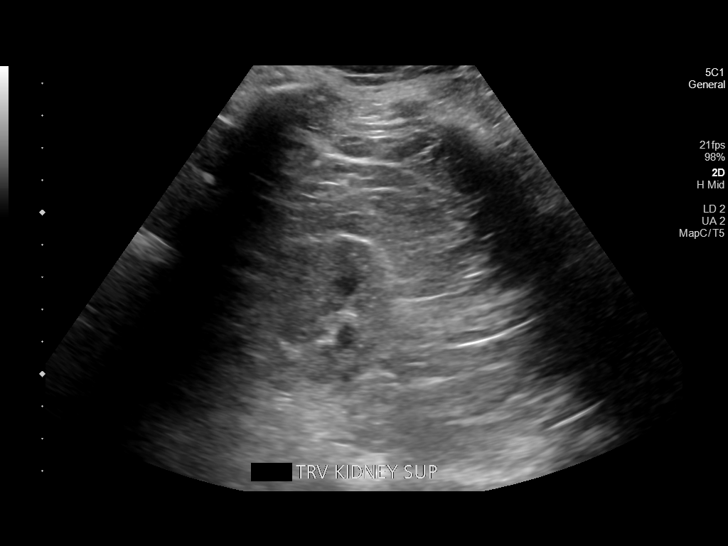
[im 55/66]
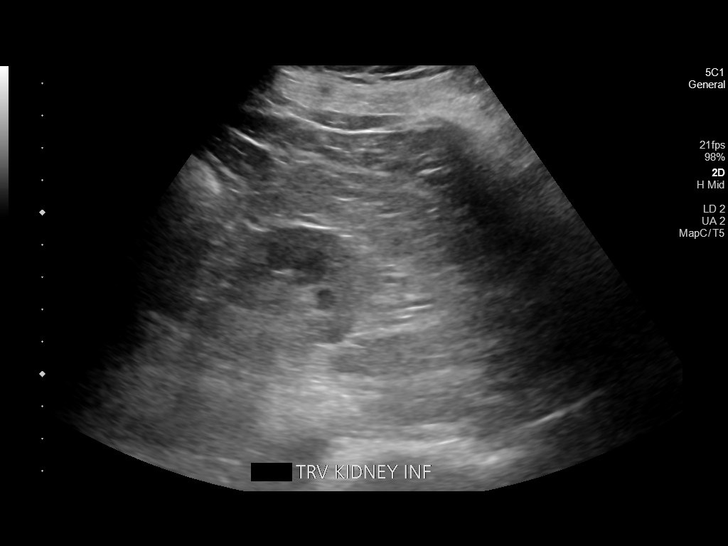
[im 60/66]
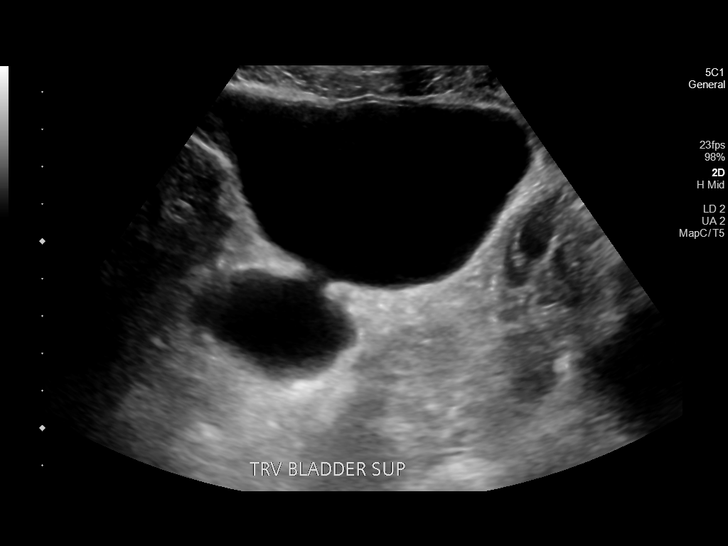
[im 66/66]
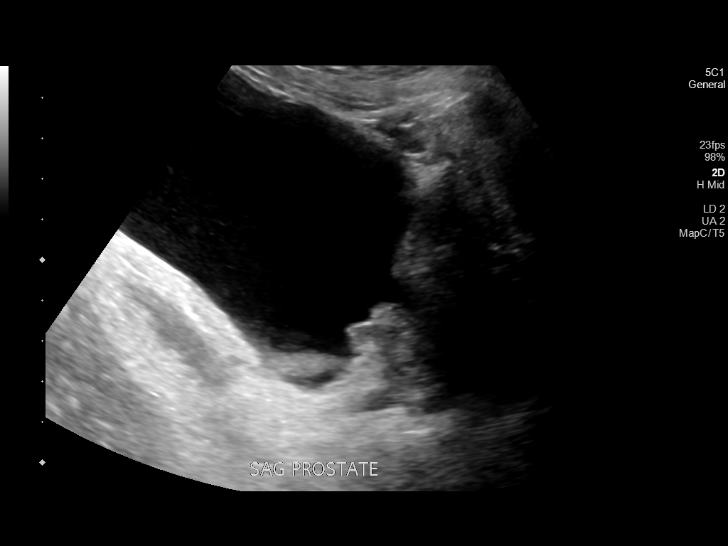

[14 of 25 positions shown; findings below may reference images not displayed]

FINDINGS: Right Kidney:

Renal measurements: 8.8 x 4.4 x 5.8 cm = volume: 117 mL.
Echogenicity within normal limits. 2.4 cm exophytic cyst emanating
from the interpolar region of the right kidney. No shadowing stone
or hydronephrosis visualized.

Left Kidney:

Renal measurements: 10.7 x 3.8 x 4.2 cm = volume: 89 mL.
Echogenicity within normal limits. No mass, shadowing stone, or
hydronephrosis visualized.

Bladder:

Moderate-sized narrow-necked diverticulum emanates from the
posterosuperior aspect of the bladder, as seen on recent CT. Small
amount of debris within the bladder dependently.

Other:

None.
IMPRESSION: 1. Urinary bladder diverticulum, as seen on recent CT.
2. A small amount of debris is seen within the urinary bladder,
which may be related to stasis or blood products.
3. No evidence of obstructive uropathy.

## 2022-12-06 IMAGING — DX DG CHEST 2V
2 series · 2 of 2 positions shown · non-contrast
Comparison: 05/15/2021; chest CT-05/15/2021

CLINICAL DATA: Chest pain.  Recent diagnosis of pulmonary embolism.

EXAM:
CHEST - 2 VIEW

[chest pa]
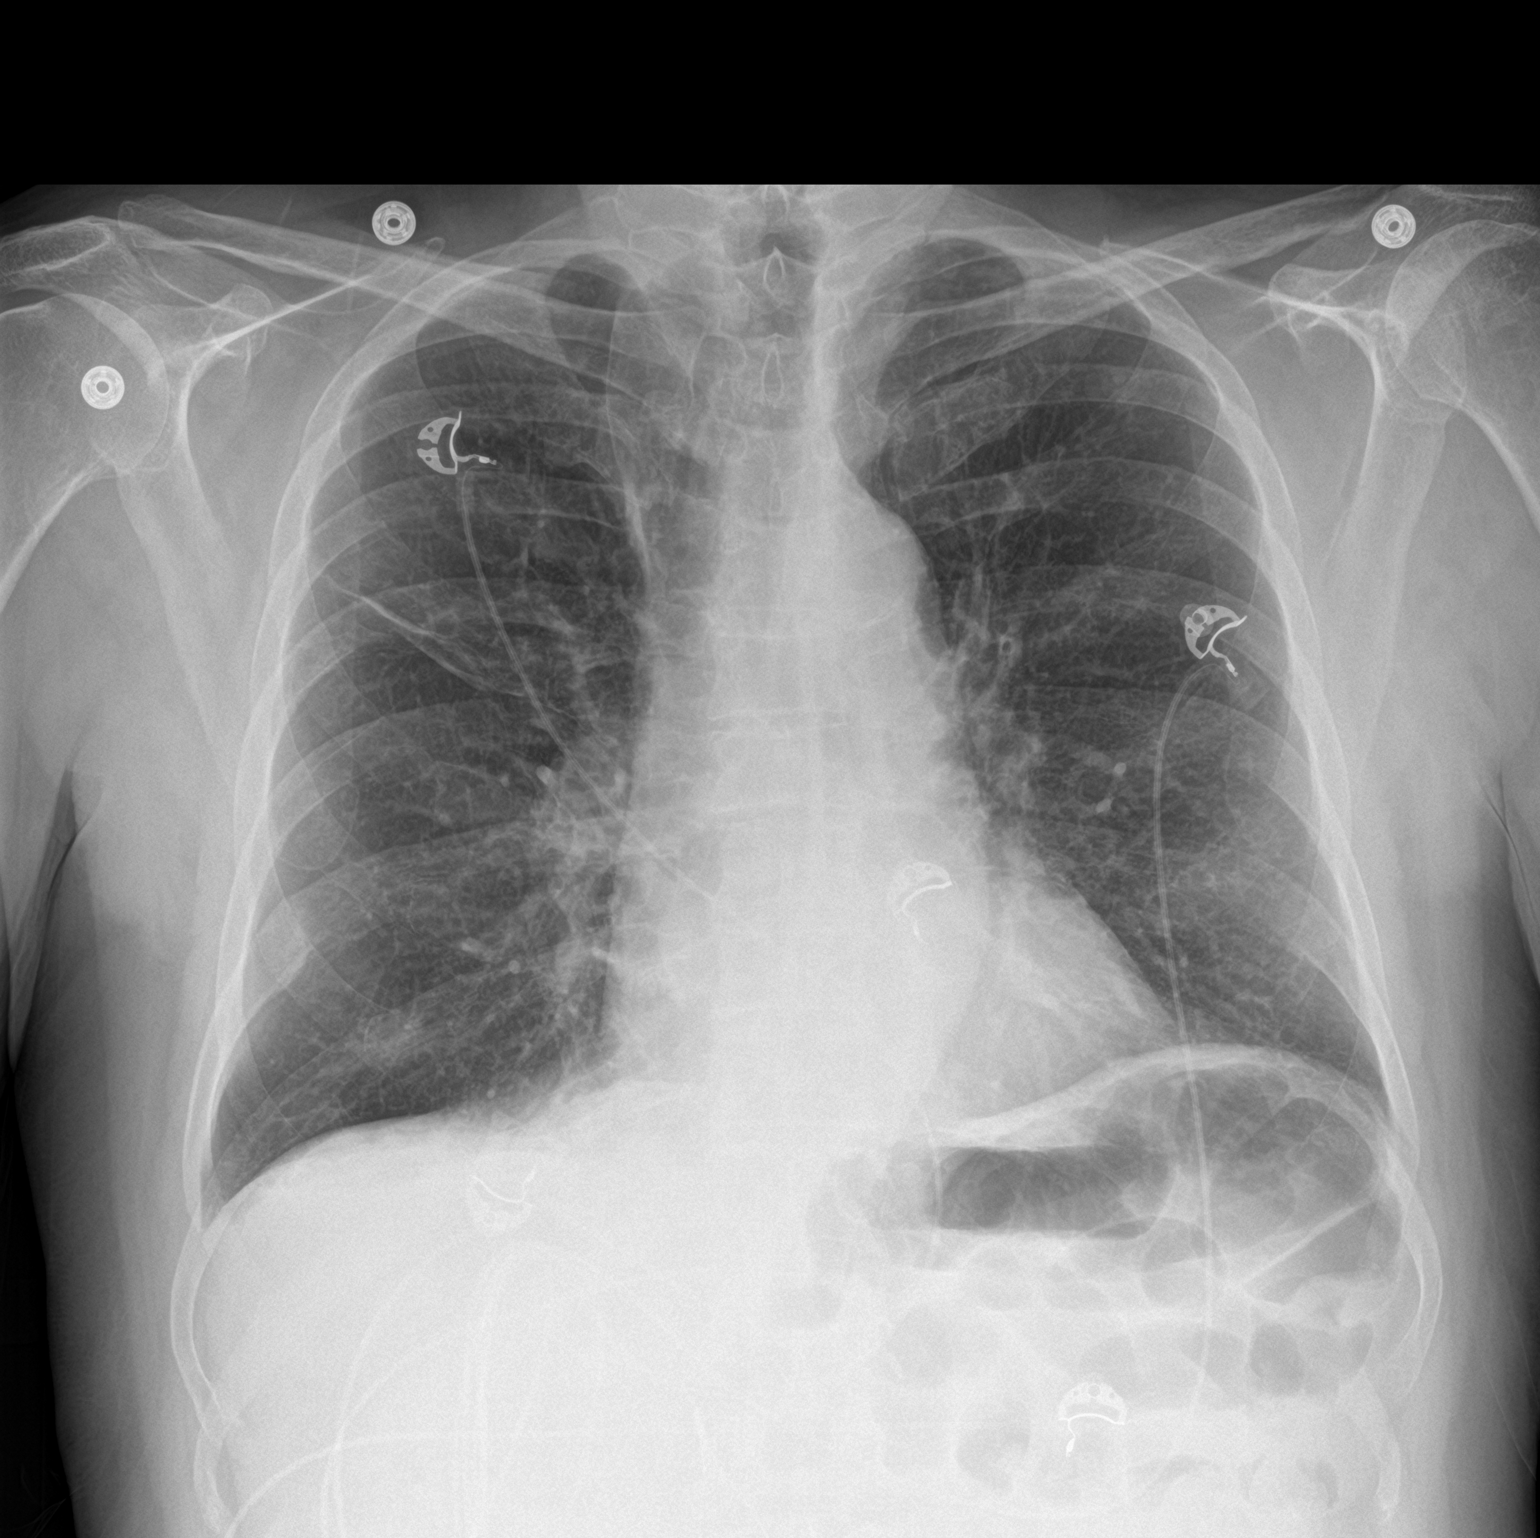

[chest lat]
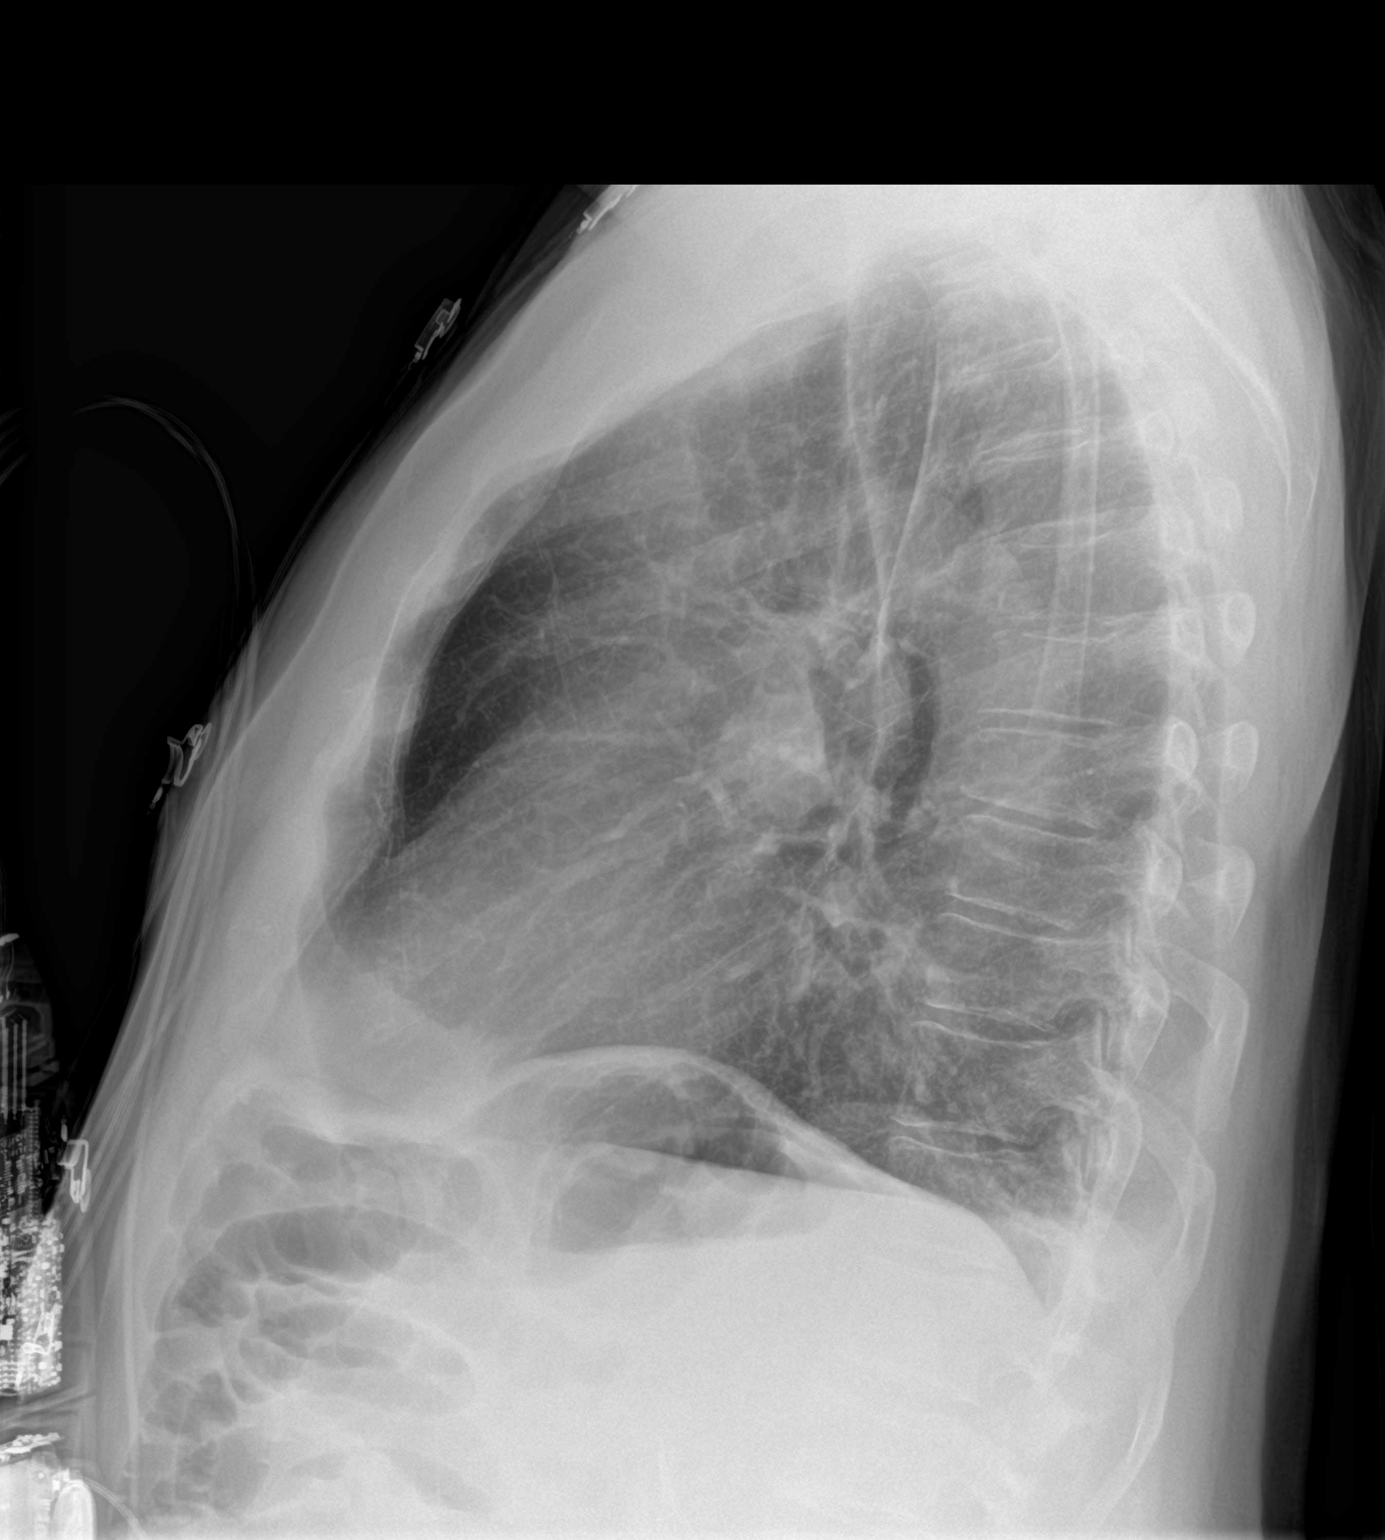

[2 of 2 positions shown; findings below may reference images not displayed]

FINDINGS: Grossly unchanged cardiac silhouette and mediastinal contours with
atherosclerotic plaque within the thoracic aorta. The lungs appear
hyperexpanded with flattening diaphragms and mild diffuse slightly
nodular thickening of the pulmonary interstitium. Right mid lung
linear heterogeneous opacities are unchanged. Minimal right basilar
heterogeneous opacities unchanged. No definite pleural effusion or
pneumothorax. No acute osseous abnormalities. Suspected old/healed
right eighth rib fracture.
IMPRESSION: Similar findings of lung hyperexpansion and right basilar
heterogeneous opacities, atelectasis versus infiltrate.

## 2022-12-18 ENCOUNTER — Other Ambulatory Visit: Payer: Self-pay

## 2022-12-18 NOTE — Progress Notes (Signed)
Specialty Pharmacy Refill Coordination Note  James Barker is a 72 y.o. male contacted today regarding refills of specialty medication(s) Dupilumab   Patient requested Daryll Drown at [facility] at [place] date: 12/27/22   Medication will be filled on 12/26/22.

## 2022-12-26 ENCOUNTER — Telehealth: Payer: Self-pay | Admitting: Internal Medicine

## 2022-12-26 ENCOUNTER — Other Ambulatory Visit: Payer: Self-pay

## 2022-12-26 NOTE — Telephone Encounter (Signed)
James Barker states Advair will not be covered by insurance in January 2025. Medications will be covered is [employer] & Symbicort. Prefers [employer]. James Barker phone number is [phone].

## 2022-12-30 ENCOUNTER — Other Ambulatory Visit (HOSPITAL_COMMUNITY): Payer: Self-pay

## 2023-01-03 MED ORDER — FLUTICASONE-SALMETEROL 250-50 MCG/ACT IN AEPB: 1.0000 | INHALATION_SPRAY | Freq: Two times a day (BID) | RESPIRATORY_TRACT | 5 refills | Status: DC

## 2023-01-03 NOTE — Telephone Encounter (Signed)
Wixela sent to pharmacy

## 2023-01-16 ENCOUNTER — Other Ambulatory Visit: Payer: Self-pay

## 2023-01-16 NOTE — Progress Notes (Signed)
Specialty Pharmacy Refill Coordination Note  James Barker is a 72 y.o. male contacted today regarding refills of specialty medication(s) Dupilumab (Dupixent)   Patient requested Delivery   Delivery date: 01/29/23   Verified address: New address: [address] [name]   Medication will be filled on 01/28/23.

## 2023-01-16 NOTE — Progress Notes (Signed)
Patient called to confirm next injection date and requested updated shipping date. Patient is due to inject 01.08.25 and updated delivery date is 01.03.25

## 2023-01-23 ENCOUNTER — Other Ambulatory Visit: Payer: Self-pay

## 2023-01-23 ENCOUNTER — Telehealth: Payer: Self-pay | Admitting: Internal Medicine

## 2023-01-23 ENCOUNTER — Other Ambulatory Visit: Payer: Self-pay | Admitting: Internal Medicine

## 2023-01-23 DIAGNOSIS — J441 Chronic obstructive pulmonary disease with (acute) exacerbation: Secondary | ICD-10-CM

## 2023-01-23 MED ORDER — PREDNISONE 10 MG PO TABS
ORAL_TABLET | ORAL | 0 refills | Status: DC
Start: 2023-01-23 — End: 2023-05-29

## 2023-01-23 MED ORDER — AZITHROMYCIN 250 MG PO TABS
ORAL_TABLET | ORAL | 0 refills | Status: DC
Start: 2023-01-23 — End: 2023-01-29

## 2023-01-23 NOTE — Telephone Encounter (Signed)
 Pt states he has asthma and copd. Pt states his cough has worsened the last 2 days and the cough is hurting his chest. The cough is wet and the mucous is yellow. No blood. Chest pain is a 7-8 on a pain scale. Pt states he will have off and on SOB but states it is not unusual for him. Not on O2. Pt states she thought he was having a asthma attack due to bad SOB, coughing, and unable to control it. Pt is still using Advair  and his albuterol . Albuterol  is helping. Pt states he was put on Wixela due to insurance purposes and is using what is left of his Advair  now. Pt is concerned with his HX of pneumonia and pulmonary embolisms. Pt wants to know what is recommended. Pt is seeing Dr Meade on 01-29-2023 for a f/u. Please advise.

## 2023-01-23 NOTE — Telephone Encounter (Signed)
 Patient states having symptom of cough. No available appointments at this time. Pharmacy is [facility] [address] Patient phone number is [phone].

## 2023-01-23 NOTE — Telephone Encounter (Signed)
 Is the chest discomfort only with coughing or constant? If constant, needs to go the ED immediately. If with coughing spells, will send in z pack and prednisone  taper for bronchitis. He should use neb treatments 3 times a day until symptoms improve. Use OTC mucinex  DM or robitussin DM. If he develops new fevers, go to UC or if symptoms worsen, go to ED. Keep f/u with Dr. Meade.

## 2023-01-23 NOTE — Telephone Encounter (Signed)
 Pt states the chest pain is only when he coughs. I told pt to take the OTC medications recommended and explained to him that Florentina Addison, NP would send in the prescribed RX's recommended. Pt verbalized understanding. Nothing further needed.

## 2023-01-28 ENCOUNTER — Other Ambulatory Visit: Payer: Self-pay

## 2023-01-28 ENCOUNTER — Ambulatory Visit: Payer: 59 | Admitting: Infectious Diseases

## 2023-01-28 VITALS — BP 130/78 | HR 54 | Temp 97.6°F | Wt 175.0 lb

## 2023-01-28 DIAGNOSIS — Z23 Encounter for immunization: Secondary | ICD-10-CM | POA: Diagnosis not present

## 2023-01-28 DIAGNOSIS — A319 Mycobacterial infection, unspecified: Secondary | ICD-10-CM

## 2023-01-28 DIAGNOSIS — R61 Generalized hyperhidrosis: Secondary | ICD-10-CM

## 2023-01-28 DIAGNOSIS — Z22349 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, unspecified: Secondary | ICD-10-CM | POA: Diagnosis not present

## 2023-01-28 DIAGNOSIS — J441 Chronic obstructive pulmonary disease with (acute) exacerbation: Secondary | ICD-10-CM

## 2023-01-28 NOTE — Progress Notes (Signed)
 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   Encounter for screening for HIV 09/09/2022   Chronic night sweats 09/09/2022   Acute bronchitis due to Rhinovirus 06/26/2022   COPD exacerbation (HCC) 06/24/2022   Bladder cancer (HCC) 06/06/2021   Insomnia 05/17/2021   CKD-3A 05/17/2021   Gross hematuria 05/16/2021   Bilateral pulmonary embolism (HCC) 05/15/2021   History of bladder cancer s/p TURBT and Chemo in [place] 05/15/2021   Presence of IVC filter 05/15/2021   BPH (benign prostatic hyperplasia) 05/15/2021   Chest pain 05/15/2021   Asthma with COPD with exacerbation (HCC) 04/13/2021   Eosinophilia 04/13/2021   Hypertension 04/13/2021   Allergic Asthma COPD Overlap Syndrome 01/02/2021   Current Outpatient Medications on File Prior to Visit  Medication Sig Dispense Refill   albuterol  (PROVENTIL ) (2.5 MG/3ML) 0.083% nebulizer solution Take 3 mLs (2.5 mg total) by nebulization every 6 (six) hours as needed for wheezing or shortness of breath. 225 mL 3   albuterol  (VENTOLIN  HFA) 108 (90 Base) MCG/ACT inhaler TAKE 2 PUFFS BY MOUTH EVERY 6 HOURS AS NEEDED FOR WHEEZE OR SHORTNESS OF BREATH 8.5 each 5   amLODipine  (NORVASC ) 10 MG tablet Take 1 tablet (10 mg total) by mouth daily. 30 tablet 1   atorvastatin  (LIPITOR) 20 MG tablet Take 20 mg by mouth daily.     azithromycin  (ZITHROMAX ) 250 MG tablet Take 2 tablets on day one then take 1 tablet daily for four additional days 6 tablet 0   budesonide  (PULMICORT ) 0.25 MG/2ML nebulizer solution Use 2 mLs (0.25 mg) by nebulization 2 times daily. 60 mL 12   Dupilumab  (DUPIXENT ) 300 MG/2ML SOAJ Inject 300 mg into the skin every 14 days. 12 mL 0   ELIQUIS  5 MG TABS tablet TAKE 1 TABLET BY MOUTH TWICE A DAY 60 tablet 2   fluticasone -salmeterol (WIXELA INHUB ) 250-50 MCG/ACT AEPB Inhale 1 puff into the lungs in the morning and at bedtime. 60 each 5   guaiFENesin  (MUCINEX ) 600 MG 12 hr  tablet Take 1 tablet (600 mg total) by mouth 2 (two) times daily. (Patient not taking: Reported on 10/15/2022) 60 tablet 1   HYDROcodone -acetaminophen  (NORCO/VICODIN) 5-325 MG tablet Take 1 tablet by mouth every 4 (four) hours as needed for moderate pain. (Patient not taking: Reported on 10/15/2022) 30 tablet 0   ondansetron  (ZOFRAN -ODT) 4 MG disintegrating tablet 4mg  ODT q4 hours prn nausea/vomit (Patient taking differently: Take 4 mg by mouth every 8 (eight) hours as needed for nausea or vomiting. 4mg  ODT q4 hours prn nausea/vomit) 10 tablet 0   predniSONE  (DELTASONE ) 10 MG tablet 4 tabs for 2 days, then 3 tabs for 2 days, 2 tabs for 2 days, then 1 tab for 2 days, then stop 20 tablet 0   sodium chloride  0.9 % nebulizer solution Take 3 mLs by nebulization 3 (three) times daily as needed for wheezing. 90 mL 12   Tiotropium Bromide  Monohydrate (SPIRIVA  RESPIMAT) 2.5 MCG/ACT AERS INHALE 2 PUFFS BY MOUTH INTO THE LUNGS DAILY (Patient taking differently: Inhale 2 each into the lungs daily.) 4 g 11   No current facility-administered medications on file prior to visit.   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   Reports  having pulmonary MAC 4 years ago when he was in [place] .  He was seen by infectious disease at that time and was decided not to be treated.  He however reports having night sweats on and off for few months, not every single day but once or twice in a week with ? Subjective chills.  Denies having any fevers.  He reports having shortness of breath and cough for years due to asthma/COPD.  Cough is chronic and mostly dry but occasionally with thick sputum.  He also has chronic shortness of breath but is able to go to gym and walk for 1 hour in a treadmill usually.  He is  able to do ADLs in his house like cooking, cleaning, laundry and grocery.  He lives with his ex-wife and 2 adult children.  He reports having diarrhea due to IBS but denies nausea vomiting or abdominal pain.  He is appetite is okay and thinks he is gaining weight.  Quit smoking 30 years ago smokes marijuana edible in a while denies any alcohol and IVDU.  He was treated for latent tuberculosis as well as hepatitis C in the past and was told to be cured.  He was admitted in June 2024 for exacerbation of asthma/copd with rhino virus infection. Completed course of PO azithromycin . He was seen by Dr Efrain in 12/2020 for Pulmonary MAI infection as well as acinetobacter with no indication to treat.   1/7 Was seen by Dr Dennise 9/24. AFB sputum smears and cx 8/19 and 8/20 negative. Reports he recently had cough and cold with possible bronchitis and is recovering. He is finishing up his course of Zpack and prednisone . Feels cough and breathing is almost same but recently declined due to bronchitis. He has not gone to gym for last 1 month due to moving houses, otherwise no changes in ADLs. Reports night sweats has resolved for last 1.5 months. No new complaints.   Review of Systems: all systems reviewed  and negative as above  Past Medical History:  Diagnosis Date   Anemia    Arthritis    Asthma-COPD overlap syndrome    pulmonology--- dr n desai;   last exacerbation 05-08-2021   Benign localized prostatic hyperplasia with lower urinary tract symptoms (LUTS)    Chronic cough    due to copd   Chronic kidney disease    Dairy product intolerance    DOE (dyspnea on exertion)    d/t asthma/ COPD   Dyspnea    Emphysema, unspecified (HCC)    Full dentures    History of bladder cancer 2020   s/p TURBT w/ chemo instillation   History of COVID-19    per pt 2021 (no at same time of pneumonia)  w/ moderate symptoms , recovered at home, that resolved back to baseline   History of hepatitis C 2018   hx chronic  hepatitis C,  per pt treated and told was cured   History of latent tuberculosis 2017   pt tested positive for TB exposure, not active, treated w/ INH for 9 months   History of MAI infection 2021   never treated   History of pulmonary embolism    (05-09-2021  pt stated never had clot prior to 2017 , none since 2020, had negative blood disorder work-up)//  2017  treated w/ xarelto 6 months//  and recurrent 04/ 2020 in setting severe UTI and deconditioning w/ prolonged admission due to intolerant to anticoagulants IVC filter inserted   Hypertension  OSA (obstructive sleep apnea)    per pt dx yrs ago, intolerant to cpap   Pneumonia    Pulmonary eosinophilia (HCC)    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   Weak urinary stream    Wears glasses    Past Surgical History:  Procedure Laterality Date   ANAL FISTULECTOMY     x5  last one 2008   BRONCHOSCOPY  2021   per pt had pneumonia   (done in [place] )   INGUINAL HERNIA REPAIR Right 2010   IVC FILTER INSERTION  2020   in [place]   ORIF WRIST FRACTURE Left 2016   SHOULDER ARTHROSCOPY W/ ROTATOR CUFF REPAIR Left 2016   SHOULDER ARTHROSCOPY WITH ROTATOR CUFF REPAIR AND SUBACROMIAL DECOMPRESSION Left 05/09/2022   Procedure: SHOULDER ARTHROSCOPY WITH ROTATOR CUFF REPAIR AND SUBACROMIAL DECOMPRESSION;  Surgeon: Dozier Soulier, MD;  Location: [facility];  Service: Orthopedics;  Laterality: Left;   TRANSURETHRAL RESECTION OF BLADDER TUMOR  2020   in [place]   TRANSURETHRAL RESECTION OF PROSTATE  2021   in [place]   TRANSURETHRAL RESECTION OF PROSTATE N/A 05/11/2021   Procedure: PROSTATE EXAM UNDER ANESTHESIA; TRANSURETHRAL RESECTION OF THE PROSTATE (TURP), BLADDER BIOPSY;  Surgeon: Nieves Cough, MD;  Location[facility];  Service: Urology;  Laterality: N/A;   UMBILICAL HERNIA REPAIR  2011    Social History   Tobacco Use   Smoking status: Former    Current packs/day: 0.00    Average packs/day: 1 pack/day for 20.0 years (20.0 ttl pk-yrs)     Types: Cigarettes    Start date: 60    Quit date: 28    Years since quitting: 33.0   Smokeless tobacco: Never  Vaping Use   Vaping status: Never Used  Substance Use Topics   Alcohol use: Not Currently   Drug use: Yes    Types: Marijuana    Comment: 05-09-2021  per pt smokes occasional    Family History  Problem Relation Age of Onset   Pulmonary embolism Mother    Emphysema Father    Lung cancer Sister    Asthma Child    Asthma Child     Allergies  Allergen Reactions   Cheese Nausea And Vomiting   Lactose Intolerance (Gi) Diarrhea and Nausea And Vomiting    Health Maintenance  Topic Date Due   Pneumonia Vaccine 38+ Years old (1 of 2 - PCV) 03/20/1956   Hepatitis C Screening  Never done   DTaP/Tdap/Td (1 - Tdap) Never done   Zoster Vaccines- Shingrix (1 of 2) 03/20/1969   Colonoscopy  Never done   INFLUENZA VACCINE  08/22/2022   COVID-19 Vaccine (4 - 2024-25 season) 09/22/2022   Medicare Annual Wellness (AWV)  12/23/2023   HPV VACCINES  Aged Out    Objective: BP 130/78   Pulse (!) 54   Temp 97.6 F (36.4 C) (Oral)   Wt 175 lb (79.4 kg)   SpO2 96%   BMI 27.41 kg/m    Physical Exam Constitutional:      Appearance: elderly male sitting in the chair  HENT:     Head: Normocephalic and atraumatic.      Mouth: Mucous membranes are moist.  Eyes:    Conjunctiva/sclera: Conjunctivae normal.     Pupils: Pupils are equal, round, and bilaterally symmetrical   Cardiovascular:     Rate and Rhythm: Normal rate and regular rhythm.     Heart sounds: s1s2  Pulmonary:     Effort: Pulmonary  effort is normal on RA    Breath sounds: decreased bilateral air entry and coasre b/l rhonchi  Abdominal:     General: Non distended     Palpations:   Musculoskeletal:        General: Normal range of motion.   Skin:    General: Skin is warm and dry.     Comments:  Neurological:     General: grossly non focal     Mental Status: awake, alert and oriented to person,  place, and time.   Psychiatric:        Mood and Affect: Mood normal.   Lab Results Lab Results  Component Value Date   WBC 14.7 (H) 06/26/2022   HGB 12.7 (L) 06/26/2022   HCT 38.8 (L) 06/26/2022   MCV 88.4 06/26/2022   PLT 238 06/26/2022    Lab Results  Component Value Date   CREATININE 1.21 06/26/2022   BUN 32 (H) 06/26/2022   NA 136 06/26/2022   K 4.9 06/26/2022   CL 105 06/26/2022   CO2 22 06/26/2022    Lab Results  Component Value Date   ALT 15 06/26/2022   AST 22 06/26/2022   ALKPHOS 55 06/26/2022   BILITOT 0.5 06/26/2022    No results found for: CHOL, HDL, LDLCALC, LDLDIRECT, TRIG, CHOLHDL No results found for: LABRPR, RPRTITER No results found for: HIV1RNAQUANT, HIV1RNAVL, CD4TABS   Microbiology Results for orders placed or performed in visit on 09/10/22   MYCOBACTERIA, CULTURE, WITH FLUOROCHROME SMEAR     Status: None   Collection Time: 09/10/22 10:22 AM   Specimen: First morning sputum (NOT saliva) (three separate specimens from three separate days are recommended)  Result Value Ref Range Status   MICRO NUMBER: [id number]  Final   SPECIMEN QUALITY: Adequate  Final   Source: SPUTUM  Final   STATUS: FINAL  Final   SMEAR:   Final    No acid-fast bacilli seen using the fluorochrome method.   RESULT:   Final    No Mycobacterium species isolated after 6 weeks incubation.   Imaging CT angio chest 06/24/22 IMPRESSION: 1. Negative for pulmonary embolism. 2. Mucous plugging and/or bronchial wall thickening in the right lower lobe. 3. Centrilobular emphysema with areas of atelectasis in the lower lobes. 4. 3 mm nodule in the anterior right lung is likely benign based on the stability. 5.  Emphysema (ICD10-J43.9).  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  # Pulmonary NTM infection vs colonization ( Mycobacterium [place]otenese ) - 09/09/22 sputum AFB smear and culture*2 negative  - 09/10/22 sputum AFB smear and culture negative   - Symptomatically no significant changes - no indication to treat  - Fu in 5 months   # Asthma/COPD # Tracheobronchomalacia - Fu with Pulmonary  # Possible acute bronchitis - complete course of z-pack and prednisone   # Immunization counseling  - willing to get Flu vaccine today   # Chronic night sweats - resolved - 8/15 blood cx 2/2 sets NGTD - 10/24/22 CT abdomen pelvis no significant abnormality   I have personally spent 40 minutes involved in face-to-face and non-face-to-face activities for this patient on the day of the visit. Professional time spent includes the following activities: Preparing to see the patient (review of tests), Obtaining and/or reviewing separately obtained history (admission/discharge record), Performing a medically appropriate examination and/or evaluation , Ordering medications/tests/procedures, referring and communicating with other health care professionals, Documenting clinical information in the EMR, Independently interpreting results (not separately reported), Communicating results  to the patient/family/caregiver, Counseling and educating the patient/family/caregiver and Care coordination (not separately reported).   Annalee Joseph, MD [facility] 01/28/2023, 10:30 AM

## 2023-01-29 ENCOUNTER — Ambulatory Visit: Payer: 59 | Admitting: Internal Medicine

## 2023-01-29 ENCOUNTER — Encounter: Payer: Self-pay | Admitting: Internal Medicine

## 2023-01-29 VITALS — BP 122/81 | HR 57 | Temp 97.7°F | Ht 67.0 in | Wt 177.2 lb

## 2023-01-29 DIAGNOSIS — J209 Acute bronchitis, unspecified: Secondary | ICD-10-CM | POA: Diagnosis not present

## 2023-01-29 DIAGNOSIS — J4489 Other specified chronic obstructive pulmonary disease: Secondary | ICD-10-CM | POA: Diagnosis not present

## 2023-01-29 DIAGNOSIS — J440 Chronic obstructive pulmonary disease with acute lower respiratory infection: Secondary | ICD-10-CM

## 2023-01-29 MED ORDER — SPIRIVA RESPIMAT 2.5 MCG/ACT IN AERS: 2.0000 | INHALATION_SPRAY | Freq: Every day | RESPIRATORY_TRACT | 11 refills | Status: DC

## 2023-01-29 MED ORDER — SODIUM CHLORIDE 0.9 % IN NEBU: 3.0000 mL | INHALATION_SOLUTION | Freq: Three times a day (TID) | RESPIRATORY_TRACT | 12 refills | Status: DC | PRN

## 2023-01-29 NOTE — Progress Notes (Signed)
 James Barker    [id number]    [birth year]  Primary Care Physician:[name], Therisa MATSU, PA Date of Appointment: 01/29/2023 Established Patient Visit  Chief complaint:   Chief Complaint  Patient presents with   Follow-up    F/u for Asthma      HPI: James Barker is a 73 y.o. man with history of severe allergic asthma COPD overlap syndrome. Dupixent  started 04/26/2021. 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 Tobacco use disorder history, quit in 1992.  Interval Updates: Here for 6 month follow up.  Not doing well - currently on abx and prednisone . Feeling better once this has started. Having trouble bringing up mucus. Taking albuterol  nebs 2-3 times/day. On his maintenance inhalers spiriva  and advair  - not taking budesonide  nebs.   Ringing up bright yellow phlegm. Using flutter valve but not saline.   Current Regimen: spiriva , advair , mucinex ,  prn albuterol  usually takes 3-4 times/day. dupixent .  Asthma Triggers: allergies, viral infections.  Exacerbations in the last year: 2 History of hospitalization or intubation: yes in [place]  and [place]  Allergy Testing: sensitization to aspergillus in 2023 on immunocap testing GERD: denies Allergic Rhinitis: occasional ACT:  Asthma Control Test ACT Total Score  01/29/2023 11:30 AM 12  03/13/2022  1:35 PM 12  03/05/2022  8:31 AM 11   FeNO: 53 ppb   I have reviewed the patient's family social and past medical history and updated as appropriate.   Past Medical History:  Diagnosis Date   Anemia    Arthritis    Asthma-COPD overlap syndrome [facility])    pulmonology--- dr n Kirstin Kugler;   last exacerbation 05-08-2021   Benign localized prostatic hyperplasia with lower urinary tract symptoms (LUTS)    Chronic cough    due to copd   Chronic kidney disease    Dairy product intolerance    DOE (dyspnea on exertion)    d/t asthma/ COPD   Dyspnea    Emphysema, unspecified (HCC)    Full  dentures    History of bladder cancer 2020   s/p TURBT w/ chemo instillation   History of COVID-19    per pt 2021 (no at same time of pneumonia)  w/ moderate symptoms , recovered at home, that resolved back to baseline   History of hepatitis C 2018   hx chronic hepatitis C,  per pt treated and told was cured   History of latent tuberculosis 2017   pt tested positive for TB exposure, not active, treated w/ INH for 9 months   History of MAI infection 2021   never treated   History of pulmonary embolism    (05-09-2021  pt stated never had clot prior to 2017 , none since 2020, had negative blood disorder work-up)//  2017  treated w/ xarelto 6 months//  and recurrent 04/ 2020 in setting severe UTI and deconditioning w/ prolonged admission due to intolerant to anticoagulants IVC filter inserted   Hypertension    OSA (obstructive sleep apnea)    per pt dx yrs ago, intolerant to cpap   Pneumonia    Pulmonary eosinophilia (HCC)    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   Weak urinary stream    Wears glasses     Past Surgical History:  Procedure Laterality Date   ANAL FISTULECTOMY     x5  last one 2008   BRONCHOSCOPY  2021   per pt had pneumonia   (  done in [place] )   INGUINAL HERNIA REPAIR Right 2010   IVC FILTER INSERTION  2020   in [place]   ORIF WRIST FRACTURE Left 2016   SHOULDER ARTHROSCOPY W/ ROTATOR CUFF REPAIR Left 2016   SHOULDER ARTHROSCOPY WITH ROTATOR CUFF REPAIR AND SUBACROMIAL DECOMPRESSION Left 05/09/2022   Procedure: SHOULDER ARTHROSCOPY WITH ROTATOR CUFF REPAIR AND SUBACROMIAL DECOMPRESSION;  Surgeon: Dozier Soulier, MD;  Location: [facility];  Service: Orthopedics;  Laterality: Left;   TRANSURETHRAL RESECTION OF BLADDER TUMOR  2020   in [place]   TRANSURETHRAL RESECTION OF PROSTATE  2021   in [place]   TRANSURETHRAL RESECTION OF PROSTATE N/A 05/11/2021   Procedure: PROSTATE EXAM UNDER ANESTHESIA; TRANSURETHRAL RESECTION OF THE PROSTATE (TURP), BLADDER BIOPSY;  Surgeon: Nieves Cough, MD;   Location: [facility];  Service: Urology;  Laterality: N/A;   UMBILICAL HERNIA REPAIR  2011    Family History  Problem Relation Age of Onset   Pulmonary embolism Mother    Emphysema Father    Lung cancer Sister    Asthma Child    Asthma Child     Social History   Occupational History   Not on file  Tobacco Use   Smoking status: Former    Current packs/day: 0.00    Average packs/day: 1 pack/day for 20.0 years (20.0 ttl pk-yrs)    Types: Cigarettes    Start date: 54    Quit date: 65    Years since quitting: 33.0   Smokeless tobacco: Never  Vaping Use   Vaping status: Never Used  Substance and Sexual Activity   Alcohol use: Not Currently   Drug use: Yes    Types: Marijuana    Comment: 05-09-2021  per pt smokes occasional   Sexual activity: Not on file     Physical Exam: Blood pressure 122/81, pulse (!) 57, temperature 97.7 F (36.5 C), temperature source Oral, height 5' 7 (1.702 m), weight 177 lb 3.2 oz (80.4 kg), SpO2 99%.  Gen: no distress CV: RRR no mrg Resp: ctab no wheezing or crackles  Data Reviewed: Imaging: I have personally reviewed the CTPE study June 2024 which is negative for PE, no lobar pneumonia, mild bibasilar atelectasis and 3mm right anterior lung nodule. [place]ath trachea  PFTs:     Latest Ref Rng & Units 02/20/2021    2:58 PM  PFT Results  FVC-Pre L 3.20   FVC-Predicted Pre % 84   FVC-Post L 3.30   FVC-Predicted Post % 87   Pre FEV1/FVC % % 49   Post FEV1/FCV % % 51   FEV1-Pre L 1.57   FEV1-Predicted Pre % 56   FEV1-Post L 1.70   DLCO uncorrected ml/min/mmHg 14.74   DLCO UNC% % 64   DLCO corrected ml/min/mmHg 14.74   DLCO COR %Predicted % 64   DLVA Predicted % 67   TLC L 6.54   TLC % Predicted % 105   RV % Predicted % 131    I have personally reviewed the patient's PFTs and moderately severe airflow limitation. Reduced diffusion capacity  Labs: Rhinovirus positive June 2024 virus panel  Immunization  status: Immunization History  Administered Date(s) Administered   Fluad Trivalent(High Dose 65+) 01/28/2023   Influenza, High Dose Seasonal PF 12/08/2020   Moderna Sars-Covid-2 Vaccination 03/25/2019, 04/22/2019, 09/30/2019   Pneumococcal-Unspecified 12/08/2020    External Records Personally Reviewed: ID  Assessment:  Allergic Asthma COPD Overlap Syndrome on dupixent .  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 Please start taking albuterol  nebs three times daily, followed by nebulized saline and your flutter valve. This is to help bring up mucus. Please drop off a sputum sample for me when you can.   Continue advair , spiriva , and dupixent . Continue albuterol  inhaler as needed. Continue eliquis .   Overall his symptoms are much improved since starting dupixent . CT Chest shows some tracheomalacia exacerbated by Inhaled steroids and tobacco use (former.)  Would limit ICS as able and use pulmicort  nebs during flare only. May need to evaluate for PAP therapy in future. Needs sputum cultures when he has a flare to evaluate for resistant organisms given his h/o colonization.   I spent 30 minutes in the care of this patient today including pre-charting, chart review, review of results, face-to-face care, coordination of care and communication with consultants etc.).  Return to Care: Return in about 6 months (around 07/29/2023).   Verdon Gore, MD Pulmonary and Critical Care Medicine [facility] Office:[phone]

## 2023-01-29 NOTE — Patient Instructions (Addendum)
 It was a pleasure to see you today!  Please schedule follow up scheduled with myself in 6 months.  If my schedule is not open yet, we will contact you with a reminder closer to that time. Please call [phone] if you haven't heard from us  a month before, and always call us  sooner if issues or concerns arise. You can also send us  a message through MyChart, but but aware that this is not to be used for urgent issues and it may take up to 5-7 days to receive a reply. Please be aware that you will likely be able to view your results before I have a chance to respond to them. Please give us  5 business days to respond to any non-urgent results.    Sorry to hear you aren't feeling well. Please start taking albuterol  nebs three times daily, followed by nebulized saline and your flutter valve. This is to help bring up mucus. Please drop off a sputum sample for me when you can.   Continue advair , spiriva , and dupixent . Continue albuterol  inhaler as needed. Continue eliquis .

## 2023-01-31 DIAGNOSIS — Z22349 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, unspecified: Secondary | ICD-10-CM | POA: Insufficient documentation

## 2023-01-31 DIAGNOSIS — Z23 Encounter for immunization: Secondary | ICD-10-CM | POA: Insufficient documentation

## 2023-02-10 ENCOUNTER — Other Ambulatory Visit: Payer: 59

## 2023-02-10 DIAGNOSIS — J440 Chronic obstructive pulmonary disease with acute lower respiratory infection: Secondary | ICD-10-CM | POA: Diagnosis not present

## 2023-02-10 DIAGNOSIS — J209 Acute bronchitis, unspecified: Secondary | ICD-10-CM | POA: Diagnosis not present

## 2023-02-17 ENCOUNTER — Other Ambulatory Visit: Payer: Self-pay | Admitting: Internal Medicine

## 2023-02-17 DIAGNOSIS — J4489 Other specified chronic obstructive pulmonary disease: Secondary | ICD-10-CM

## 2023-02-17 DIAGNOSIS — D721 Eosinophilia, unspecified: Secondary | ICD-10-CM

## 2023-02-18 ENCOUNTER — Other Ambulatory Visit: Payer: Self-pay

## 2023-02-18 MED ORDER — DUPIXENT 300 MG/2ML ~~LOC~~ SOAJ
300.0000 mg | SUBCUTANEOUS | 1 refills | Status: DC
  Filled 2023-02-18: qty 12, 84d supply, fill #0
  Filled 2023-02-20: qty 4, 28d supply, fill #0
  Filled 2023-03-14: qty 4, 28d supply, fill #1
  Filled 2023-04-10: qty 4, 28d supply, fill #2
  Filled 2023-05-13: qty 4, 28d supply, fill #3
  Filled 2023-06-09: qty 4, 28d supply, fill #4
  Filled 2023-07-11: qty 4, 28d supply, fill #5

## 2023-02-18 NOTE — Telephone Encounter (Signed)
Refill sent for DUPIXENT to [facility]: [phone]   Dose: 300mg  SQ every 14 days  Last OV: 01/29/2023 Provider: Dr. Celine Mans  Next OV: not yet scheduled (due in July 2025)  Chesley Mires, PharmD, MPH, BCPS Clinical Pharmacist (Rheumatology and Pulmonology)

## 2023-02-20 ENCOUNTER — Other Ambulatory Visit: Payer: Self-pay | Admitting: Pharmacy Technician

## 2023-02-20 ENCOUNTER — Other Ambulatory Visit: Payer: Self-pay

## 2023-02-20 ENCOUNTER — Other Ambulatory Visit (HOSPITAL_COMMUNITY): Payer: Self-pay

## 2023-02-20 NOTE — Progress Notes (Signed)
Specialty Pharmacy Refill Coordination Note  James Barker is a 73 y.o. male contacted today regarding refills of specialty medication(s) Dupilumab (Dupixent)   Patient requested Delivery   Delivery date: 02/21/23   Verified address: [address] [place]   Medication will be filled on 02/20/23.

## 2023-02-27 DIAGNOSIS — N323 Diverticulum of bladder: Secondary | ICD-10-CM | POA: Diagnosis not present

## 2023-02-27 DIAGNOSIS — Z8551 Personal history of malignant neoplasm of bladder: Secondary | ICD-10-CM | POA: Diagnosis not present

## 2023-02-27 DIAGNOSIS — N281 Cyst of kidney, acquired: Secondary | ICD-10-CM | POA: Diagnosis not present

## 2023-03-10 DIAGNOSIS — Z0000 Encounter for general adult medical examination without abnormal findings: Secondary | ICD-10-CM | POA: Diagnosis not present

## 2023-03-10 DIAGNOSIS — Z86711 Personal history of pulmonary embolism: Secondary | ICD-10-CM | POA: Diagnosis not present

## 2023-03-10 DIAGNOSIS — J449 Chronic obstructive pulmonary disease, unspecified: Secondary | ICD-10-CM | POA: Diagnosis not present

## 2023-03-10 DIAGNOSIS — E7800 Pure hypercholesterolemia, unspecified: Secondary | ICD-10-CM | POA: Diagnosis not present

## 2023-03-10 DIAGNOSIS — J418 Mixed simple and mucopurulent chronic bronchitis: Secondary | ICD-10-CM | POA: Diagnosis not present

## 2023-03-10 DIAGNOSIS — I10 Essential (primary) hypertension: Secondary | ICD-10-CM | POA: Diagnosis not present

## 2023-03-10 DIAGNOSIS — Z8551 Personal history of malignant neoplasm of bladder: Secondary | ICD-10-CM | POA: Diagnosis not present

## 2023-03-10 DIAGNOSIS — N1831 Chronic kidney disease, stage 3a: Secondary | ICD-10-CM | POA: Diagnosis not present

## 2023-03-14 ENCOUNTER — Other Ambulatory Visit: Payer: Self-pay

## 2023-03-14 NOTE — Progress Notes (Signed)
 Specialty Pharmacy Refill Coordination Note  James Barker is a 73 y.o. male contacted today regarding refills of specialty medication(s) Dupilumab (Dupixent)   Patient requested Delivery   Delivery date: 03/21/23   Verified address: [address] [place]   Medication will be filled on 02.27.25.

## 2023-03-20 ENCOUNTER — Other Ambulatory Visit: Payer: Self-pay

## 2023-03-27 LAB — AFB CULTURE WITH SMEAR (NOT AT ARMC)
Acid Fast Culture: NEGATIVE
Acid Fast Smear: NEGATIVE

## 2023-04-01 DIAGNOSIS — M1811 Unilateral primary osteoarthritis of first carpometacarpal joint, right hand: Secondary | ICD-10-CM | POA: Diagnosis not present

## 2023-04-01 DIAGNOSIS — M19031 Primary osteoarthritis, right wrist: Secondary | ICD-10-CM | POA: Diagnosis not present

## 2023-04-03 ENCOUNTER — Telehealth: Payer: Self-pay | Admitting: Internal Medicine

## 2023-04-03 NOTE — Telephone Encounter (Signed)
 Fax received from Dr. Mack Hook with [facility] to perform a right thumb suspension arthroplasty on patient.  Patient needs surgery clearance. Surgery is 04/23/23. Patient was seen on 01/29/23. Office protocol is a risk assessment can be sent to surgeon if patient has been seen in 60 days or less.   Sending to Dr Celine Mans for risk assessment or recommendations if patient needs to be seen in office prior to surgical procedure.    Please advise if you can clear or if pt needs to reschedule surgery. No openings here sooner than surgery on 04/23/23

## 2023-04-07 NOTE — Telephone Encounter (Signed)
 This is a preoperative pulmonary evaluation for James Barker for right thumb arthroplasty.  ASSESSMENT: James Barker has a low  risk of post-operative pulmonary complications by ARISCAT Index.  The absolute assessment of risk/benefit of the procedure is deferred to the primary team's evaluation.  RECOMMENDATIONS:  In order to minimize the risk of complications and optimize pulmonary status, we recommend the following: - Encourage aggressive incentive spirometry hourly both peri-operatively and post-operatively as tolerated  - Early ambulation and physical therapy as tolerated post-operatively - Adequate pain control especially in the setting of abdominal and thoracic surgery - Bronchodilators as needed for wheezing or shortness of breath - Oral steroids only if the patient appears to have component of COPD exacerbation, otherwise no routine utilization needed - Intraoperatively keep OR time to the shortest as possible   Preoperative Risk Calculation: Postoperative respiratory failure (PRF) is considered as failure to wean from mechanical ventilation within 48 hours of surgery or unplanned intubation/reintubation postoperatively. The validated risk calculator provides a risk estimate of PRF and is anticipated to aid in surgical decision-making and informed patient consent.  However risk can be accepted given the potential benefit of this intervention and it is not prohibitive.  The features of this patient's history that contribute to the pulmonary risk assessment include:  Age, COPD, General anesthesia  0 to 25 points: Low risk: 1.6% pulmonary complication rate (3 points) 26 to 44 points: Intermediate risk: 13.3% pulmonary complication rate  45 to 123 points: High risk: 42.1% pulmonary complication rate   ARISCAT: Mazo et al. Anesthesiology 2014; [id number]  Durel Salts, MD Pulmonary and Critical Care Medicine [facility] 04/07/2023 6:34 PM Pager: see AMION  If no  response to pager, please call critical care on call (see AMION) until 7pm After 7:00 pm call Elink

## 2023-04-08 DIAGNOSIS — I10 Essential (primary) hypertension: Secondary | ICD-10-CM | POA: Diagnosis not present

## 2023-04-10 ENCOUNTER — Other Ambulatory Visit: Payer: Self-pay

## 2023-04-10 NOTE — Progress Notes (Signed)
 Specialty Pharmacy Ongoing Clinical Assessment Note  James Barker is a 73 y.o. male who is being followed by the specialty pharmacy service for RxSp Asthma/COPD   Patient's specialty medication(s) reviewed today: Dupilumab (Dupixent)   Missed doses in the last 4 weeks: 0   Patient/Caregiver did not have any additional questions or concerns.   Therapeutic benefit summary: Patient is achieving benefit   Adverse events/side effects summary: No adverse events/side effects   Patient's therapy is appropriate to: Continue    Goals Addressed             This Visit's Progress    Minimize recurrence of flares       Patient is on track. Patient will maintain adherence. Patient states that he still experiences wheezing but that is normal for him. He has seen a difference though as he has not had to take Prednisone.          Follow up:  6 months  Otto Herb Specialty Pharmacist

## 2023-04-10 NOTE — Progress Notes (Signed)
 Specialty Pharmacy Refill Coordination Note  James Barker is a 73 y.o. male contacted today regarding refills of specialty medication(s) Dupilumab (Dupixent)   Patient requested Delivery   Delivery date: 04/17/23   Verified address: [address] [place]   Medication will be filled on 03.26.25.

## 2023-04-11 NOTE — Telephone Encounter (Signed)
 Copy of this encounter faxed to [facility]

## 2023-04-16 ENCOUNTER — Other Ambulatory Visit: Payer: Self-pay

## 2023-04-16 ENCOUNTER — Telehealth: Payer: Self-pay

## 2023-04-16 NOTE — Telephone Encounter (Signed)
 Copied from CRM [id number]. Topic: Clinical - Medical Advice >> Apr 16, 2023  1:18 PM James Barker wrote: Reason for CRM: Patient is having hand surgery on 04/02 and is inquiring about the proper protocol for taking his Eliquis. Patient states that he does not know if he should stop taking it or not and, if so, when he should stop taking it. Patient requesting to be advised.  Last seen 1/8

## 2023-04-18 NOTE — Telephone Encounter (Signed)
 Copied from CRM [id number]. Topic: Clinical - Medical Advice >> Apr 18, 2023  3:08 PM Nila Nephew wrote: Reason for CRM: [facility] is calling to request verbal permission of surgical form be sent back ASAP, as patient is scheduled for surgery 04/02. States [facility] faxed the form back with notes but no information on 03/24. They can be reached at [phone].

## 2023-04-18 NOTE — Telephone Encounter (Signed)
 Copied from CRM [id number]. Topic: Clinical  >> Apr 18, 2023 11:41 AM Shelbie Goostree wrote: Patient [phone] is following up, called 04/16/23, has not heard from the office. Patient needs Dr. Humphrey Rolls consult on Eliquis instructions prior to his upcoming hand surgery 04/23/23. Please advise. Per CAL, send a message an urgent to clinical.    Verlon Au, please advise if you have seen any paperwork about pt's upcoming surgery in the surgical clearance paperwork or if not, then we can check with Dr. Celine Mans.  Will go ahead and route this to both [place] as well as Dr. Celine Mans.

## 2023-04-21 NOTE — Telephone Encounter (Signed)
 Please see previous telephone encounter- the risk assessment has already been faxed to [facility].

## 2023-04-23 DIAGNOSIS — M2429 Disorder of ligament, other specified site: Secondary | ICD-10-CM | POA: Diagnosis not present

## 2023-04-23 DIAGNOSIS — M1811 Unilateral primary osteoarthritis of first carpometacarpal joint, right hand: Secondary | ICD-10-CM | POA: Diagnosis not present

## 2023-04-23 DIAGNOSIS — G8918 Other acute postprocedural pain: Secondary | ICD-10-CM | POA: Diagnosis not present

## 2023-04-23 DIAGNOSIS — M24841 Other specific joint derangements of right hand, not elsewhere classified: Secondary | ICD-10-CM | POA: Diagnosis not present

## 2023-04-26 DIAGNOSIS — M79641 Pain in right hand: Secondary | ICD-10-CM | POA: Diagnosis not present

## 2023-04-30 ENCOUNTER — Other Ambulatory Visit (HOSPITAL_COMMUNITY): Payer: Self-pay

## 2023-04-30 ENCOUNTER — Other Ambulatory Visit: Payer: Self-pay | Admitting: Internal Medicine

## 2023-04-30 MED ORDER — FLUTICASONE-SALMETEROL 250-50 MCG/ACT IN AEPB
1.0000 | INHALATION_SPRAY | Freq: Two times a day (BID) | RESPIRATORY_TRACT | 11 refills | Status: DC
  Filled 2023-04-30: qty 60, 30d supply, fill #0

## 2023-04-30 NOTE — Telephone Encounter (Signed)
 Copied from CRM [id number]. Topic: Clinical - Medication Refill >> Apr 30, 2023 11:22 AM Duncan Dull wrote: Most Recent Primary Care Visit:   Medication: fluticasone-salmeterol Monte Fantasia [facility])   Has the patient contacted their pharmacy? Yes (Agent: If no, request that the patient contact the pharmacy for the refill. If patient does not wish to contact the pharmacy document the reason why and proceed with request.) (Agent: If yes, when and what did the pharmacy advise?)  Is this the correct pharmacy for this prescription? Yes If no, delete pharmacy and type the correct one.  This is the patient's preferred pharmacy:   [facility] [address] [place], [place] - [address] AT [facility] [address] [place] [address] Phone: [phone] Fax: [phone]   Has the prescription been filled recently? No  Is the patient out of the medication? No  Has the patient been seen for an appointment in the last year OR does the patient have an upcoming appointment? Yes  Can we respond through MyChart? Yes  Agent: Please be advised that Rx refills may take up to 3 business days. We ask that you follow-up with your pharmacy.

## 2023-05-01 ENCOUNTER — Other Ambulatory Visit (HOSPITAL_COMMUNITY): Payer: Self-pay

## 2023-05-01 ENCOUNTER — Telehealth: Payer: Self-pay

## 2023-05-01 DIAGNOSIS — M1811 Unilateral primary osteoarthritis of first carpometacarpal joint, right hand: Secondary | ICD-10-CM | POA: Diagnosis not present

## 2023-05-01 DIAGNOSIS — M25641 Stiffness of right hand, not elsewhere classified: Secondary | ICD-10-CM | POA: Diagnosis not present

## 2023-05-01 DIAGNOSIS — M25541 Pain in joints of right hand: Secondary | ICD-10-CM | POA: Diagnosis not present

## 2023-05-01 MED ORDER — FLUTICASONE-SALMETEROL 250-50 MCG/ACT IN AEPB: 1.0000 | INHALATION_SPRAY | Freq: Two times a day (BID) | RESPIRATORY_TRACT | 11 refills | Status: AC

## 2023-05-01 NOTE — Telephone Encounter (Signed)
 Copied from CRM [id number]. Topic: Clinical - Prescription Issue >> Apr 30, 2023  3:24 PM Brennan Bailey S wrote: Reason for CRM: patient is calling because the prescription he requested today was sent to the wrong pharmacy,  Correct pharmacy: [id number] [place], [place] - [address] AT [facility]  [address]  [place] [address]  Phone: [phone] Fax: [phone]  Pt states his Monte Fantasia was sent to the wrong pharmacy. He would prefer to use [facility] pharmacy for now on. I informed pt that I would call [facility] to cancel that order and call it in to [facility]. Pt verbalized understanding. NFN  I called [facility] and informed them that the pt's Wixela RX needs to be cancelled. I was informed they had cancelled it and NFN.    Order placed for [facility]. NFN

## 2023-05-08 DIAGNOSIS — M1811 Unilateral primary osteoarthritis of first carpometacarpal joint, right hand: Secondary | ICD-10-CM | POA: Diagnosis not present

## 2023-05-08 DIAGNOSIS — M25541 Pain in joints of right hand: Secondary | ICD-10-CM | POA: Diagnosis not present

## 2023-05-08 DIAGNOSIS — M25641 Stiffness of right hand, not elsewhere classified: Secondary | ICD-10-CM | POA: Diagnosis not present

## 2023-05-13 ENCOUNTER — Other Ambulatory Visit: Payer: Self-pay

## 2023-05-13 ENCOUNTER — Other Ambulatory Visit: Payer: Self-pay | Admitting: Pharmacy Technician

## 2023-05-13 DIAGNOSIS — M1811 Unilateral primary osteoarthritis of first carpometacarpal joint, right hand: Secondary | ICD-10-CM | POA: Diagnosis not present

## 2023-05-13 DIAGNOSIS — M25641 Stiffness of right hand, not elsewhere classified: Secondary | ICD-10-CM | POA: Diagnosis not present

## 2023-05-13 DIAGNOSIS — M25541 Pain in joints of right hand: Secondary | ICD-10-CM | POA: Diagnosis not present

## 2023-05-13 NOTE — Progress Notes (Signed)
 Specialty Pharmacy Refill Coordination Note  James Barker is a 73 y.o. male contacted today regarding refills of specialty medication(s) Dupilumab  (Dupixent )   Patient requested (Patient-Rptd) Delivery   Delivery date: (Patient-Rptd) 05/19/23 ** New Delivery Date 05/20/23 Spoke with Patient & aware.**   Verified address: (Patient-Rptd) [address], [place]  [id number]   Medication will be filled on 05/19/23.

## 2023-05-23 DIAGNOSIS — M25541 Pain in joints of right hand: Secondary | ICD-10-CM | POA: Diagnosis not present

## 2023-05-23 DIAGNOSIS — M1811 Unilateral primary osteoarthritis of first carpometacarpal joint, right hand: Secondary | ICD-10-CM | POA: Diagnosis not present

## 2023-05-23 DIAGNOSIS — M25641 Stiffness of right hand, not elsewhere classified: Secondary | ICD-10-CM | POA: Diagnosis not present

## 2023-05-27 DIAGNOSIS — M1811 Unilateral primary osteoarthritis of first carpometacarpal joint, right hand: Secondary | ICD-10-CM | POA: Diagnosis not present

## 2023-05-27 DIAGNOSIS — M25641 Stiffness of right hand, not elsewhere classified: Secondary | ICD-10-CM | POA: Diagnosis not present

## 2023-05-27 DIAGNOSIS — M25541 Pain in joints of right hand: Secondary | ICD-10-CM | POA: Diagnosis not present

## 2023-05-29 ENCOUNTER — Emergency Department (HOSPITAL_COMMUNITY)

## 2023-05-29 ENCOUNTER — Inpatient Hospital Stay (HOSPITAL_COMMUNITY)

## 2023-05-29 ENCOUNTER — Other Ambulatory Visit: Payer: Self-pay

## 2023-05-29 ENCOUNTER — Observation Stay (HOSPITAL_COMMUNITY)
Admission: EM | Admit: 2023-05-29 | Discharge: 2023-05-30 | Disposition: A | Discharge status: Home / Self Care | Attending: Family Medicine | Admitting: Family Medicine

## 2023-05-29 ENCOUNTER — Encounter (HOSPITAL_COMMUNITY): Payer: Self-pay

## 2023-05-29 DIAGNOSIS — J9601 Acute respiratory failure with hypoxia: Secondary | ICD-10-CM | POA: Diagnosis present

## 2023-05-29 DIAGNOSIS — R079 Chest pain, unspecified: Secondary | ICD-10-CM | POA: Diagnosis not present

## 2023-05-29 DIAGNOSIS — R0603 Acute respiratory distress: Secondary | ICD-10-CM | POA: Diagnosis not present

## 2023-05-29 DIAGNOSIS — Z1152 Encounter for screening for COVID-19: Secondary | ICD-10-CM | POA: Diagnosis not present

## 2023-05-29 DIAGNOSIS — Z79899 Other long term (current) drug therapy: Secondary | ICD-10-CM | POA: Insufficient documentation

## 2023-05-29 DIAGNOSIS — K219 Gastro-esophageal reflux disease without esophagitis: Secondary | ICD-10-CM | POA: Diagnosis not present

## 2023-05-29 DIAGNOSIS — S2231XA Fracture of one rib, right side, initial encounter for closed fracture: Secondary | ICD-10-CM | POA: Diagnosis not present

## 2023-05-29 DIAGNOSIS — I10 Essential (primary) hypertension: Secondary | ICD-10-CM | POA: Diagnosis present

## 2023-05-29 DIAGNOSIS — I129 Hypertensive chronic kidney disease with stage 1 through stage 4 chronic kidney disease, or unspecified chronic kidney disease: Secondary | ICD-10-CM | POA: Insufficient documentation

## 2023-05-29 DIAGNOSIS — J9811 Atelectasis: Secondary | ICD-10-CM | POA: Diagnosis not present

## 2023-05-29 DIAGNOSIS — Z87891 Personal history of nicotine dependence: Secondary | ICD-10-CM | POA: Insufficient documentation

## 2023-05-29 DIAGNOSIS — J939 Pneumothorax, unspecified: Secondary | ICD-10-CM | POA: Diagnosis not present

## 2023-05-29 DIAGNOSIS — Z95828 Presence of other vascular implants and grafts: Secondary | ICD-10-CM

## 2023-05-29 DIAGNOSIS — R059 Cough, unspecified: Secondary | ICD-10-CM | POA: Diagnosis not present

## 2023-05-29 DIAGNOSIS — N1831 Chronic kidney disease, stage 3a: Secondary | ICD-10-CM | POA: Diagnosis not present

## 2023-05-29 DIAGNOSIS — Z86711 Personal history of pulmonary embolism: Secondary | ICD-10-CM | POA: Insufficient documentation

## 2023-05-29 DIAGNOSIS — Z9221 Personal history of antineoplastic chemotherapy: Secondary | ICD-10-CM | POA: Insufficient documentation

## 2023-05-29 DIAGNOSIS — J441 Chronic obstructive pulmonary disease with (acute) exacerbation: Secondary | ICD-10-CM | POA: Diagnosis not present

## 2023-05-29 DIAGNOSIS — Z8551 Personal history of malignant neoplasm of bladder: Secondary | ICD-10-CM

## 2023-05-29 DIAGNOSIS — R0989 Other specified symptoms and signs involving the circulatory and respiratory systems: Secondary | ICD-10-CM | POA: Diagnosis not present

## 2023-05-29 DIAGNOSIS — N179 Acute kidney failure, unspecified: Secondary | ICD-10-CM | POA: Diagnosis present

## 2023-05-29 DIAGNOSIS — J449 Chronic obstructive pulmonary disease, unspecified: Secondary | ICD-10-CM | POA: Diagnosis present

## 2023-05-29 DIAGNOSIS — R069 Unspecified abnormalities of breathing: Secondary | ICD-10-CM | POA: Diagnosis not present

## 2023-05-29 DIAGNOSIS — R6889 Other general symptoms and signs: Secondary | ICD-10-CM | POA: Diagnosis not present

## 2023-05-29 DIAGNOSIS — R0602 Shortness of breath: Secondary | ICD-10-CM | POA: Diagnosis not present

## 2023-05-29 DIAGNOSIS — R0689 Other abnormalities of breathing: Secondary | ICD-10-CM | POA: Diagnosis not present

## 2023-05-29 DIAGNOSIS — Z743 Need for continuous supervision: Secondary | ICD-10-CM | POA: Diagnosis not present

## 2023-05-29 LAB — CK: Total CK: 141 U/L (ref 49–397)

## 2023-05-29 LAB — CBC WITH DIFFERENTIAL/PLATELET
Abs Immature Granulocytes: 0.01 10*3/uL (ref 0.00–0.07)
Basophils Absolute: 0 10*3/uL (ref 0.0–0.1)
Basophils Relative: 1 %
Eosinophils Absolute: 0.1 10*3/uL (ref 0.0–0.5)
Eosinophils Relative: 2 %
HCT: 27.3 % — ABNORMAL LOW (ref 39.0–52.0)
Hemoglobin: 8.9 g/dL — ABNORMAL LOW (ref 13.0–17.0)
Immature Granulocytes: 0 %
Lymphocytes Relative: 21 %
Lymphs Abs: 1.1 10*3/uL (ref 0.7–4.0)
MCH: 30.3 pg (ref 26.0–34.0)
MCHC: 32.6 g/dL (ref 30.0–36.0)
MCV: 92.9 fL (ref 80.0–100.0)
Monocytes Absolute: 0.2 10*3/uL (ref 0.1–1.0)
Monocytes Relative: 3 %
Neutro Abs: 3.9 10*3/uL (ref 1.7–7.7)
Neutrophils Relative %: 73 %
Platelets: 118 10*3/uL — ABNORMAL LOW (ref 150–400)
RBC: 2.94 MIL/uL — ABNORMAL LOW (ref 4.22–5.81)
RDW: 13.4 % (ref 11.5–15.5)
WBC: 5.3 10*3/uL (ref 4.0–10.5)
nRBC: 0 % (ref 0.0–0.2)

## 2023-05-29 LAB — TYPE AND SCREEN
ABO/RH(D): A POS
Antibody Screen: NEGATIVE

## 2023-05-29 LAB — TROPONIN I (HIGH SENSITIVITY)
Troponin I (High Sensitivity): 2 ng/L (ref <18)
Troponin I (High Sensitivity): 3 ng/L (ref <18)

## 2023-05-29 LAB — COMPREHENSIVE METABOLIC PANEL WITH GFR
ALT: 14 U/L (ref 0–44)
AST: 31 U/L (ref 15–41)
Albumin: 3.8 g/dL (ref 3.5–5.0)
Alkaline Phosphatase: 53 U/L (ref 38–126)
Anion gap: 10 (ref 5–15)
BUN: 24 mg/dL — ABNORMAL HIGH (ref 8–23)
CO2: 18 mmol/L — ABNORMAL LOW (ref 22–32)
Calcium: 8.4 mg/dL — ABNORMAL LOW (ref 8.9–10.3)
Chloride: 108 mmol/L (ref 98–111)
Creatinine, Ser: 1.37 mg/dL — ABNORMAL HIGH (ref 0.61–1.24)
GFR, Estimated: 54 mL/min — ABNORMAL LOW (ref >60)
Glucose, Bld: 165 mg/dL — ABNORMAL HIGH (ref 70–99)
Potassium: 3.7 mmol/L (ref 3.5–5.1)
Sodium: 136 mmol/L (ref 135–145)
Total Bilirubin: 0.5 mg/dL (ref 0.0–1.2)
Total Protein: 6.8 g/dL (ref 6.5–8.1)

## 2023-05-29 LAB — RESP PANEL BY RT-PCR (RSV, FLU A&B, COVID)  RVPGX2
Influenza A by PCR: NEGATIVE
Influenza B by PCR: NEGATIVE
Resp Syncytial Virus by PCR: NEGATIVE
SARS Coronavirus 2 by RT PCR: NEGATIVE

## 2023-05-29 LAB — BLOOD GAS, VENOUS
Acid-base deficit: 9.3 mmol/L — ABNORMAL HIGH (ref 0.0–2.0)
Bicarbonate: 15.5 mmol/L — ABNORMAL LOW (ref 20.0–28.0)
O2 Saturation: 87.6 %
Patient temperature: 37
pCO2, Ven: 30 mmHg — ABNORMAL LOW (ref 44–60)
pH, Ven: 7.32 (ref 7.25–7.43)
pO2, Ven: 53 mmHg — ABNORMAL HIGH (ref 32–45)

## 2023-05-29 LAB — MRSA NEXT GEN BY PCR, NASAL: MRSA by PCR Next Gen: NOT DETECTED

## 2023-05-29 LAB — PROCALCITONIN: Procalcitonin: <0.1 ng/mL

## 2023-05-29 LAB — RETICULOCYTES
Immature Retic Fract: 7.2 % (ref 2.3–15.9)
RBC.: 3.98 MIL/uL — ABNORMAL LOW (ref 4.22–5.81)
Retic Count, Absolute: 36.2 10*3/uL (ref 19.0–186.0)
Retic Ct Pct: 0.9 % (ref 0.4–3.1)

## 2023-05-29 LAB — ABO/RH: ABO/RH(D): A POS

## 2023-05-29 LAB — D-DIMER, QUANTITATIVE: D-Dimer, Quant: <0.27 ug{FEU}/mL (ref 0.00–0.50)

## 2023-05-29 LAB — PHOSPHORUS: Phosphorus: 2.5 mg/dL (ref 2.5–4.6)

## 2023-05-29 LAB — MAGNESIUM: Magnesium: 2.4 mg/dL (ref 1.7–2.4)

## 2023-05-29 LAB — BRAIN NATRIURETIC PEPTIDE: B Natriuretic Peptide: 24.3 pg/mL (ref 0.0–100.0)

## 2023-05-29 MED ORDER — ACETAMINOPHEN 650 MG RE SUPP: 650.0000 mg | Freq: Four times a day (QID) | RECTAL | Status: DC | PRN

## 2023-05-29 MED ORDER — ONDANSETRON HCL 4 MG/2ML IJ SOLN: 4.0000 mg | Freq: Four times a day (QID) | INTRAMUSCULAR | Status: DC | PRN

## 2023-05-29 MED ORDER — AMLODIPINE BESYLATE 5 MG PO TABS
10.0000 mg | ORAL_TABLET | Freq: Every day | ORAL | Status: DC
  Administered 2023-05-29 – 2023-05-30 (×2): 10 mg via ORAL
  Filled 2023-05-29 (×2): qty 2

## 2023-05-29 MED ORDER — GUAIFENESIN ER 600 MG PO TB12
600.0000 mg | ORAL_TABLET | Freq: Two times a day (BID) | ORAL | Status: DC
  Administered 2023-05-29 – 2023-05-30 (×2): 600 mg via ORAL
  Filled 2023-05-29 (×2): qty 1

## 2023-05-29 MED ORDER — IPRATROPIUM-ALBUTEROL 0.5-2.5 (3) MG/3ML IN SOLN
3.0000 mL | Freq: Once | RESPIRATORY_TRACT | Status: AC
  Administered 2023-05-29: 3 mL via RESPIRATORY_TRACT
  Filled 2023-05-29: qty 3

## 2023-05-29 MED ORDER — ALBUTEROL SULFATE (2.5 MG/3ML) 0.083% IN NEBU
2.5000 mg | INHALATION_SOLUTION | RESPIRATORY_TRACT | Status: DC | PRN
Start: 2023-05-29 — End: 2023-05-29

## 2023-05-29 MED ORDER — METHOCARBAMOL 1000 MG/10ML IJ SOLN
500.0000 mg | Freq: Four times a day (QID) | INTRAMUSCULAR | Status: DC | PRN
  Administered 2023-05-29: 500 mg via INTRAVENOUS
  Filled 2023-05-29: qty 10

## 2023-05-29 MED ORDER — PREDNISONE 20 MG PO TABS: 40.0000 mg | ORAL_TABLET | Freq: Every day | ORAL | Status: DC

## 2023-05-29 MED ORDER — APIXABAN 5 MG PO TABS
5.0000 mg | ORAL_TABLET | Freq: Two times a day (BID) | ORAL | Status: DC
  Administered 2023-05-29 – 2023-05-30 (×2): 5 mg via ORAL
  Filled 2023-05-29 (×2): qty 1

## 2023-05-29 MED ORDER — ALBUTEROL SULFATE (2.5 MG/3ML) 0.083% IN NEBU: 2.5000 mg | INHALATION_SOLUTION | RESPIRATORY_TRACT | Status: DC | PRN

## 2023-05-29 MED ORDER — ACETAMINOPHEN 325 MG PO TABS: 650.0000 mg | ORAL_TABLET | Freq: Four times a day (QID) | ORAL | Status: DC | PRN

## 2023-05-29 MED ORDER — SODIUM CHLORIDE 0.9 % IV SOLN: 500.0000 mg | INTRAVENOUS | Status: DC

## 2023-05-29 MED ORDER — ONDANSETRON HCL 4 MG PO TABS: 4.0000 mg | ORAL_TABLET | Freq: Four times a day (QID) | ORAL | Status: DC | PRN

## 2023-05-29 MED ORDER — ATORVASTATIN CALCIUM 10 MG PO TABS
20.0000 mg | ORAL_TABLET | Freq: Every day | ORAL | Status: DC
  Administered 2023-05-29: 20 mg via ORAL
  Filled 2023-05-29 (×2): qty 2

## 2023-05-29 MED ORDER — SODIUM CHLORIDE 0.9 % IV SOLN: INTRAVENOUS | Status: AC

## 2023-05-29 MED ORDER — SODIUM CHLORIDE 0.9 % IV SOLN
500.0000 mg | Freq: Once | INTRAVENOUS | Status: AC
  Administered 2023-05-29: 500 mg via INTRAVENOUS
  Filled 2023-05-29: qty 5

## 2023-05-29 MED ORDER — IPRATROPIUM-ALBUTEROL 0.5-2.5 (3) MG/3ML IN SOLN: 3.0000 mL | Freq: Four times a day (QID) | RESPIRATORY_TRACT | Status: DC

## 2023-05-29 MED ORDER — MAGNESIUM SULFATE 2 GM/50ML IV SOLN
2.0000 g | Freq: Once | INTRAVENOUS | Status: AC
  Administered 2023-05-29: 2 g via INTRAVENOUS
  Filled 2023-05-29: qty 50

## 2023-05-29 MED ORDER — HYDROCODONE-ACETAMINOPHEN 5-325 MG PO TABS
1.0000 | ORAL_TABLET | ORAL | Status: DC | PRN
  Administered 2023-05-29 – 2023-05-30 (×4): 2 via ORAL
  Filled 2023-05-29 (×4): qty 2

## 2023-05-29 MED ORDER — IPRATROPIUM-ALBUTEROL 0.5-2.5 (3) MG/3ML IN SOLN
3.0000 mL | Freq: Four times a day (QID) | RESPIRATORY_TRACT | Status: DC
  Administered 2023-05-30: 3 mL via RESPIRATORY_TRACT
  Filled 2023-05-29: qty 3

## 2023-05-29 MED ORDER — MORPHINE SULFATE (PF) 4 MG/ML IV SOLN
4.0000 mg | Freq: Once | INTRAVENOUS | Status: AC
  Administered 2023-05-29: 4 mg via INTRAVENOUS
  Filled 2023-05-29: qty 1

## 2023-05-29 MED ORDER — METHYLPREDNISOLONE SODIUM SUCC 40 MG IJ SOLR
40.0000 mg | Freq: Two times a day (BID) | INTRAMUSCULAR | Status: AC
  Administered 2023-05-30 (×2): 40 mg via INTRAVENOUS
  Filled 2023-05-29 (×2): qty 1

## 2023-05-29 MED ORDER — SODIUM CHLORIDE 0.9 % IV BOLUS
1000.0000 mL | Freq: Once | INTRAVENOUS | Status: AC
  Administered 2023-05-29: 1000 mL via INTRAVENOUS

## 2023-05-29 NOTE — ED Provider Notes (Signed)
  Physical Exam  BP 136/79 (BP Location: Right Arm)   Pulse 68   Temp 98.2 F (36.8 C) (Axillary)   Resp (!) 23   SpO2 100%   Physical Exam Vitals and nursing note reviewed.  HENT:     Head: Normocephalic and atraumatic.  Eyes:     Pupils: Pupils are equal, round, and reactive to light.  Cardiovascular:     Rate and Rhythm: Normal rate and regular rhythm.  Pulmonary:     Effort: Pulmonary effort is normal.     Breath sounds: Normal breath sounds.  Abdominal:     Palpations: Abdomen is soft.     Tenderness: There is no abdominal tenderness.  Skin:    General: Skin is warm and dry.  Neurological:     Mental Status: He is alert.  Psychiatric:        Mood and Affect: Mood normal.     Procedures  Procedures  ED Course / MDM   Clinical Course as of 05/29/23 1845  Thu May 29, 2023  1641 Breathing improving, will trial [facility] and see how he tolerates [SG]  1824 Patient has remained stable on O2 nasal cannula he does however state he has significant pain now from coughing.  Will obtain repeat chest x-ray to make sure there is no pneumothorax and give 1 dose of morphine.  Laboratory workup shows no leukocytosis hemoglobin at baseline, respiratory viral panel negative D-dimer negative not consistent with PE troponin negative not consistent with ACS.  Will admit for likely COPD exacerbation and new O2 requirement [MP]  1844 Discussed admitting hospitalist accepts patient for admission and recommends 1 dose of azithromycin  given history of MAI [MP]    Clinical Course User Index [MP] Sallyanne Creamer, DO [SG] Teddi Favors, DO   Medical Decision Making I, Rafael Bun DO, have assumed care of this patient from the previous provider pending laboratory workup, reevaluation and likely admission  Amount and/or Complexity of Data Reviewed Labs: ordered. Radiology: ordered.  Risk Prescription drug management. Decision regarding hospitalization.          Ivann, Guadagn[place],  DO 05/29/23 1845

## 2023-05-29 NOTE — H&P (Addendum)
 James Barker:[id number] DOB: 1950/01/26 DOA: 05/29/2023     PCP: Sinda Duel, PA   Outpatient Specialists:    Pulmonary  Dr.Desai ID Dr. Gillian Lacrosse   Patient arrived to ER on 05/29/23 at 1524 Referred by Attending Selene Dais, MD   Patient coming from:    home Lives  With family     Chief Complaint:   Chief Complaint  Patient presents with   Respiratory Distress    HPI: James Barker is a 73 y.o. 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    Presented with  shortness of breath, cough Presents with respiratory shortness of breath body aches and chills feeling tight in the chest increase work of breathing with retractions patient at baseline not on oxygen EMS started on 7 L Known history of COPD history of PE status post IVC filter and MAI infection      Denies significant ETOH intake   Does not smoke former      Regarding pertinent Chronic problems:     Hyperlipidemia -  on statins Lipitor (atorvastatin )  Lipid Panel  No results found for: "CHOL", "TRIG", "HDL", "CHOLHDL", "VLDL", "LDLCALC", "LDLDIRECT", "LABVLDL"   HTN on norvasc , lisinopril   - last echo  Recent Results (from the past 95621 hours)  ECHOCARDIOGRAM COMPLETE   Collection Time: 05/16/21  8:57 AM  Result Value   Weight 3,086.57   Height 66   BP 110/77   Single Plane A2C EF 58.9   Single Plane A4C EF 70.0   Calc EF 64.4   S' Lateral 2.50   AR max vel 2.97   AV Area VTI 2.88   AV Mean grad 5.0   AV Peak grad 9.6   Ao pk vel 1.55   Area-P 1/2 3.48   AV Area mean vel 2.78   Narrative      ECHOCARDIOGRAM REPORT         1. Left ventricular ejection fraction, by estimation, is 60 to 65%. The left ventricle has normal function. The left ventricle has no regional wall motion abnormalities. Left ventricular diastolic parameters were normal.  The average left ventricular  global longitudinal strain is -23.2 %. The global longitudinal strain is normal.  2. Right ventricular systolic function is normal. The right ventricular size is normal. There is normal pulmonary artery systolic pressure.  3. The mitral valve is normal in structure. No evidence of mitral valve regurgitation. No evidence of mitral stenosis.  4. The aortic valve is normal in structure. Aortic valve regurgitation is not visualized. No aortic stenosis is present.  5. Aortic dilatation noted. There is mild dilatation of the aortic root, measuring 38 mm.  6. The inferior vena cava is normal in size with greater than 50% respiratory variability, suggesting right atrial pressure of 3 mmHg.               Asthma  COPD -  followed by pulmonology   not  on baseline oxygen       Hx of DVT/PE on - anticoagulation with  Eliquis ,      CKD stage IIIa   baseline Cr 1.2 CrCl cannot be calculated (Patient's most recent lab result is older than the maximum 21 days allowed.).  Lab Results  Component Value Date   CREATININE 1.21 06/26/2022   CREATININE 1.13 06/25/2022  CREATININE 1.21 06/24/2022   Lab Results  Component Value Date   NA 136 06/26/2022   CL 105 06/26/2022   K 4.9 06/26/2022   CO2 22 06/26/2022   BUN 32 (H) 06/26/2022   CREATININE 1.21 06/26/2022   GFRNONAA >60 06/26/2022   CALCIUM  8.9 06/26/2022   PHOS 4.3 06/26/2022   ALBUMIN 3.5 06/26/2022   GLUCOSE 109 (H) 06/26/2022      BPH - bladder cancer sp turp    Chronic anemia - baseline hg Hemoglobin & Hematocrit  Recent Labs    06/25/22 0537 06/26/22 0523 05/29/23 1621  HGB 11.7* 12.7* 8.9*   Iron/TIBC/Ferritin/ %Sat    Component Value Date/Time   IRON 36 (L) 05/29/2023 2213   TIBC 354 05/29/2023 2213   FERRITIN 35 05/29/2023 2213   IRONPCTSAT 10 (L) 05/29/2023 2213     Cancer: Bladder cancer sp TURP in remission     While in ER: Clinical Course as of 05/29/23 1909  Thu May 29, 2023   1641 Breathing improving, will trial [facility] and see how he tolerates [SG]  1824 Patient has remained stable on O2 nasal cannula he does however state he has significant pain now from coughing.  Will obtain repeat chest x-ray to make sure there is no pneumothorax and give 1 dose of morphine .  Laboratory workup shows no leukocytosis hemoglobin at baseline, respiratory viral panel negative D-dimer negative not consistent with PE troponin negative not consistent with ACS.  Will admit for likely COPD exacerbation and new O2 requirement [MP]  1844 Discussed admitting hospitalist accepts patient for admission and recommends 1 dose of azithromycin  given history of MAI [MP]    Clinical Course User Index [MP] Mckaden, Delon, DO [SG] Teddi Favors, DO         Lab Orders         Resp panel by RT-PCR (RSV, Flu A&B, Covid) Anterior Nasal Swab         Respiratory (~20 pathogens) panel by PCR         Blood gas, venous (at [facility])         CBC with Differential         Brain natriuretic peptide         D-dimer, quantitative         Comprehensive metabolic panel with GFR         Magnesium          Phosphorus         CK         Vitamin B12         Folate         Iron and TIBC         Ferritin         Reticulocytes         CXR -  NON acute    CTA chest - ordered  Following Medications were ordered in ER: Medications  azithromycin  (ZITHROMAX ) 500 mg in sodium chloride  0.9 % 250 mL IVPB (has no administration in time range)  magnesium  sulfate IVPB 2 g 50 mL (0 g Intravenous Stopped 05/29/23 1717)  ipratropium-albuterol  (DUONEB) 0.5-2.5 (3) MG/3ML nebulizer solution 3 mL (3 mLs Nebulization Given 05/29/23 1617)  sodium chloride  0.9 % bolus 1,000 mL (0 mLs Intravenous Stopped 05/29/23 1730)  morphine  (PF) 4 MG/ML injection 4 mg (4 mg Intravenous Given 05/29/23 1845)    ____    ED Triage Vitals  Encounter Vitals Group  BP 05/29/23 1535 (!) 151/133     Systolic BP Percentile --      Diastolic  BP Percentile --      Pulse Rate 05/29/23 1535 82     Resp 05/29/23 1535 (!) 23     Temp 05/29/23 1535 98.2 F (36.8 C)     Temp Source 05/29/23 1535 Axillary     SpO2 05/29/23 1536 98 %     Weight --      Height --      Head Circumference --      Peak Flow --      Pain Score 05/29/23 1845 10     Pain Loc --      Pain Education --      Exclude from Growth Chart --   EAVW(09)@     _________________________________________ Significant initial  Findings: Abnormal Labs Reviewed  BLOOD GAS, VENOUS - Abnormal; Notable for the following components:      Result Value   pCO2, Ven 30 (*)    pO2, Ven 53 (*)    Bicarbonate 15.5 (*)    Acid-base deficit 9.3 (*)    All other components within normal limits  CBC WITH DIFFERENTIAL/PLATELET - Abnormal; Notable for the following components:   RBC 2.94 (*)    Hemoglobin 8.9 (*)    HCT 27.3 (*)    Platelets 118 (*)    All other components within normal limits  COMPREHENSIVE METABOLIC PANEL WITH GFR - Abnormal; Notable for the following components:   CO2 18 (*)    Glucose, Bld 165 (*)    BUN 24 (*)    Creatinine, Ser 1.37 (*)    Calcium  8.4 (*)    GFR, Estimated 54 (*)    All other components within normal limits  IRON AND TIBC - Abnormal; Notable for the following components:   Iron 36 (*)    Saturation Ratios 10 (*)    All other components within normal limits  RETICULOCYTES - Abnormal; Notable for the following components:   RBC. 3.98 (*)    All other components within normal limits      ___ Troponin  ordered Cardiac Panel (last 3 results) Recent Labs    05/29/23 1621 05/29/23 1721  TROPONINIHS 2 3     ECG: Ordered Personally reviewed and interpreted by me showing: HR : 73 Rhythm:Sinus rhythm Minimal ST depression, anterolateral leads QTC 453  BNP (last 3 results) Recent Labs    06/24/22 1115 05/29/23 1621  BNP 127.7* 24.3     Recent Labs    05/29/23 1721  DDIMER <0.27        The recent clinical data is  shown below. Vitals:   05/29/23 1536 05/29/23 1634 05/29/23 1745 05/29/23 1845  BP:   136/79 (!) 148/77  Pulse:   68 71  Resp:    (!) 26  Temp:      TempSrc:      SpO2: 98% 98% 100% 100%      WBC     Component Value Date/Time   WBC 5.3 05/29/2023 1621   LYMPHSABS 1.1 05/29/2023 1621   MONOABS 0.2 05/29/2023 1621   EOSABS 0.1 05/29/2023 1621   BASOSABS 0.0 05/29/2023 1621     Procalcitonin   Ordered      Results for orders placed or performed during the hospital encounter of 05/29/23  Resp panel by RT-PCR (RSV, Flu A&B, Covid) Anterior Nasal Swab     Status: None   Collection Time: 05/29/23  4:21 PM   Specimen: Anterior Nasal Swab  Result Value Ref Range Status   SARS Coronavirus 2 by RT PCR NEGATIVE NEGATIVE Final         Influenza A by PCR NEGATIVE NEGATIVE Final   Influenza B by PCR NEGATIVE NEGATIVE Final         Resp Syncytial Virus by PCR NEGATIVE NEGATIVE Final          ABX started azithromycine Antibiotics Given (last 72 hours)     None        Venous  Blood Gas result:  pH  7.32 Acid-base deficit 9.3 High  mmol/L   pCO2, Ven 30 Low  mmHg O2 Saturation 87.6 %  pO2, Ven 53 High  mmHg      ABG    Component Value Date/Time   HCO3 15.5 (L) 05/29/2023 1621   TCO2 24 05/11/2021 0627   ACIDBASEDEF 9.3 (H) 05/29/2023 1621   O2SAT 87.6 05/29/2023 1621       Plt: Lab Results  Component Value Date   PLT 118 (L) 05/29/2023    Recent Labs  Lab 05/29/23 1621  WBC 5.3  NEUTROABS 3.9  HGB 8.9*  HCT 27.3*  MCV 92.9  PLT 118*    HG/HCT  stable,     Component Value Date/Time   HGB 8.9 (L) 05/29/2023 1621   HGB 11.9 (L) 10/26/2021 1150   HCT 27.3 (L) 05/29/2023 1621   MCV 92.9 05/29/2023 1621     _______________________________________________ Hospitalist was called for admission for   Respiratory failure with hypoxia    COPD with acute exacerbation (HCC)    The following Work up has been ordered so far:  Orders Placed This Encounter   Procedures   Critical Care   Resp panel by RT-PCR (RSV, Flu A&B, Covid) Anterior Nasal Swab   Respiratory (~20 pathogens) panel by PCR   DG Chest Portable 1 View   DG Chest Portable 1 View   Blood gas, venous (at [facility])   CBC with Differential   Brain natriuretic peptide   D-dimer, quantitative   Comprehensive metabolic panel with GFR   Magnesium    Phosphorus   CK   Vitamin B12   Folate   Iron and TIBC   Ferritin   Reticulocytes   Document Height and Actual Weight   If O2 Sat <94% administer O2 at 2 liters/minute via nasal cannula   Cardiac Monitoring - Continuous Indefinite   Consult to hospitalist   Droplet precaution   ED EKG   EKG 12-Lead   Admit to Inpatient (patient's expected length of stay will be greater than 2 midnights or inpatient only procedure)     OTHER Significant initial  Findings:  labs showing:     DM  labs:  HbA1C: No results for input(s): "HGBA1C" in the last 8760 hours.     CBG (last 3)  No results for input(s): "GLUCAP" in the last 72 hours.        Cultures: No results found for: "SDES", "SPECREQUEST", "CULT", "REPTSTATUS"   Radiological Exams on Admission: DG Chest Portable 1 View Result Date: 05/29/2023 CLINICAL DATA:  Shortness of breath. EXAM: PORTABLE CHEST 1 VIEW COMPARISON:  06/24/2022. FINDINGS: Low lung volumes. The heart size and mediastinal contours are within normal limits. Mild bibasilar atelectasis. No focal consolidation, sizeable pleural effusion, or pneumothorax. Remote right-sided rib fracture. No acute osseous abnormality. IMPRESSION: Low lung volumes with mild bibasilar atelectasis. Electronically Signed   By: Mannie Seek  M.D.   On: 05/29/2023 15:57   _______________________________________________________________________________________________________ Latest  Blood pressure (!) 148/77, pulse 71, temperature 98.2 F (36.8 C), temperature source Axillary, resp. rate (!) 26, SpO2 100%.   Vitals  labs and  radiology finding personally reviewed  Review of Systems:    Pertinent positives include:  chills, fatigue,  shortness of breath at rest.dyspnea on exertion, Constitutional:  No weight loss, night sweats, Fevers,  weight loss  HEENT:  No headaches, Difficulty swallowing,Tooth/dental problems,Sore throat,  No sneezing, itching, ear ache, nasal congestion, post nasal drip,  Cardio-vascular:  No chest pain, Orthopnea, PND, anasarca, dizziness, palpitations.no Bilateral lower extremity swelling  GI:  No heartburn, indigestion, abdominal pain, nausea, vomiting, diarrhea, change in bowel habits, loss of appetite, melena, blood in stool, hematemesis Resp:  no No  No excess mucus, no productive cough, No non-productive cough, No coughing up of blood.No change in color of mucus.No wheezing. Skin:  no rash or lesions. No jaundice GU:  no dysuria, change in color of urine, no urgency or frequency. No straining to urinate.  No flank pain.  Musculoskeletal:  No joint pain or no joint swelling. No decreased range of motion. No back pain.  Psych:  No change in mood or affect. No depression or anxiety. No memory loss.  Neuro: no localizing neurological complaints, no tingling, no weakness, no double vision, no gait abnormality, no slurred speech, no confusion  All systems reviewed and apart from HOPI all are negative _______________________________________________________________________________________________ Past Medical History:   Past Medical History:  Diagnosis Date   Anemia    Arthritis    Asthma-COPD overlap syndrome [facility])    pulmonology--- dr n desai;   last exacerbation 05-08-2021   Benign localized prostatic hyperplasia with lower urinary tract symptoms (LUTS)    Chronic cough    due to copd   Chronic kidney disease    Dairy product intolerance    DOE (dyspnea on exertion)    d/t asthma/ COPD   Dyspnea    Emphysema, unspecified (HCC)    Full dentures    History of bladder  cancer 2020   s/p TURBT w/ chemo instillation   History of COVID-19    per pt 2021 (no at same time of pneumonia)  w/ moderate symptoms , recovered at home, that resolved back to baseline   History of hepatitis C 2018   hx chronic hepatitis C,  per pt treated and told was cured   History of latent tuberculosis 2017   pt tested positive for TB exposure, not active, treated w/ INH for 9 months   History of MAI infection 2021   never treated   History of pulmonary embolism    (05-09-2021  pt stated never had clot prior to 2017 , none since 2020, had negative blood disorder work-up)//  2017  treated w/ xarelto 6 months//  and recurrent 04/ 2020 in setting severe UTI and deconditioning w/ prolonged admission due to intolerant to anticoagulants IVC filter inserted   Hypertension    OSA (obstructive sleep apnea)    per pt dx yrs ago, intolerant to cpap   Pneumonia    Pulmonary eosinophilia (HCC)    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   Weak urinary stream    Wears glasses       Past Surgical History:  Procedure Laterality Date   ANAL FISTULECTOMY     x5  last one 2008   BRONCHOSCOPY  2021   per pt had pneumonia   (  done in [place] )   INGUINAL HERNIA REPAIR Right 2010   IVC FILTER INSERTION  2020   in [place]   ORIF WRIST FRACTURE Left 2016   SHOULDER ARTHROSCOPY W/ ROTATOR CUFF REPAIR Left 2016   SHOULDER ARTHROSCOPY WITH ROTATOR CUFF REPAIR AND SUBACROMIAL DECOMPRESSION Left 05/09/2022   Procedure: SHOULDER ARTHROSCOPY WITH ROTATOR CUFF REPAIR AND SUBACROMIAL DECOMPRESSION;  Surgeon: Sammye Cristal, MD;  Location: [facility];  Service: Orthopedics;  Laterality: Left;   TRANSURETHRAL RESECTION OF BLADDER TUMOR  2020   in [place]   TRANSURETHRAL RESECTION OF PROSTATE  2021   in [place]   TRANSURETHRAL RESECTION OF PROSTATE N/A 05/11/2021   Procedure: PROSTATE EXAM UNDER ANESTHESIA; TRANSURETHRAL RESECTION OF THE PROSTATE (TURP), BLADDER BIOPSY;  Surgeon: Christina Coyer, MD;  Location: [facility];  Service: Urology;  Laterality: N/A;   UMBILICAL HERNIA REPAIR  2011    Social History:  Ambulatory   independently       reports that he quit smoking about 33 years ago. His smoking use included cigarettes. He started smoking about 53 years ago. He has a 20 pack-year smoking history. He has never used smokeless tobacco. He reports that he does not currently use alcohol. He reports current drug use. Drug: Marijuana.   Family History:   Family History  Problem Relation Age of Onset   Pulmonary embolism Mother    Emphysema Father    Lung cancer Sister    Asthma Child    Asthma Child    ______________________________________________________________________________________________ Allergies: Allergies  Allergen Reactions   Cheese Nausea And Vomiting   Lactose Intolerance (Gi) Diarrhea and Nausea And Vomiting     Prior to Admission medications   Medication Sig Start Date End Date Taking? Authorizing Provider  albuterol  (PROVENTIL ) (2.5 MG/3ML) 0.083% nebulizer solution Take 3 mLs (2.5 mg total) by nebulization every 6 (six) hours as needed for wheezing or shortness of breath. 04/26/21   Cobb, Mariah Shines, NP  albuterol  (VENTOLIN  HFA) 108 (90 Base) MCG/ACT inhaler TAKE 2 PUFFS BY MOUTH EVERY 6 HOURS AS NEEDED FOR WHEEZE OR SHORTNESS OF BREATH 09/19/22   Aleck Hurdle, MD  amLODipine  (NORVASC ) 10 MG tablet Take 1 tablet (10 mg total) by mouth daily. 05/21/21   Gonfa, Taye T, MD  atorvastatin  (LIPITOR) 20 MG tablet Take 20 mg by mouth daily. 12/07/20   [provider]  budesonide  (PULMICORT ) 0.25 MG/2ML nebulizer solution Use 2 mLs (0.25 mg) by nebulization 2 times daily. 06/26/22   Lorita Rosa, MD  Dupilumab  (DUPIXENT ) 300 MG/2ML SOAJ Inject 300 mg into the skin every 14 days. 02/18/23   Aleck Hurdle, MD  ELIQUIS  5 MG TABS tablet TAKE 1 TABLET BY MOUTH TWICE A DAY 12/02/22   Aleck Hurdle, MD  fluticasone -salmeterol (WIXELA INHUB ) 250-50 MCG/ACT AEPB Inhale 1 puff  into the lungs in the morning and at bedtime. 05/01/23   Aleck Hurdle, MD  guaiFENesin  (MUCINEX ) 600 MG 12 hr tablet Take 1 tablet (600 mg total) by mouth 2 (two) times daily. 06/26/22   Lorita Rosa, MD  ondansetron  (ZOFRAN -ODT) 4 MG disintegrating tablet 4mg  ODT q4 hours prn nausea/vomit Patient taking differently: Take 4 mg by mouth every 8 (eight) hours as needed for nausea or vomiting. 4mg  ODT q4 hours prn nausea/vomit 12/19/21   Pollina, Marine Sia, MD  predniSONE  (DELTASONE ) 10 MG tablet 4 tabs for 2 days, then 3 tabs for 2 days, 2 tabs for 2 days, then 1 tab for 2 days, then  stop 01/23/23   Cobb, Mariah Shines, NP  sodium chloride  0.9 % nebulizer solution Take 3 mLs by nebulization 3 (three) times daily as needed for wheezing. 01/29/23   Desai, Nikita S, MD  Tiotropium Bromide  Monohydrate (SPIRIVA  RESPIMAT) 2.5 MCG/ACT AERS Inhale 2 puffs into the lungs daily. 01/29/23   Aleck Hurdle, MD    ___________________________________________________________________________________________________ Physical Exam:    05/29/2023    6:45 PM 05/29/2023    5:45 PM 05/29/2023    3:35 PM  Vitals with BMI  Systolic 148 136 578  Diastolic 77 79 133  Pulse 71 68 82     1. General:  in No  Acute distress   Chronically ill   -appearing 2. Psychological: Alert and   Oriented 3. Head/ENT:    Dry Mucous Membranes                          Head Non traumatic, neck supple                          Poor Dentition 4. SKIN:  decreased Skin turgor,  Skin clean Dry and intact no rash    5. Heart: Regular rate and rhythm no  Murmur, no Rub or gallop 6. Lungs:  some wheezes or crackles   7. Abdomen: Soft,  non-tender, Non distended bowel sounds present 8. Lower extremities: no clubbing, cyanosis, no  edema 9. Neurologically Grossly intact, moving all 4 extremities equally  10. MSK: Normal range of motion    Chart has been  reviewed  ______________________________________________________________________________________________  Assessment/Plan 73 y.o. 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  Admitted for   Respiratory  failure with hypoxia  COPD with acute exacerbation (HCC)     Present on Admission:  COPD exacerbation (HCC)  CKD-3A  Chest pain  Hypertension  History of pulmonary embolus (PE)  Asthma with COPD with exacerbation (HCC)  Acute respiratory failure with hypoxia (HCC)  AKI (acute kidney injury) (HCC)     CKD-3A  -chronic avoid nephrotoxic medications such as NSAIDs, Vanco Zosyn combo,  avoid hypotension, continue to follow renal function   History of bladder cancer s/p TURBT and Chemo in [place] Chronic stable  Presence of IVC filter Due to history of bilateral PEs  Chest pain Musculoskeletal in nature reproducible by palpation worse with coughing ordered Robaxin  for muscle spasms recommended splinting  incentive spirometer Flutter valve  Hypertension Continue Norvasc  10 mg a day  History of pulmonary embolus (PE) Continue Eliquis  for now  Asthma with COPD with exacerbation (HCC)  -  - Will initiate: Steroid taper  -  Antibiotics azithromycin  - Albuterol   PRN, - scheduled duoneb,  -  Breo or Dulera  at discharge   -  Mucinex .  Titrate O2 to saturation >90%. Follow patients respiratory status.  VBG no evidence of hypercarbia  Currently mentating well no evidence of symptomatic hypercarbia   Acute respiratory failure with hypoxia (HCC)  this patient has acute respiratory failure with Hypoxia  as documented by the presence of following: O2 saturatio< 90% on RA  Likely due to:  COPD exacerbation,   Provide O2 therapy and titrate as needed  Continuous pulse ox   check Pulse ox with ambulation prior to discharge   may need  TC consult  for home  O2 set up    flutter valve ordered   AKI (acute kidney injury) (HCC) AKI on chronic CKD.  Noticed somewhat worsening renal function will rehydrate   Other plan as per orders.  DVT prophylaxis:  elqiuis      as per patien  I had personally discussed CODE STATUS with patient and family  ACP   none    Family Communication:   Family  at  Bedside  plan of care was discussed   with  Wife   Diet  heart helthy   Disposition Plan:      To home once workup is complete and patient is stable   Following barriers for discharge:                                                         Pain controlled with PO medications                                                       Will likely need home health, home O2, set up        Consult Orders  (From admission, onward)           Start     Ordered   05/29/23 1824  Consult to hospitalist  Once       Provider:  (Not yet assigned)  Question Answer Comment  Place call to: [facility]   Reason for Consult Admit      05/29/23 1823                               Consults called: none, may benfit from AM pulmonology   Admission status:  ED Disposition     ED Disposition  Admit   Condition  --   Comment  Hospital Area[facility] [[id number]]  Level of Care: Progressive [102]  Admit to Progressive based on following criteria: RESPIRATORY PROBLEMS hypoxemic/hypercapnic respiratory failure that is responsive to NIPPV (BiPAP) or High Flow Nasal Cannula (6-80 lpm). Frequent assessment/intervention, no > Q2 hrs < Q4 hrs, to maintain oxygenation and pulmonary hygiene.  May admit patient to [facility] or [facility] if equivalent level of care is available:: No  Covid Evaluation: Asymptomatic - no recent exposure (last 10 days) testing not required  Diagnosis: COPD exacerbation [facility]) [[id number]]  Admitting Physician: Reya Aurich [[id number]]  Attending Physician: Twila Rappa [[id number]]   Certification:: I certify this patient will need inpatient services for at least 2 midnights  Expected Medical Readiness: 06/02/2023            inpatient     I Expect 2 midnight stay secondary to severity of patient's current illness need for inpatient interventions justified by the following:  hemodynamic instability despite optimal treatment (tachycardia  hypoxia  )   Severe lab/radiological/exam abnormalities including:      COPD with acute exacerbation (HCC)    and extensive comorbidities including:   COPD/asthma   That are currently affecting medical management.   I expect  patient to be hospitalized for 2 midnights requiring inpatient medical care.  Patient is at high risk for adverse outcome (such as loss of life or disability) if not treated.  Indication for inpatient stay as follows:   Hemodynamic instability despite maximal medical therapy,  severe pain requiring acute inpatient management,     persistent chest pain despite medical management   New or worsening hypoxia    Need for IV antibiotics, IV fluids, , IV pain medications,       Level of care           progressive           Romain Erion 05/30/2023, 12:22 AM    [facility]     after 2 AM please page floor coverage   If 7AM-7PM, please contact the day team taking care of the patient using [email or web address]

## 2023-05-29 NOTE — ED Provider Notes (Signed)
 [place] [facility] AT [facility] Provider Note  CSN: [id number] Arrival date & time: 05/29/23 1524  Chief Complaint(s) Respiratory Distress  HPI James Barker is a 73 y.o. male with past medical history as below, significant for asthma, COPD, CKD, emphysema, HCV, PE status post IVC filter, MAI infection who presents to the ED with complaint of dyspnea, cough  Patient here by EMS with difficulty breathing.  Reports ongoing symptoms for the past 24 hours.  Chills, body aches, productive cough with yellow sputum.  On EMS arrival patient was tripoding, respiratory distress, accessory muscle use.  Received nebulized breathing treatments, steroids with modest improvement of symptoms.  He does not use home oxygen.  Past Medical History Past Medical History:  Diagnosis Date   Anemia    Arthritis    Asthma-COPD overlap syndrome [facility])    pulmonology--- dr n desai;   last exacerbation 05-08-2021   Benign localized prostatic hyperplasia with lower urinary tract symptoms (LUTS)    Chronic cough    due to copd   Chronic kidney disease    Dairy product intolerance    DOE (dyspnea on exertion)    d/t asthma/ COPD   Dyspnea    Emphysema, unspecified (HCC)    Full dentures    History of bladder cancer 2020   s/p TURBT w/ chemo instillation   History of COVID-19    per pt 2021 (no at same time of pneumonia)  w/ moderate symptoms , recovered at home, that resolved back to baseline   History of hepatitis C 2018   hx chronic hepatitis C,  per pt treated and told was cured   History of latent tuberculosis 2017   pt tested positive for TB exposure, not active, treated w/ INH for 9 months   History of MAI infection 2021   never treated   History of pulmonary embolism    (05-09-2021  pt stated never had clot prior to 2017 , none since 2020, had negative blood disorder work-up)//  2017  treated w/ xarelto 6 months//  and recurrent 04/ 2020 in setting severe UTI and  deconditioning w/ prolonged admission due to intolerant to anticoagulants IVC filter inserted   Hypertension    OSA (obstructive sleep apnea)    per pt dx yrs ago, intolerant to cpap   Pneumonia    Pulmonary eosinophilia (HCC)    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   Weak urinary stream    Wears glasses    Patient Active Problem List   Diagnosis Date Noted   Encounter for immunization 01/31/2023   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, unspecified 01/31/2023   Need for influenza vaccination 01/28/2023   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   Encounter for screening for HIV 09/09/2022   Chronic night sweats 09/09/2022   Acute bronchitis due to Rhinovirus 06/26/2022   COPD exacerbation (HCC) 06/24/2022   Bladder cancer (HCC) 06/06/2021   Insomnia 05/17/2021   CKD-3A 05/17/2021   Gross hematuria 05/16/2021   Bilateral pulmonary embolism (HCC) 05/15/2021   History of bladder cancer s/p TURBT and Chemo in [place] 05/15/2021   Presence of IVC filter 05/15/2021   BPH (benign prostatic hyperplasia) 05/15/2021   Chest pain 05/15/2021   Asthma with COPD with exacerbation (HCC) 04/13/2021   Eosinophilia 04/13/2021   Hypertension 04/13/2021   Allergic Asthma COPD Overlap Syndrome 01/02/2021   Home Medication(s) Prior to Admission medications   Medication Sig Start Date End Date Taking? Authorizing Provider  albuterol  (PROVENTIL ) (2.5  MG/3ML) 0.083% nebulizer solution Take 3 mLs (2.5 mg total) by nebulization every 6 (six) hours as needed for wheezing or shortness of breath. 04/26/21   Cobb, Mariah Shines, NP  albuterol  (VENTOLIN  HFA) 108 (90 Base) MCG/ACT inhaler TAKE 2 PUFFS BY MOUTH EVERY 6 HOURS AS NEEDED FOR WHEEZE OR SHORTNESS OF BREATH 09/19/22   Aleck Hurdle, MD  amLODipine  (NORVASC ) 10 MG tablet Take 1 tablet (10 mg total) by mouth daily. 05/21/21   Gonfa, Taye T, MD  atorvastatin  (LIPITOR) 20 MG tablet Take 20 mg by mouth daily. 12/07/20   [provider]  budesonide   (PULMICORT ) 0.25 MG/2ML nebulizer solution Use 2 mLs (0.25 mg) by nebulization 2 times daily. 06/26/22   Lorita Rosa, MD  Dupilumab  (DUPIXENT ) 300 MG/2ML SOAJ Inject 300 mg into the skin every 14 days. 02/18/23   Aleck Hurdle, MD  ELIQUIS  5 MG TABS tablet TAKE 1 TABLET BY MOUTH TWICE A DAY 12/02/22   Aleck Hurdle, MD  fluticasone -salmeterol (WIXELA INHUB ) 250-50 MCG/ACT AEPB Inhale 1 puff into the lungs in the morning and at bedtime. 05/01/23   Desai, Nikita S, MD  guaiFENesin  (MUCINEX ) 600 MG 12 hr tablet Take 1 tablet (600 mg total) by mouth 2 (two) times daily. 06/26/22   Lorita Rosa, MD  ondansetron  (ZOFRAN -ODT) 4 MG disintegrating tablet 4mg  ODT q4 hours prn nausea/vomit Patient taking differently: Take 4 mg by mouth every 8 (eight) hours as needed for nausea or vomiting. 4mg  ODT q4 hours prn nausea/vomit 12/19/21   Pollina, Marine Sia, MD  predniSONE  (DELTASONE ) 10 MG tablet 4 tabs for 2 days, then 3 tabs for 2 days, 2 tabs for 2 days, then 1 tab for 2 days, then stop 01/23/23   Cobb, Mariah Shines, NP  sodium chloride  0.9 % nebulizer solution Take 3 mLs by nebulization 3 (three) times daily as needed for wheezing. 01/29/23   Desai, Nikita S, MD  Tiotropium Bromide  Monohydrate (SPIRIVA  RESPIMAT) 2.5 MCG/ACT AERS Inhale 2 puffs into the lungs daily. 01/29/23   Aleck Hurdle, MD                                                                                                                                    Past Surgical History Past Surgical History:  Procedure Laterality Date   ANAL FISTULECTOMY     x5  last one 2008   BRONCHOSCOPY  2021   per pt had pneumonia   (done in [place] )   INGUINAL HERNIA REPAIR Right 2010   IVC FILTER INSERTION  2020   in [place]   ORIF WRIST FRACTURE Left 2016   SHOULDER ARTHROSCOPY W/ ROTATOR CUFF REPAIR Left 2016   SHOULDER ARTHROSCOPY WITH ROTATOR CUFF REPAIR AND SUBACROMIAL DECOMPRESSION Left 05/09/2022   Procedure: SHOULDER ARTHROSCOPY WITH ROTATOR CUFF  REPAIR AND SUBACROMIAL DECOMPRESSION;  Surgeon: Sammye Cristal, MD;  Location: [facility];  Service: Orthopedics;  Laterality: Left;  TRANSURETHRAL RESECTION OF BLADDER TUMOR  2020   in [place]   TRANSURETHRAL RESECTION OF PROSTATE  2021   in [place]   TRANSURETHRAL RESECTION OF PROSTATE N/A 05/11/2021   Procedure: PROSTATE EXAM UNDER ANESTHESIA; TRANSURETHRAL RESECTION OF THE PROSTATE (TURP), BLADDER BIOPSY;  Surgeon: Christina Coyer, MD;  Location: [facility];  Service: Urology;  Laterality: N/A;   UMBILICAL HERNIA REPAIR  2011   Family History Family History  Problem Relation Age of Onset   Pulmonary embolism Mother    Emphysema Father    Lung cancer Sister    Asthma Child    Asthma Child     Social History Social History   Tobacco Use   Smoking status: Former    Current packs/day: 0.00    Average packs/day: 1 pack/day for 20.0 years (20.0 ttl pk-yrs)    Types: Cigarettes    Start date: 34    Quit date: 70    Years since quitting: 33.3   Smokeless tobacco: Never  Vaping Use   Vaping status: Never Used  Substance Use Topics   Alcohol use: Not Currently   Drug use: Yes    Types: Marijuana    Comment: 05-09-2021  per pt smokes occasional   Allergies Cheese and Lactose intolerance (gi)  Review of Systems A thorough review of systems was obtained and all systems are negative except as noted in the HPI and PMH.   Physical Exam Vital Signs  I have reviewed the triage vital signs BP (!) 151/133 (BP Location: Left Arm)   Pulse 82   Temp 98.2 F (36.8 C) (Axillary)   Resp (!) 23   SpO2 98%  Physical Exam Vitals and nursing note reviewed.  Constitutional:      General: He is in acute distress.     Appearance: He is well-developed. He is ill-appearing.  HENT:     Head: Normocephalic and atraumatic.     Right Ear: External ear normal.     Left Ear: External ear normal.     Mouth/Throat:     Mouth: Mucous membranes are moist.  Eyes:     General: No  scleral icterus. Cardiovascular:     Rate and Rhythm: Regular rhythm. Tachycardia present.     Pulses: Normal pulses.     Heart sounds: Normal heart sounds.  Pulmonary:     Effort: Tachypnea and respiratory distress present.     Breath sounds: Wheezing present.  Abdominal:     General: Abdomen is flat.     Palpations: Abdomen is soft.     Tenderness: There is no abdominal tenderness.  Musculoskeletal:     Cervical back: No rigidity.     Right lower leg: No edema.     Left lower leg: No edema.  Skin:    General: Skin is warm and dry.     Capillary Refill: Capillary refill takes less than 2 seconds.  Neurological:     Mental Status: He is alert.  Psychiatric:        Mood and Affect: Mood normal.        Behavior: Behavior normal.     ED Results and Treatments Labs (all labs ordered are listed, but only abnormal results are displayed) Labs Reviewed  RESP PANEL BY RT-PCR (RSV, FLU A&B, COVID)  RVPGX2  BASIC METABOLIC PANEL WITH GFR  BLOOD GAS, VENOUS  CBC WITH DIFFERENTIAL/PLATELET  BRAIN NATRIURETIC PEPTIDE  D-DIMER, QUANTITATIVE  TROPONIN I (HIGH SENSITIVITY)  Radiology DG Chest Portable 1 View Result Date: 05/29/2023 CLINICAL DATA:  Shortness of breath. EXAM: PORTABLE CHEST 1 VIEW COMPARISON:  06/24/2022. FINDINGS: Low lung volumes. The heart size and mediastinal contours are within normal limits. Mild bibasilar atelectasis. No focal consolidation, sizeable pleural effusion, or pneumothorax. Remote right-sided rib fracture. No acute osseous abnormality. IMPRESSION: Low lung volumes with mild bibasilar atelectasis. Electronically Signed   By: Mannie Seek M.D.   On: 05/29/2023 15:57    Pertinent labs & imaging results that were available during my care of the patient were reviewed by me and considered in my medical decision making (see MDM for  details).  Medications Ordered in ED Medications  magnesium  sulfate IVPB 2 g 50 mL (2 g Intravenous New Bag/Given 05/29/23 1617)  ipratropium-albuterol  (DUONEB) 0.5-2.5 (3) MG/3ML nebulizer solution 3 mL (3 mLs Nebulization Given 05/29/23 1617)  sodium chloride  0.9 % bolus 1,000 mL (1,000 mLs Intravenous New Bag/Given 05/29/23 1616)                                                                                                                                     Procedures .Critical Care  Performed by: Teddi Favors, DO Authorized by: Teddi Favors, DO   Critical care provider statement:    Critical care time (minutes):  40   Critical care time was exclusive of:  Separately billable procedures and treating other patients   Critical care was necessary to treat or prevent imminent or life-threatening deterioration of the following conditions:  Respiratory failure   Critical care was time spent personally by me on the following activities:  Development of treatment plan with patient or surrogate, discussions with consultants, evaluation of patient's response to treatment, examination of patient, ordering and review of laboratory studies, ordering and review of radiographic studies, ordering and performing treatments and interventions, pulse oximetry, re-evaluation of patient's condition, review of old charts and obtaining history from patient or surrogate   (including critical care time)  Medical Decision Making / ED Course    Medical Decision Making:    GIANLUCAS GOVIN is a 73 y.o. male with past medical history as below, significant for asthma, COPD, CKD, emphysema, HCV, PE status post IVC filter, MAI infection who presents to the ED with complaint of dyspnea, cough. The complaint involves an extensive differential diagnosis and also carries with it a high risk of complications and morbidity.  Serious etiology was considered. Ddx includes but is not limited to: In my evaluation of this  patient's dyspnea my DDx includes, but is not limited to, pneumonia, pulmonary embolism, pneumothorax, pulmonary edema, metabolic acidosis, asthma, COPD, cardiac cause, anemia, anxiety, etc.    Complete initial physical exam performed, notably the patient was in respiratory distress, wheezing noted diffusely, retractions noted.    Reviewed and confirmed nursing documentation for past medical history, family history, social history.  Vital signs reviewed.     Brief  summary:  73 year old male history as above including COPD, eczema, asthma, MAI infection here with cough, respiratory distress.  Clinical Course as of 05/29/23 1659  Thu May 29, 2023  1641 Breathing improving, will trial [facility] and see how he tolerates [SG]    Clinical Course User Index [SG] Teddi Favors, DO    Feeling somewhat better, labs pending. XR stable. Continue nasal cannula, now on 5L, does not use at home. Handoff to incoming EDP, will likely need to be admitted                Additional history obtained: -Additional history obtained from family -External records from outside source obtained and reviewed including: Chart review including previous notes, labs, imaging, consultation notes including  Primary care documentation Home meds   Lab Tests: -I ordered, reviewed, and interpreted labs.   The pertinent results include:   Labs Reviewed  RESP PANEL BY RT-PCR (RSV, FLU A&B, COVID)  RVPGX2  BASIC METABOLIC PANEL WITH GFR  BLOOD GAS, VENOUS  CBC WITH DIFFERENTIAL/PLATELET  BRAIN NATRIURETIC PEPTIDE  D-DIMER, QUANTITATIVE  TROPONIN I (HIGH SENSITIVITY)    Pending labs  EKG   EKG Interpretation Date/Time:  Thursday May 29 2023 15:34:50 EDT Ventricular Rate:  93 PR Interval:    QRS Duration:  97 QT Interval:  367 QTC Calculation: 457 R Axis:   66  Text Interpretation: Borderline ST depression, diffuse leads similar to prior Interpretation limited secondary to artifact Confirmed by  Russella Courts ([id number]) on 05/29/2023 4:43:28 PM         Imaging Studies ordered: I ordered imaging studies including cxr I independently visualized the following imaging with scope of interpretation limited to determining acute life threatening conditions related to emergency care; findings noted above I agree with the radiologist interpretation If any imaging was obtained with contrast I closely monitored patient for any possible adverse reaction a/w contrast administration in the emergency department   Medicines ordered and prescription drug management: Meds ordered this encounter  Medications   magnesium  sulfate IVPB 2 g 50 mL   ipratropium-albuterol  (DUONEB) 0.5-2.5 (3) MG/3ML nebulizer solution 3 mL   sodium chloride  0.9 % bolus 1,000 mL    -I have reviewed the patients home medicines and have made adjustments as needed   Consultations Obtained: na   Cardiac Monitoring: The patient was maintained on a cardiac monitor.  I personally viewed and interpreted the cardiac monitored which showed an underlying rhythm of: nsr Continuous pulse oximetry interpreted by myself, 94% on 5L.    Social Determinants of Health:  Diagnosis or treatment significantly limited by social determinants of health: former smoker   Reevaluation: After the interventions noted above, I reevaluated the patient and found that they have improved  Co morbidities that complicate the patient evaluation  Past Medical History:  Diagnosis Date   Anemia    Arthritis    Asthma-COPD overlap syndrome [facility])    pulmonology--- dr n desai;   last exacerbation 05-08-2021   Benign localized prostatic hyperplasia with lower urinary tract symptoms (LUTS)    Chronic cough    due to copd   Chronic kidney disease    Dairy product intolerance    DOE (dyspnea on exertion)    d/t asthma/ COPD   Dyspnea    Emphysema, unspecified (HCC)    Full dentures    History of bladder cancer 2020   s/p TURBT w/ chemo  instillation   History of COVID-19    per pt 2021 (no  at same time of pneumonia)  w/ moderate symptoms , recovered at home, that resolved back to baseline   History of hepatitis C 2018   hx chronic hepatitis C,  per pt treated and told was cured   History of latent tuberculosis 2017   pt tested positive for TB exposure, not active, treated w/ INH for 9 months   History of MAI infection 2021   never treated   History of pulmonary embolism    (05-09-2021  pt stated never had clot prior to 2017 , none since 2020, had negative blood disorder work-up)//  2017  treated w/ xarelto 6 months//  and recurrent 04/ 2020 in setting severe UTI and deconditioning w/ prolonged admission due to intolerant to anticoagulants IVC filter inserted   Hypertension    OSA (obstructive sleep apnea)    per pt dx yrs ago, intolerant to cpap   Pneumonia    Pulmonary eosinophilia (HCC)    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   Weak urinary stream    Wears glasses       Dispostion: Disposition decision including need for hospitalization was considered, and patient disposition pending at time of sign out.    Final Clinical Impression(s) / ED Diagnoses Final diagnoses:  Respiratory distress  COPD with acute exacerbation (HCC)        Teddi Favors, DO 05/29/23 1659

## 2023-05-29 NOTE — Subjective & Objective (Signed)
 Presents with respiratory shortness of breath body aches and chills feeling tight in the chest increase work of breathing with retractions patient at baseline not on oxygen EMS started on 7 L Known history of COPD history of PE status post IVC filter and MAI infection

## 2023-05-29 NOTE — Assessment & Plan Note (Signed)
-  -   Will initiate: Steroid taper  -  Antibiotics azithromycin  - Albuterol   PRN, - scheduled duoneb,  -  Breo or Dulera  at discharge   -  Mucinex .  Titrate O2 to saturation >90%. Follow patients respiratory status.  VBG no evidence of hypercarbia  Currently mentating well no evidence of symptomatic hypercarbia

## 2023-05-29 NOTE — Assessment & Plan Note (Signed)
-  chronic avoid nephrotoxic medications such as NSAIDs, Vanco Zosyn combo,  avoid hypotension, continue to follow renal function

## 2023-05-29 NOTE — Assessment & Plan Note (Signed)
Continue Eliquis for now

## 2023-05-29 NOTE — ED Triage Notes (Signed)
 PT BIBA from home for respiratory distress and shortness of breath, aches, chills and tightness in the chest.    Pt has labored breathing and retractions.  Currently on 7L mask/duoneb.

## 2023-05-29 NOTE — Assessment & Plan Note (Signed)
 Musculoskeletal in nature reproducible by palpation worse with coughing ordered Robaxin for muscle spasms recommended splinting  incentive spirometer Flutter valve

## 2023-05-29 NOTE — Assessment & Plan Note (Signed)
this patient has acute respiratory failure with Hypoxia   as documented by the presence of following: O2 saturatio< 90% on RA   Likely due to:  COPD exacerbation,  Provide O2 therapy and titrate as needed  Continuous pulse ox  check Pulse ox with ambulation prior to discharge   may need  TC consult for home O2 set up  flutter valve ordered  

## 2023-05-29 NOTE — Assessment & Plan Note (Signed)
 Chronic-stable.

## 2023-05-29 NOTE — Assessment & Plan Note (Signed)
 Continue Norvasc 10 mg a day

## 2023-05-29 NOTE — Assessment & Plan Note (Signed)
 Due to history of bilateral PEs

## 2023-05-30 ENCOUNTER — Inpatient Hospital Stay (HOSPITAL_COMMUNITY)

## 2023-05-30 DIAGNOSIS — I82220 Acute embolism and thrombosis of inferior vena cava: Secondary | ICD-10-CM | POA: Diagnosis not present

## 2023-05-30 DIAGNOSIS — J441 Chronic obstructive pulmonary disease with (acute) exacerbation: Secondary | ICD-10-CM | POA: Diagnosis not present

## 2023-05-30 DIAGNOSIS — Z95828 Presence of other vascular implants and grafts: Secondary | ICD-10-CM | POA: Diagnosis not present

## 2023-05-30 DIAGNOSIS — Z86711 Personal history of pulmonary embolism: Secondary | ICD-10-CM | POA: Diagnosis not present

## 2023-05-30 DIAGNOSIS — Z8551 Personal history of malignant neoplasm of bladder: Secondary | ICD-10-CM | POA: Diagnosis not present

## 2023-05-30 DIAGNOSIS — J432 Centrilobular emphysema: Secondary | ICD-10-CM | POA: Diagnosis not present

## 2023-05-30 DIAGNOSIS — N1831 Chronic kidney disease, stage 3a: Secondary | ICD-10-CM | POA: Diagnosis not present

## 2023-05-30 DIAGNOSIS — C679 Malignant neoplasm of bladder, unspecified: Secondary | ICD-10-CM | POA: Diagnosis not present

## 2023-05-30 DIAGNOSIS — N179 Acute kidney failure, unspecified: Secondary | ICD-10-CM | POA: Diagnosis present

## 2023-05-30 LAB — COMPREHENSIVE METABOLIC PANEL WITH GFR
ALT: 13 U/L (ref 0–44)
AST: 23 U/L (ref 15–41)
Albumin: 3.2 g/dL — ABNORMAL LOW (ref 3.5–5.0)
Alkaline Phosphatase: 47 U/L (ref 38–126)
Anion gap: 11 (ref 5–15)
BUN: 26 mg/dL — ABNORMAL HIGH (ref 8–23)
CO2: 17 mmol/L — ABNORMAL LOW (ref 22–32)
Calcium: 8.6 mg/dL — ABNORMAL LOW (ref 8.9–10.3)
Chloride: 111 mmol/L (ref 98–111)
Creatinine, Ser: 1.01 mg/dL (ref 0.61–1.24)
GFR, Estimated: >60 mL/min (ref >60)
Glucose, Bld: 145 mg/dL — ABNORMAL HIGH (ref 70–99)
Potassium: 4.4 mmol/L (ref 3.5–5.1)
Sodium: 139 mmol/L (ref 135–145)
Total Bilirubin: 0.7 mg/dL (ref 0.0–1.2)
Total Protein: 6.1 g/dL — ABNORMAL LOW (ref 6.5–8.1)

## 2023-05-30 LAB — MAGNESIUM: Magnesium: 2.3 mg/dL (ref 1.7–2.4)

## 2023-05-30 LAB — IRON AND TIBC
Iron: 36 ug/dL — ABNORMAL LOW (ref 45–182)
Saturation Ratios: 10 % — ABNORMAL LOW (ref 17.9–39.5)
TIBC: 354 ug/dL (ref 250–450)
UIBC: 318 ug/dL

## 2023-05-30 LAB — RESPIRATORY PANEL BY PCR

## 2023-05-30 LAB — CBC
HCT: 33.9 % — ABNORMAL LOW (ref 39.0–52.0)
Hemoglobin: 11 g/dL — ABNORMAL LOW (ref 13.0–17.0)
MCH: 29.3 pg (ref 26.0–34.0)
MCHC: 32.4 g/dL (ref 30.0–36.0)
MCV: 90.4 fL (ref 80.0–100.0)
Platelets: 151 10*3/uL (ref 150–400)
RBC: 3.75 MIL/uL — ABNORMAL LOW (ref 4.22–5.81)
RDW: 13.6 % (ref 11.5–15.5)
WBC: 6.4 10*3/uL (ref 4.0–10.5)
nRBC: 0 % (ref 0.0–0.2)

## 2023-05-30 LAB — PHOSPHORUS: Phosphorus: 2.6 mg/dL (ref 2.5–4.6)

## 2023-05-30 LAB — OSMOLALITY, URINE: Osmolality, Ur: 461 mosm/kg (ref 300–900)

## 2023-05-30 LAB — FERRITIN: Ferritin: 35 ng/mL (ref 24–336)

## 2023-05-30 LAB — VITAMIN B12: Vitamin B-12: 148 pg/mL — ABNORMAL LOW (ref 180–914)

## 2023-05-30 LAB — LACTIC ACID, PLASMA
Lactic Acid, Venous: 5.3 mmol/L (ref 0.5–1.9)
Lactic Acid, Venous: <0.3 mmol/L — ABNORMAL LOW (ref 0.5–1.9)

## 2023-05-30 LAB — CREATININE, URINE, RANDOM: Creatinine, Urine: 65 mg/dL

## 2023-05-30 LAB — SODIUM, URINE, RANDOM: Sodium, Ur: 82 mmol/L

## 2023-05-30 LAB — FOLATE: Folate: 10.2 ng/mL (ref >5.9)

## 2023-05-30 LAB — OSMOLALITY: Osmolality: 304 mosm/kg — ABNORMAL HIGH (ref 275–295)

## 2023-05-30 MED ORDER — AZITHROMYCIN 250 MG PO TABS: 500.0000 mg | ORAL_TABLET | Freq: Every day | ORAL | Status: DC

## 2023-05-30 MED ORDER — HYDROCODONE-ACETAMINOPHEN 5-325 MG PO TABS: 1.0000 | ORAL_TABLET | Freq: Four times a day (QID) | ORAL | 0 refills | Status: DC | PRN

## 2023-05-30 MED ORDER — BENZONATATE 100 MG PO CAPS: 100.0000 mg | ORAL_CAPSULE | Freq: Three times a day (TID) | ORAL | Status: DC | PRN

## 2023-05-30 MED ORDER — SODIUM CHLORIDE (PF) 0.9 % IJ SOLN
INTRAMUSCULAR | Status: AC
  Filled 2023-05-30: qty 50

## 2023-05-30 MED ORDER — BENZONATATE 100 MG PO CAPS: 100.0000 mg | ORAL_CAPSULE | Freq: Three times a day (TID) | ORAL | 0 refills | Status: AC | PRN

## 2023-05-30 MED ORDER — IOHEXOL 300 MG/ML  SOLN
75.0000 mL | Freq: Once | INTRAMUSCULAR | Status: AC | PRN
  Administered 2023-05-30: 75 mL via INTRAVENOUS

## 2023-05-30 MED ORDER — PANTOPRAZOLE SODIUM 40 MG PO TBEC: 40.0000 mg | DELAYED_RELEASE_TABLET | Freq: Every day | ORAL | 2 refills | Status: AC

## 2023-05-30 MED ORDER — GUAIFENESIN ER 600 MG PO TB12: 600.0000 mg | ORAL_TABLET | Freq: Two times a day (BID) | ORAL | 0 refills | Status: AC | PRN

## 2023-05-30 MED ORDER — PANTOPRAZOLE SODIUM 40 MG PO TBEC
40.0000 mg | DELAYED_RELEASE_TABLET | Freq: Every day | ORAL | Status: DC
  Administered 2023-05-30: 40 mg via ORAL
  Filled 2023-05-30: qty 1

## 2023-05-30 MED ORDER — PREDNISONE 10 MG PO TABS: ORAL_TABLET | ORAL | 0 refills | Status: DC

## 2023-05-30 MED ORDER — AMLODIPINE BESYLATE 5 MG PO TABS: 5.0000 mg | ORAL_TABLET | Freq: Every day | ORAL | 1 refills | Status: AC

## 2023-05-30 MED ORDER — AZITHROMYCIN 500 MG PO TABS: ORAL_TABLET | ORAL | 0 refills | Status: DC

## 2023-05-30 MED ORDER — IPRATROPIUM-ALBUTEROL 0.5-2.5 (3) MG/3ML IN SOLN
3.0000 mL | Freq: Three times a day (TID) | RESPIRATORY_TRACT | Status: DC
  Administered 2023-05-30: 3 mL via RESPIRATORY_TRACT
  Filled 2023-05-30: qty 3

## 2023-05-30 MED ORDER — SENNOSIDES-DOCUSATE SODIUM 8.6-50 MG PO TABS
1.0000 | ORAL_TABLET | Freq: Two times a day (BID) | ORAL | Status: DC
  Administered 2023-05-30: 1 via ORAL
  Filled 2023-05-30: qty 1

## 2023-05-30 NOTE — Discharge Summary (Signed)
 Physician Discharge Summary   Patient: James Barker MRN: [id number] DOB: 10/20/1950  Admit date:     05/29/2023  Discharge date: 05/30/23  Discharge Physician: Ozell Blunt   PCP: Sinda Duel, PA   Recommendations at discharge:   Follow-up PCP in 1 week  Discharge Diagnoses: Principal Problem:   COPD exacerbation (HCC) Active Problems:   History of pulmonary embolus (PE)   History of bladder cancer s/p TURBT and Chemo in [place]   CKD-3A   Presence of IVC filter   Chest pain   Hypertension   Asthma with COPD with exacerbation (HCC)   Acute respiratory failure with hypoxia (HCC)   AKI (acute kidney injury) (HCC)  Resolved Problems:   * No resolved hospital problems. *  Hospital Course: 73 y.o. 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 -Complains of chest pain while coughing -Will discharge on Tessalon Perles as needed, prednisone  taper for 5 more days, Vicodin 1 tablet every 6 hours as needed.  GERD - CT chest showed dysmotility of esophagus/GERD - Start Protonix 40 mg p.o. daily  Musculoskeletal chest pain - Complained of chest pain while coughing - Will give Vicodin  as needed for pain, Tessalon Perles for cough.  CKD-3A - Renal function  stable, at baseline     History of bladder cancer s/p TURBT and Chemo in [place] Chronic stable   Presence of IVC filter Due to history of bilateral PEs    Hypertension Continue Norvasc  5 mg daily and lisinopril 10 mg daily   History of pulmonary embolus (PE) Continue Eliquis            Consultants:  Procedures performed:  Disposition: Home Diet recommendation:  Discharge Diet Orders (From admission, onward)     Start     Ordered   05/30/23 0000  Diet - low sodium heart healthy        05/30/23 1222           Regular diet DISCHARGE MEDICATION: Allergies as of 05/30/2023       Reactions   Cheese Nausea And Vomiting   Lactose Intolerance (gi) Diarrhea, Nausea And Vomiting        Medication List     STOP taking these medications    oxyCODONE  5 MG immediate release tablet Commonly known as: Oxy IR/ROXICODONE    sodium chloride  0.9 % nebulizer solution       TAKE these medications    albuterol  (2.5 MG/3ML) 0.083% nebulizer solution Commonly known as: PROVENTIL  Take 3 mLs (2.5 mg total) by nebulization every 6 (six) hours as needed for wheezing or shortness of breath.   albuterol  108 (90 Base) MCG/ACT inhaler Commonly known as: VENTOLIN  HFA TAKE 2 PUFFS BY MOUTH EVERY 6 HOURS AS NEEDED  FOR WHEEZE OR SHORTNESS OF BREATH   amLODipine  5 MG tablet Commonly known as: NORVASC  Take 1 tablet (5 mg total) by mouth daily.   atorvastatin  20 MG tablet Commonly known as: LIPITOR Take 20 mg by mouth daily.   azithromycin  500 MG tablet Commonly known as: ZITHROMAX  Take 500 mg daily x 3 days   benzonatate 100 MG capsule Commonly known as: TESSALON Take 1 capsule (100 mg total) by mouth 3 (three) times daily as needed for cough.   Dupixent  300 MG/2ML Soaj Generic drug: Dupilumab  Inject 300 mg into the skin every 14 days.   Eliquis  5 MG Tabs tablet Generic drug: apixaban  TAKE 1 TABLET BY MOUTH TWICE A DAY   fluticasone -salmeterol 250-50 MCG/ACT  Aepb Commonly known as: Wixela Inhub  Inhale 1 puff into the lungs in the morning and at bedtime.   guaiFENesin  600 MG 12 hr tablet Commonly known as: MUCINEX  Take 1 tablet (600 mg total) by mouth 2 (two) times daily as needed for up to 5 days for to loosen phlegm.   HYDROcodone -acetaminophen  5-325 MG tablet Commonly known as: NORCO/VICODIN Take 1 tablet by mouth every 6 (six) hours as needed for moderate pain (pain score 4-6).   lisinopril 10 MG tablet Commonly known as: ZESTRIL Take 10 mg by mouth daily.   pantoprazole 40 MG tablet Commonly known as: PROTONIX Take 1 tablet (40 mg total) by mouth daily at 12 noon. Start taking on: May 31, 2023   predniSONE  10 MG tablet Commonly known as: DELTASONE  Prednisone  40 mg po daily x 1 day then Prednisone  30 mg po daily x 1 day then Prednisone  20 mg po daily x 1 day then Prednisone  10 mg daily x 1 day then stop...   Spiriva  Respimat 2.5 MCG/ACT Aers Generic drug: Tiotropium Bromide  Monohydrate Inhale 2 puffs into the lungs daily.        Discharge Exam: Filed Weights   05/29/23 2118  Weight: 79.8 kg   General-appears in no acute distress Heart-S1-S2, regular, no murmur auscultated Lungs-clear to auscultation bilaterally, no wheezing or crackles auscultated Abdomen-soft, nontender, no organomegaly Extremities-no edema in the lower extremities Neuro-alert, oriented x3, no focal deficit noted  Condition at discharge: good  The results of significant diagnostics from this hospitalization (including imaging, microbiology, ancillary and laboratory) are listed below for reference.   Imaging Studies: CT CHEST W CONTRAST Result Date: 05/30/2023 CLINICAL DATA:  COPD exacerbation. Respiratory distress. Pulmonary embolism with IVC filter. Bladder cancer. Hepatitis C. EXAM: CT CHEST WITH CONTRAST TECHNIQUE: Multidetector CT imaging of the chest was performed during intravenous contrast administration. RADIATION DOSE REDUCTION: This exam  was performed according to the departmental dose-optimization program which includes automated exposure control, adjustment of the mA and/or kV according to patient size and/or use of iterative reconstruction technique. CONTRAST:  75mL OMNIPAQUE  IOHEXOL  300 MG/ML  SOLN COMPARISON:  Plain film of 1 day prior. Most recent CT of 06/24/2022 FINDINGS: Cardiovascular: Aortic atherosclerosis. Tortuous thoracic aorta. Normal heart size, without pericardial effusion. Lad coronary artery calcification. No central pulmonary embolism, on this non-dedicated study. Pulmonary artery enlargement, including a right-sided pulmonary artery of 2.9 cm. Mediastinum/Nodes: No mediastinal or hilar adenopathy. Small hiatal hernia. The esophagus is mildly dilated and fluid-filled throughout. Lungs/Pleura: No pleural fluid. Bibasilar scarring. Right-sided Bochdalek's hernia contains fat and is diminutive. Azygous fissure. Mild centrilobular emphysema. 3 mm nodule on the right minor fissure is similar and can be presumed benign. No follow-up indicated. Upper Abdomen: Normal imaged portions of the liver, spleen, pancreas, adrenal  glands, gallbladder, left kidney. Incompletely imaged right renal 2.0 cm lesion is likely a cyst . In the absence of clinically indicated signs/symptoms require(s) no independent follow-up. Incompletely imaged IVC filter. Musculoskeletal:   No acute osseous abnormality. IMPRESSION: 1. No acute process in the chest. 2. Esophageal air fluid level suggests dysmotility or gastroesophageal reflux. 3. Pulmonary artery enlargement suggests pulmonary arterial hypertension. 4. Aortic Atherosclerosis (ICD10-I70.0) and Emphysema (ICD10-J43.9). Coronary artery atherosclerosis. Electronically Signed   By: Lore Rode M.D.   On: 05/30/2023 11:00   DG Chest Portable 1 View Result Date: 05/29/2023 CLINICAL DATA:  Follow up pneumothorax. EXAM: PORTABLE CHEST 1 VIEW COMPARISON:  X-ray 05/29/2023 earlier. FINDINGS: Azygous fissure. No  definite pneumothorax or effusion. Stable cardiopericardial silhouette. There is some increasing atelectasis at the right midlung and left lung base. No consolidation or edema. Films are under penetrated. Degenerative changes of the spine. Overlapping cardiac leads. There is some chronic change of the distal left clavicle. IMPRESSION: No clear pneumothorax seen on the current x-ray. Increasing atelectasis. Electronically Signed   By: Adrianna Horde M.D.   On: 05/29/2023 19:16   DG Chest Portable 1 View Result Date: 05/29/2023 CLINICAL DATA:  Shortness of breath. EXAM: PORTABLE CHEST 1 VIEW COMPARISON:  06/24/2022. FINDINGS: Low lung volumes. The heart size and mediastinal contours are within normal limits. Mild bibasilar atelectasis. No focal consolidation, sizeable pleural effusion, or pneumothorax. Remote right-sided rib fracture. No acute osseous abnormality. IMPRESSION: Low lung volumes with mild bibasilar atelectasis. Electronically Signed   By: Mannie Seek M.D.   On: 05/29/2023 15:57    Microbiology: Results for orders placed or performed during the hospital encounter of 05/29/23  Resp panel by RT-PCR (RSV, Flu A&B, Covid) Anterior Nasal Swab     Status: None   Collection Time: 05/29/23  4:21 PM   Specimen: Anterior Nasal Swab  Result Value Ref Range Status   SARS Coronavirus 2 by RT PCR NEGATIVE NEGATIVE Final    Comment: (NOTE) SARS-CoV-2 target nucleic acids are NOT DETECTED.  The SARS-CoV-2 RNA is generally detectable in upper respiratory specimens during the acute phase of infection. The lowest concentration of SARS-CoV-2 viral copies this assay can detect is 138 copies/mL. A negative result does not preclude SARS-Cov-2 infection and should not be used as the sole basis for treatment or other patient management decisions. A negative result may occur with  improper specimen collection/handling, submission of specimen other than nasopharyngeal swab, presence of viral mutation(s)  within the areas targeted by this assay, and inadequate number of viral copies(<138 copies/mL). A negative result must be combined with clinical observations, patient history, and epidemiological information. The expected result is Negative.  Fact Sheet for Patients:  BloggerCourse.com  Fact Sheet for Healthcare Providers:  SeriousBroker.it  This test is no t yet approved or cleared by the [place]  FDA and  has been authorized for detection and/or diagnosis of SARS-CoV-2 by FDA under an Emergency Use Authorization (EUA). This EUA will remain  in effect (meaning this test can be used) for the duration of the COVID-19 declaration under Section 564(b)(1) of the Act, 21 U.S.C.section 360bbb-3(b)(1), unless the authorization is terminated  or revoked sooner.       Influenza A by PCR NEGATIVE NEGATIVE Final   Influenza B by PCR NEGATIVE NEGATIVE Final    Comment: (NOTE) The Xpert Xpress SARS-CoV-2/FLU/RSV plus assay is intended as an aid in the diagnosis of influenza from Nasopharyngeal swab specimens and should not be used as a sole basis for  treatment. Nasal washings and aspirates are unacceptable for Xpert Xpress SARS-CoV-2/FLU/RSV testing.  Fact Sheet for Patients: BloggerCourse.com  Fact Sheet for Healthcare Providers: SeriousBroker.it  This test is not yet approved or cleared by the [place]  FDA and has been authorized for detection and/or diagnosis of SARS-CoV-2 by FDA under an Emergency Use Authorization (EUA). This EUA will remain in effect (meaning this test can be used) for the duration of the COVID-19 declaration under Section 564(b)(1) of the Act, 21 U.S.C. section 360bbb-3(b)(1), unless the authorization is terminated or revoked.     Resp Syncytial Virus by PCR NEGATIVE NEGATIVE Final    Comment: (NOTE) Fact Sheet for  Patients: BloggerCourse.com  Fact Sheet for Healthcare Providers: SeriousBroker.it  This test is not yet approved or cleared by the [place]  FDA and has been authorized for detection and/or diagnosis of SARS-CoV-2 by FDA under an Emergency Use Authorization (EUA). This EUA will remain in effect (meaning this test can be used) for the duration of the COVID-19 declaration under Section 564(b)(1) of the Act, 21 U.S.C. section 360bbb-3(b)(1), unless the authorization is terminated or revoked.  Performed at [facility], [address], [place], [place] [address]   Respiratory (~20 pathogens) panel by PCR     Status: Abnormal   Collection Time: 05/29/23 10:22 PM   Specimen: Nasopharyngeal Swab; Respiratory  Result Value Ref Range Status   Adenovirus NOT DETECTED NOT DETECTED Final   Coronavirus 229E NOT DETECTED NOT DETECTED Final    Comment: (NOTE) The Coronavirus on the Respiratory Panel, DOES NOT test for the novel  Coronavirus (2019 nCoV)    Coronavirus HKU1 NOT DETECTED NOT DETECTED Final   Coronavirus NL63 NOT DETECTED NOT DETECTED Final   Coronavirus OC43 NOT DETECTED NOT DETECTED Final   Metapneumovirus NOT DETECTED NOT DETECTED Final   Rhinovirus / Enterovirus NOT DETECTED NOT DETECTED Final   Influenza A NOT DETECTED NOT DETECTED Final   Influenza B NOT DETECTED NOT DETECTED Final   Parainfluenza Virus 1 DETECTED (A) NOT DETECTED Final   Parainfluenza Virus 2 NOT DETECTED NOT DETECTED Final   Parainfluenza Virus 3 NOT DETECTED NOT DETECTED Final   Parainfluenza Virus 4 NOT DETECTED NOT DETECTED Final   Respiratory Syncytial Virus NOT DETECTED NOT DETECTED Final   Bordetella pertussis NOT DETECTED NOT DETECTED Final   Bordetella Parapertussis NOT DETECTED NOT DETECTED Final   Chlamydophila pneumoniae NOT DETECTED NOT DETECTED Final   Mycoplasma pneumoniae NOT DETECTED NOT DETECTED Final    Comment:  Performed at [facility], [address], [place], [place] [address]  MRSA Next Gen by PCR, Nasal     Status: None   Collection Time: 05/29/23 10:22 PM   Specimen: Nasal Mucosa; Nasal Swab  Result Value Ref Range Status   MRSA by PCR Next Gen NOT DETECTED NOT DETECTED Final    Comment: (NOTE) The GeneXpert MRSA Assay (FDA approved for NASAL specimens only), is one component of a comprehensive MRSA colonization surveillance program. It is not intended to diagnose MRSA infection nor to guide or monitor treatment for MRSA infections. Test performance is not FDA approved in patients less than 1 years old. Performed at [facility], [address], [place], [place] [address]     Labs: CBC: Recent Labs  Lab 05/29/23 1621 05/30/23 0349  WBC 5.3 6.4  NEUTROABS 3.9  --   HGB 8.9* 11.0*  HCT 27.3* 33.9*  MCV 92.9 90.4  PLT 118* 151   Basic  Metabolic Panel: Recent Labs  Lab 05/29/23 2213 05/30/23 0349  NA 136 139  K 3.7 4.4  CL 108 111  CO2 18* 17*  GLUCOSE 165* 145*  BUN 24* 26*  CREATININE 1.37* 1.01  CALCIUM  8.4* 8.6*  MG 2.4 2.3  PHOS 2.5 2.6   Liver Function Tests: Recent Labs  Lab 05/29/23 2213 05/30/23 0349  AST 31 23  ALT 14 13  ALKPHOS 53 47  BILITOT 0.5 0.7  PROT 6.8 6.1*  ALBUMIN 3.8 3.2*   CBG: No results for input(s): "GLUCAP" in the last 168 hours.  Discharge time spent: greater than 30 minutes.  Signed: Ozell Blunt, MD [facility] 05/30/2023

## 2023-05-30 NOTE — Plan of Care (Signed)
  Problem: Clinical Measurements: Goal: Respiratory complications will improve Outcome: Progressing   Problem: Coping: Goal: Level of anxiety will decrease Outcome: Progressing   Problem: Pain Managment: Goal: General experience of comfort will improve and/or be controlled Outcome: Progressing

## 2023-05-30 NOTE — Care Management CC44 (Signed)
 Condition Code 44 Documentation Completed  Patient Details  Name: James Barker MRN: [id number] Date of Birth: Jun 02, 1950   Condition Code 44 given:  Yes Patient signature on Condition Code 44 notice:  Yes Documentation of 2 MD's agreement:  Yes Code 44 added to claim:  Yes    [name], LCSW 05/30/2023, 12:57 PM

## 2023-05-30 NOTE — Progress Notes (Signed)
   05/30/23 5284  Chest Physiotherapy Tx  CPT Delivery Source Flutter valve (Patient refuses)

## 2023-05-30 NOTE — Care Management Obs Status (Signed)
 MEDICARE OBSERVATION STATUS NOTIFICATION   Patient Details  Name: James Barker MRN: [id number] Date of Birth: 1950/09/30   Medicare Observation Status Notification Given:  Yes    Gertha Ku, LCSW 05/30/2023, 12:58 PM

## 2023-05-30 NOTE — Plan of Care (Signed)

## 2023-05-30 NOTE — Progress Notes (Incomplete)
 [facility]  PROGRESS NOTE  James Barker ZOX:[id number] DOB: Jun 26, 1950 DOA: 05/29/2023 PCP: Sinda Duel, PA   Brief HPI:    73 y.o. 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     Presented with  shortness of breath, cough Presents with respiratory shortness of breath body aches and chills feeling tight in the chest increase work of breathing with retractions patient at baseline not on oxygen EMS started on 7 L Known history of COPD history of PE status post IVC filter and MAI infection    Assessment/Plan:   CKD-3A  -chronic avoid nephrotoxic medications such as NSAIDs, Vanco Zosyn combo,  avoid hypotension, continue to follow renal function     History of bladder cancer s/p TURBT and Chemo in [place] Chronic stable   Presence of IVC filter Due to history of bilateral PEs   Chest pain Musculoskeletal in nature reproducible by palpation worse with coughing ordered Robaxin  for muscle spasms recommended splinting  incentive spirometer Flutter valve   Hypertension Continue Norvasc  10 mg a day   History of pulmonary embolus (PE) Continue Eliquis  for now   Asthma with COPD with exacerbation (HCC)  -  - Will initiate: Steroid taper  -  Antibiotics azithromycin  - Albuterol   PRN, - scheduled duoneb,  -  Breo or Dulera  at discharge   -  Mucinex .  Titrate O2 to saturation >90%. Follow patients respiratory status.  VBG no evidence of hypercarbia  Currently mentating well no evidence of symptomatic hypercarbia     Acute respiratory failure with hypoxia (HCC)  this patient has acute respiratory failure with Hypoxia  as documented by the presence of following: O2 saturatio< 90% on RA  Likely due to:  COPD exacerbation,   Provide O2 therapy and titrate as needed  Continuous pulse ox   check Pulse ox with ambulation prior to discharge    may need  TC consult for home O2 set up    flutter valve ordered     AKI (acute kidney injury) (HCC) AKI on chronic CKD.  Noticed somewhat worsening renal function will rehydrate      Medications     amLODipine   10 mg Oral Daily   apixaban   5 mg Oral BID   atorvastatin   20 mg Oral Daily   guaiFENesin   600 mg Oral BID   ipratropium-albuterol   3 mL Nebulization TID   methylPREDNISolone  (SOLU-MEDROL ) injection  40 mg Intravenous Q12H   Followed by   Cecily Cohen ON 05/31/2023] predniSONE   40 mg Oral Q breakfast   senna-docusate  1 tablet Oral BID     Data Reviewed:   CBG:  No results for input(s): "GLUCAP" in the last 168 hours.  SpO2: 97 % O2 Flow Rate (L/min): 5 L/min    Vitals:   05/29/23 2118 05/29/23 2149 05/30/23 0101 05/30/23 0422  BP:  (!) 146/85 131/81 137/76  Pulse:   78 77  Resp:   20 20  Temp:   97.6 F (36.4 C) (!) 97.5 F (36.4 C)  TempSrc:   Oral Oral  SpO2:   97% 97%  Weight: 79.8 kg     Height: 5\' 6"  (1.676 m)         Data Reviewed:  Basic Metabolic Panel: Recent Labs  Lab 05/29/23 2213 05/30/23 0349  NA 136 139  K 3.7 4.4  CL 108 111  CO2 18* 17*  GLUCOSE 165* 145*  BUN 24* 26*  CREATININE 1.37* 1.01  CALCIUM  8.4* 8.6*  MG 2.4 2.3  PHOS 2.5 2.6    CBC: Recent Labs  Lab 05/29/23 1621 05/30/23 0349  WBC 5.3 6.4  NEUTROABS 3.9  --   HGB 8.9* 11.0*  HCT 27.3* 33.9*  MCV 92.9 90.4  PLT 118* 151    LFT Recent Labs  Lab 05/29/23 2213 05/30/23 0349  AST 31 23  ALT 14 13  ALKPHOS 53 47  BILITOT 0.5 0.7  PROT 6.8 6.1*  ALBUMIN 3.8 3.2*     Antibiotics: Anti-infectives (From admission, onward)    Start     Dose/Rate Route Frequency Ordered Stop   05/30/23 2000  azithromycin  (ZITHROMAX ) 500 mg in sodium chloride  0.9 % 250 mL IVPB        500 mg 250 mL/hr over 60 Minutes Intravenous Every 24 hours 05/29/23 2155     05/29/23 1845  azithromycin  (ZITHROMAX ) 500 mg in sodium chloride  0.9 % 250 mL IVPB        500 mg 250  mL/hr over 60 Minutes Intravenous  Once 05/29/23 1843 05/29/23 2131        DVT prophylaxis: ***  Code Status: ***  Family Communication: ***   CONSULTS ***   Subjective   ***   Objective    Physical Examination:   General:  *** Cardiovascular: *** Respiratory: *** Abdomen: *** Extremities: ***  Neurologic:  ***   Status is: Inpatient:  ***           Montine Hight S Tammra Pressman   [facility] If 7PM-7AM, please contact night-coverage at [email or web address], Office  [phone]   05/30/2023, 8:57 AM  LOS: 1 day

## 2023-05-30 NOTE — Assessment & Plan Note (Signed)
 AKI on chronic CKD.  Noticed somewhat worsening renal function will rehydrate

## 2023-06-03 DIAGNOSIS — J441 Chronic obstructive pulmonary disease with (acute) exacerbation: Secondary | ICD-10-CM | POA: Diagnosis not present

## 2023-06-05 DIAGNOSIS — M25541 Pain in joints of right hand: Secondary | ICD-10-CM | POA: Diagnosis not present

## 2023-06-05 DIAGNOSIS — M25641 Stiffness of right hand, not elsewhere classified: Secondary | ICD-10-CM | POA: Diagnosis not present

## 2023-06-05 DIAGNOSIS — M1811 Unilateral primary osteoarthritis of first carpometacarpal joint, right hand: Secondary | ICD-10-CM | POA: Diagnosis not present

## 2023-06-09 ENCOUNTER — Other Ambulatory Visit: Payer: Self-pay | Admitting: Pharmacy Technician

## 2023-06-09 ENCOUNTER — Other Ambulatory Visit: Payer: Self-pay

## 2023-06-09 NOTE — Progress Notes (Signed)
 Specialty Pharmacy Refill Coordination Note  James Barker is a 73 y.o. male contacted today regarding refills of specialty medication(s) Dupilumab  (Dupixent )   Patient requested (Patient-Rptd) Delivery   Delivery date: (Patient-Rptd) 06/16/23 New Delivery Date 06/18/23   Verified address: (Patient-Rptd) [address], [place]  [address]   Medication will be filled on 06/17/23  Spoke with patient about new delivery date

## 2023-06-18 ENCOUNTER — Ambulatory Visit: Payer: Self-pay

## 2023-06-18 MED ORDER — APIXABAN 5 MG PO TABS
5.0000 mg | ORAL_TABLET | Freq: Two times a day (BID) | ORAL | 2 refills | Status: DC
Start: 2023-06-18 — End: 2023-06-26

## 2023-06-18 NOTE — Telephone Encounter (Signed)
 Reason for Disposition . [1] Prescription refill request for ESSENTIAL medicine (i.e., likelihood of harm to patient if not taken) AND [2] triager unable to refill per department policy  Answer Assessment - Initial Assessment Questions 1. DRUG NAME: "What medicine do you need to have refilled?"     Eliquis  5 mg  2. REFILLS REMAINING: "How many refills are remaining?" (Note: The label on the medicine or pill bottle will show how many refills are remaining. If there are no refills remaining, then a renewal may be needed.)     0 3. EXPIRATION DATE: "What is the expiration date?" (Note: The label states when the prescription will expire, and thus can no longer be refilled.)     NA 4. PRESCRIBING HCP: "Who prescribed it?" Reason: If prescribed by specialist, call should be referred to that group.     Pulm  Protocols used: Medication Refill and Renewal Call-A-AH

## 2023-06-18 NOTE — Telephone Encounter (Signed)
 Copied From CRM [id number]. Reason for Triage: Medication refill for ELIQUIS  5 MG TABS tablet - Please send to [facility] #[id number] - [place], [place] - [address] AT [facility] [address] [place] [address] Phone: [phone] Fax: [phone]    Refill request.

## 2023-06-23 DIAGNOSIS — M25541 Pain in joints of right hand: Secondary | ICD-10-CM | POA: Diagnosis not present

## 2023-06-23 DIAGNOSIS — M1811 Unilateral primary osteoarthritis of first carpometacarpal joint, right hand: Secondary | ICD-10-CM | POA: Diagnosis not present

## 2023-06-23 DIAGNOSIS — M25641 Stiffness of right hand, not elsewhere classified: Secondary | ICD-10-CM | POA: Diagnosis not present

## 2023-06-26 ENCOUNTER — Other Ambulatory Visit: Payer: Self-pay | Admitting: Internal Medicine

## 2023-06-26 MED ORDER — APIXABAN 5 MG PO TABS
5.0000 mg | ORAL_TABLET | Freq: Two times a day (BID) | ORAL | 2 refills | Status: DC
Start: 2023-06-26 — End: 2023-06-30

## 2023-06-26 NOTE — Telephone Encounter (Signed)
 OK refill Eliquis .  Patients last OV 01/29/2023 with Dr. Dione Franks.  Per office note:  Continue eliquis . Return to Care: Return in about 6 months (around 07/29/2023).   Patient has OV with Dr. Dione Franks on 09/08/2023. Will ok refill x  3.  Rx sent to pharmacy.

## 2023-06-26 NOTE — Telephone Encounter (Signed)
Duplicate request This encounter was created in error - please disregard.

## 2023-06-26 NOTE — Telephone Encounter (Signed)
 Pt has been out of medication for 2 weeks, he notes a verbal was supposed to have been called in but pharmacy states they do not have an order and are unable to fill. Routing for review/authorization.

## 2023-06-27 DIAGNOSIS — R1310 Dysphagia, unspecified: Secondary | ICD-10-CM | POA: Diagnosis not present

## 2023-06-27 DIAGNOSIS — D649 Anemia, unspecified: Secondary | ICD-10-CM | POA: Diagnosis not present

## 2023-06-27 DIAGNOSIS — K219 Gastro-esophageal reflux disease without esophagitis: Secondary | ICD-10-CM | POA: Diagnosis not present

## 2023-06-27 DIAGNOSIS — Z7901 Long term (current) use of anticoagulants: Secondary | ICD-10-CM | POA: Diagnosis not present

## 2023-06-27 DIAGNOSIS — Z86711 Personal history of pulmonary embolism: Secondary | ICD-10-CM | POA: Diagnosis not present

## 2023-06-30 ENCOUNTER — Other Ambulatory Visit: Payer: Self-pay

## 2023-06-30 ENCOUNTER — Ambulatory Visit: Payer: 59 | Admitting: Infectious Diseases

## 2023-06-30 ENCOUNTER — Other Ambulatory Visit: Payer: Self-pay | Admitting: Student

## 2023-06-30 DIAGNOSIS — R748 Abnormal levels of other serum enzymes: Secondary | ICD-10-CM

## 2023-06-30 MED ORDER — APIXABAN 5 MG PO TABS: 5.0000 mg | ORAL_TABLET | Freq: Two times a day (BID) | ORAL | 2 refills | Status: DC

## 2023-07-02 ENCOUNTER — Telehealth: Payer: Self-pay

## 2023-07-02 NOTE — Telephone Encounter (Signed)
 Copied from CRM [id number]. Topic: Clinical - Medication Question >> Jun 30, 2023  3:23 PM Hilton Lucky wrote: Reason for CRM: Patient is calling to inquire if eliquis  is stuck. Informed that as of 06/05, medication was reordered to indicated pharmacy. Advised patient to check with pharmacy and return call if not available. >> Jun 30, 2023  3:44 PM Hilton Lucky wrote: Patient is returning call to state the , despite EPIC documentation, patient's Rx for Eliquis  is not available and they have not received the script. Touched base with NT, RN Laverta Potters advised that, as patient is not symptomatic and they are limited on what can be done aside from what already has been done, agent should attempt to reach CAL. Contacted CAL, CAL states nurse of Dr. Dione Franks will be resending the medication to the pharmacy once more.   Patient requesting an update should any further hiccups occur. Advised patient to await pharmacy notice of reception at this time but if he has not heard, to return call. Routing to clinical per CAL advisement.   Spoke with patient regarding prior message. Patient stated he was abl to get his Eliquis . Patient's voice was understanding.Nothing else further needed.

## 2023-07-04 ENCOUNTER — Telehealth: Payer: Self-pay

## 2023-07-04 NOTE — Telephone Encounter (Signed)
 Received surgical assessment form from [facility] physicians. Colonoscopy and endoscopy with propofol - date is TBD.  LOV 01/2023 with pending appt 09/08/2023.  I spoke with patient and he requested a sooner appointment. Appt scheduled 07/09/2023 at 11:15

## 2023-07-07 ENCOUNTER — Ambulatory Visit
Admission: RE | Admit: 2023-07-07 | Discharge: 2023-07-07 | Disposition: A | Discharge status: Home / Self Care | Source: Ambulatory Visit | Attending: Student

## 2023-07-07 DIAGNOSIS — K449 Diaphragmatic hernia without obstruction or gangrene: Secondary | ICD-10-CM | POA: Diagnosis not present

## 2023-07-07 DIAGNOSIS — Z8551 Personal history of malignant neoplasm of bladder: Secondary | ICD-10-CM | POA: Diagnosis not present

## 2023-07-07 DIAGNOSIS — N323 Diverticulum of bladder: Secondary | ICD-10-CM | POA: Diagnosis not present

## 2023-07-07 DIAGNOSIS — R748 Abnormal levels of other serum enzymes: Secondary | ICD-10-CM

## 2023-07-07 DIAGNOSIS — K8020 Calculus of gallbladder without cholecystitis without obstruction: Secondary | ICD-10-CM | POA: Diagnosis not present

## 2023-07-07 MED ORDER — IOPAMIDOL (ISOVUE-300) INJECTION 61%
100.0000 mL | Freq: Once | INTRAVENOUS | Status: AC | PRN
  Administered 2023-07-07: 100 mL via INTRAVENOUS

## 2023-07-09 ENCOUNTER — Ambulatory Visit (INDEPENDENT_AMBULATORY_CARE_PROVIDER_SITE_OTHER): Admitting: Internal Medicine

## 2023-07-09 ENCOUNTER — Encounter: Payer: Self-pay | Admitting: Internal Medicine

## 2023-07-09 VITALS — BP 121/70 | HR 57 | Temp 97.7°F | Ht 67.0 in | Wt 174.0 lb

## 2023-07-09 DIAGNOSIS — J4489 Other specified chronic obstructive pulmonary disease: Secondary | ICD-10-CM | POA: Diagnosis not present

## 2023-07-09 DIAGNOSIS — K224 Dyskinesia of esophagus: Secondary | ICD-10-CM | POA: Diagnosis not present

## 2023-07-09 DIAGNOSIS — Z95828 Presence of other vascular implants and grafts: Secondary | ICD-10-CM | POA: Diagnosis not present

## 2023-07-09 DIAGNOSIS — I288 Other diseases of pulmonary vessels: Secondary | ICD-10-CM | POA: Diagnosis not present

## 2023-07-09 NOTE — Patient Instructions (Addendum)
 It was a pleasure to see you today!  Please schedule follow up with myself in 4 months.  If my schedule is not open yet, we will contact you with a reminder closer to that time. Please call [phone] if you haven't heard from us  a month before, and always call us  sooner if issues or concerns arise. You can also send us  a message through MyChart, but but aware that this is not to be used for urgent issues and it may take up to 5-7 days to receive a reply. Please be aware that you will likely be able to view your results before I have a chance to respond to them. Please give us  5 business days to respond to any non-urgent results.    Before your next visit I would like you to have: Echocardiogram - to rule out pulmonary hypertension  I will refer you to Interventional Radiology for removal of the IVC Filter  Continue advair , spiriva , and dupixent . Continue albuterol  inhaler as needed. Continue eliquis , ok to hold this 24 hours before your Endoscopy/Colonoscopy and resume the next day.   Let's continue dupixent  for now, if managing reflux symptoms does not help the coughing can consider changing to a different injectable medication.   What is GERD? Gastroesophageal reflux disease (GERD) is gastroesophageal reflux diseasewhich occurs when the lower esophageal sphincter (LES) opens spontaneously, for varying periods of time, or does not close properly and stomach contents rise up into the esophagus. GER is also called acid reflux or acid regurgitation, because digestive juices--called acids--rise up with the food. The esophagus is the tube that carries food from the mouth to the stomach. The LES is a ring of muscle at the bottom of the esophagus that acts like a valve between the esophagus and stomach.  When acid reflux occurs, food or fluid can be tasted in the back of the mouth. When refluxed stomach acid touches the lining of the esophagus it may cause a burning sensation in the chest or throat  called heartburn or acid indigestion. Occasional reflux is common. Persistent reflux that occurs more than twice a week is considered GERD, and it can eventually lead to more serious health problems. People of all ages can have GERD. Studies have shown that GERD may worsen or contribute to asthma, chronic cough, and pulmonary fibrosis.   What are the symptoms of GERD? The main symptom of GERD in adults is frequent heartburn, also called acid indigestion--burning-type pain in the lower part of the mid-chest, behind the breast bone, and in the mid-abdomen.  Not all reflux is acidic in nature, and many patients don't have heart burn at all. Sometimes it feels like a cough (either dry or with mucus), choking sensation, asthma, shortness of breath, waking up at night, frequent throat clearing, or trouble swallowing.    What causes GERD? The reason some people develop GERD is still unclear. However, research shows that in people with GERD, the LES relaxes while the rest of the esophagus is working. Anatomical abnormalities such as a hiatal hernia may also contribute to GERD. A hiatal hernia occurs when the upper part of the stomach and the LES move above the diaphragm, the muscle wall that separates the stomach from the chest. Normally, the diaphragm helps the LES keep acid from rising up into the esophagus. When a hiatal hernia is present, acid reflux can occur more easily. A hiatal hernia can occur in people of any age and is most often a normal finding in  otherwise healthy people over age 65. Most of the time, a hiatal hernia produces no symptoms.   Other factors that may contribute to GERD include - Obesity or recent weight gain - Pregnancy  - Smoking  - Diet - Certain medications  Common foods that can worsen reflux symptoms include: - carbonated beverages - artificial sweeteners - citrus fruits  - chocolate  - drinks with caffeine or alcohol  - fatty and fried foods  - garlic and onions  -  mint flavorings  - spicy foods  - tomato-based foods, like spaghetti sauce, salsa, chili, and pizza   Lifestyle Changes If you smoke, stop.  Avoid foods and beverages that worsen symptoms (see above.) Lose weight if needed.  Eat small, frequent meals.  Wear loose-fitting clothes.  Avoid lying down for 3 hours after a meal.  Raise the head of your bed 6 to 8 inches by securing wood blocks under the bedposts. Just using extra pillows will not help, but using a wedge-shaped pillow may be helpful.  Medications  H2 blockers, such as cimetidine (Tagamet HB), famotidine (Pepcid AC), nizatidine (Axid AR), and ranitidine (Zantac 75), decrease acid production. They are available in prescription strength and over-the-counter strength. These drugs provide short-term relief and are effective for about half of those who have GERD symptoms.  Proton pump inhibitors include omeprazole (Prilosec, Zegerid), lansoprazole (Prevacid), pantoprazole  (Protonix ), rabeprazole (Aciphex), and esomeprazole (Nexium), which are available by prescription. Prilosec is also available in over-the-counter strength. Proton pump inhibitors are more effective than H2 blockers and can relieve symptoms and heal the esophageal lining in almost everyone who has GERD.  Because drugs work in different ways, combinations of medications may help control symptoms. People who get heartburn after eating may take both antacids and H2 blockers. The antacids work first to neutralize the acid in the stomach, and then the H2 blockers act on acid production. By the time the antacid stops working, the H2 blocker will have stopped acid production. Your health care provider is the best source of information about how to use medications for GERD.   Points to Remember 1. You can have GERD without having heartburn. Your symptoms could include a dry cough, asthma symptoms, or trouble swallowing.  2. Taking medications daily as prescribed is important in  controlling you symptoms.  Sometimes it can take up to 8 weeks to fully achieve the effects of the medications prescribed.  3. Coughing related to GERD can be difficult to treat and is very frustrating!  However, it is important to stick with these medications and lifestyle modifications before pursuing more aggressive or invasive test and treatments.

## 2023-07-09 NOTE — Telephone Encounter (Signed)
 Dr. Desai- are you able to add risk assessment to his note from today- pt having colonoscopy/endoscopy with propofol .

## 2023-07-09 NOTE — Progress Notes (Addendum)
 James Barker    [id number]    1950/10/15  Primary Care Physician:[name], Derrel Flies, PA Date of Appointment: 07/09/2023 Established Patient Visit  Chief complaint:   Chief Complaint  Patient presents with   Follow-up    Some wheezing today.  Wants to review chest CT done while in hospital on 05/30/2023.     HPI: James Barker is a 73 y.o. man with history of severe allergic asthma COPD overlap syndrome. Dupixent  started 04/26/2021. 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 Tobacco use disorder history, quit in 1992.   Interval Updates: Here for follow up after recent hospitalization in May 2025 for COPD exacerbation. Treated with steroids and antibiotis. Was treated with oxygen therapy. Weaned to room air before discharge.   CT Chest suggested esophageal dysmotility.  He is having coughing paroxysms.  Feels a tickle in his throat which causes coughing. Improved with drinking water.  He was started on pantoprazole  in the hospital but doesn't feel it is making much of a difference.   Overall prednisone  use has decreased dramaitically since starting dupixent .   Current Regimen: spiriva , advair , mucinex ,  prn albuterol  usually takes 3-4 times/day. dupixent .  Asthma Triggers: allergies, viral infections.  Exacerbations in the last year: 2 History of hospitalization or intubation: yes in [place]  and [place]  Allergy Testing: sensitization to aspergillus in 2023 on immunocap testing GERD: denies Allergic Rhinitis: occasional ACT:  Asthma Control Test ACT Total Score  07/09/2023 11:15 AM 13  01/29/2023 11:30 AM 12  03/13/2022  1:35 PM 12   FeNO: 53 ppb   I have reviewed the patient's family social and past medical history and updated as appropriate.   Past Medical History:  Diagnosis Date   Anemia    Arthritis    Asthma-COPD overlap syndrome [facility])    pulmonology--- dr n Kaidence Sant;   last exacerbation 05-08-2021   Benign  localized prostatic hyperplasia with lower urinary tract symptoms (LUTS)    Chronic cough    due to copd   Chronic kidney disease    Dairy product intolerance    DOE (dyspnea on exertion)    d/t asthma/ COPD   Dyspnea    Emphysema, unspecified (HCC)    Full dentures    History of bladder cancer 2020   s/p TURBT w/ chemo instillation   History of COVID-19    per pt 2021 (no at same time of pneumonia)  w/ moderate symptoms , recovered at home, that resolved back to baseline   History of hepatitis C 2018   hx chronic hepatitis C,  per pt treated and told was cured   History of latent tuberculosis 2017   pt tested positive for TB exposure, not active, treated w/ INH for 9 months   History of MAI infection 2021   never treated   History of pulmonary embolism    (05-09-2021  pt stated never had clot prior to 2017 , none since 2020, had negative blood disorder work-up)//  2017  treated w/ xarelto 6 months//  and recurrent 04/ 2020 in setting severe UTI and deconditioning w/ prolonged admission due to intolerant to anticoagulants IVC filter inserted   Hypertension    OSA (obstructive sleep apnea)    per pt dx yrs ago, intolerant to cpap   Pneumonia    Pulmonary eosinophilia (HCC)    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   Weak urinary stream  Wears glasses     Past Surgical History:  Procedure Laterality Date   ANAL FISTULECTOMY     x5  last one 2008   BRONCHOSCOPY  2021   per pt had pneumonia   (done in [place] )   INGUINAL HERNIA REPAIR Right 2010   IVC FILTER INSERTION  2020   in [place]   ORIF WRIST FRACTURE Left 2016   SHOULDER ARTHROSCOPY W/ ROTATOR CUFF REPAIR Left 2016   SHOULDER ARTHROSCOPY WITH ROTATOR CUFF REPAIR AND SUBACROMIAL DECOMPRESSION Left 05/09/2022   Procedure: SHOULDER ARTHROSCOPY WITH ROTATOR CUFF REPAIR AND SUBACROMIAL DECOMPRESSION;  Surgeon: Sammye Cristal, MD;  Location: [facility];  Service: Orthopedics;  Laterality: Left;   TRANSURETHRAL RESECTION OF BLADDER TUMOR   2020   in [place]   TRANSURETHRAL RESECTION OF PROSTATE  2021   in [place]   TRANSURETHRAL RESECTION OF PROSTATE N/A 05/11/2021   Procedure: PROSTATE EXAM UNDER ANESTHESIA; TRANSURETHRAL RESECTION OF THE PROSTATE (TURP), BLADDER BIOPSY;  Surgeon: Christina Coyer, MD;  Location: [facility];  Service: Urology;  Laterality: N/A;   UMBILICAL HERNIA REPAIR  2011    Family History  Problem Relation Age of Onset   Pulmonary embolism Mother    Emphysema Father    Lung cancer Sister    Asthma Child    Asthma Child     Social History   Occupational History   Not on file  Tobacco Use   Smoking status: Former    Current packs/day: 0.00    Average packs/day: 1 pack/day for 20.0 years (20.0 ttl pk-yrs)    Types: Cigarettes    Start date: 4    Quit date: 33    Years since quitting: 33.4   Smokeless tobacco: Never  Vaping Use   Vaping status: Never Used  Substance and Sexual Activity   Alcohol use: Not Currently   Drug use: Yes    Types: Marijuana    Comment: 05-09-2021  per pt smokes occasional   Sexual activity: Not on file     Physical Exam: Blood pressure 121/70, pulse (!) 57, temperature 97.7 F (36.5 C), temperature source Oral, height 5' 7 (1.702 m), weight 174 lb (78.9 kg), SpO2 95%.  Gen: no distress CV: RRR no mrg Resp: ctab no wheezes or crackles  Data Reviewed: Imaging: I have personally reviewed the CTPE study June 2024 which is negative for PE, no lobar pneumonia, mild bibasilar atelectasis and 3mm right anterior lung nodule. Sabersheath trachea  PFTs:     Latest Ref Rng & Units 02/20/2021    2:58 PM  PFT Results  FVC-Pre L 3.20   FVC-Predicted Pre % 84   FVC-Post L 3.30   FVC-Predicted Post % 87   Pre FEV1/FVC % % 49   Post FEV1/FCV % % 51   FEV1-Pre L 1.57   FEV1-Predicted Pre % 56   FEV1-Post L 1.70   DLCO uncorrected ml/min/mmHg 14.74   DLCO UNC% % 64   DLCO corrected ml/min/mmHg 14.74   DLCO COR %Predicted % 64   DLVA Predicted %  67   TLC L 6.54   TLC % Predicted % 105   RV % Predicted % 131    I have personally reviewed the patient's PFTs and moderately severe airflow limitation. Reduced diffusion capacity  Labs: Rhinovirus positive June 2024 virus panel  Immunization status: Immunization History  Administered Date(s) Administered   Fluad Trivalent(High Dose 65+) 01/28/2023   Influenza, High Dose Seasonal PF 12/08/2020   Moderna Sars-Covid-2 Vaccination  03/25/2019, 04/22/2019, 09/30/2019   Pneumococcal-Unspecified 12/08/2020    External Records Personally Reviewed: ID  Assessment:  Allergic Asthma COPD Overlap Syndrome on dupixent .  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 Continue albuterol  inhaler as needed. Continue eliquis , ok to hold this 24 hours before your Endoscopy/Colonoscopy and resume the next day.  Low risk for pulmonary complications based on ARISCAT index.  Let's continue dupixent  for now, if managing reflux symptoms does not help the coughing can consider changing to a different injectable medication.   Overall his symptoms are much improved since starting dupixent . CT Chest shows some tracheomalacia exacerbated by Inhaled steroids and tobacco use (former.)  Would limit ICS as able and use pulmicort  nebs during flare only. May need to evaluate for PAP therapy in future. Needs sputum cultures when he has a flare to evaluate for resistant organisms given his h/o colonization.   I spent 30 minutes in the care of  this patient today including pre-charting, chart review, review of results, face-to-face care, coordination of care and communication with consultants etc.).   Return to Care: Return in about 4 months (around 11/08/2023).   Louie Rover, MD Pulmonary and Critical Care Medicine [facility] [phone]

## 2023-07-09 NOTE — Telephone Encounter (Signed)
 I located the risk assessment in the note from 07/09/23 and have faxed it to [facility].

## 2023-07-10 ENCOUNTER — Encounter (INDEPENDENT_AMBULATORY_CARE_PROVIDER_SITE_OTHER): Payer: Self-pay

## 2023-07-10 ENCOUNTER — Other Ambulatory Visit: Payer: Self-pay

## 2023-07-11 ENCOUNTER — Other Ambulatory Visit: Payer: Self-pay

## 2023-07-11 ENCOUNTER — Other Ambulatory Visit (HOSPITAL_COMMUNITY): Payer: Self-pay

## 2023-07-11 DIAGNOSIS — J449 Chronic obstructive pulmonary disease, unspecified: Secondary | ICD-10-CM | POA: Diagnosis not present

## 2023-07-11 DIAGNOSIS — N1831 Chronic kidney disease, stage 3a: Secondary | ICD-10-CM | POA: Diagnosis not present

## 2023-07-11 DIAGNOSIS — I10 Essential (primary) hypertension: Secondary | ICD-10-CM | POA: Diagnosis not present

## 2023-07-11 DIAGNOSIS — J418 Mixed simple and mucopurulent chronic bronchitis: Secondary | ICD-10-CM | POA: Diagnosis not present

## 2023-07-11 NOTE — Progress Notes (Signed)
 Specialty Pharmacy Refill Coordination Note  MyChart Questionnaire Submission  James Barker is a 73 y.o. male contacted today regarding refills of specialty medication(s) Dupixent .  Injection date: 07/17/23.   Patient requested: (Patient-Rptd) Delivery   Delivery date: 07/15/23   Verified address: (Patient-Rptd) [address]  [place]  Medication will be filled on 07/14/23.

## 2023-07-14 DIAGNOSIS — M25641 Stiffness of right hand, not elsewhere classified: Secondary | ICD-10-CM | POA: Diagnosis not present

## 2023-07-14 DIAGNOSIS — M1811 Unilateral primary osteoarthritis of first carpometacarpal joint, right hand: Secondary | ICD-10-CM | POA: Diagnosis not present

## 2023-07-14 DIAGNOSIS — M25541 Pain in joints of right hand: Secondary | ICD-10-CM | POA: Diagnosis not present

## 2023-07-21 DIAGNOSIS — J418 Mixed simple and mucopurulent chronic bronchitis: Secondary | ICD-10-CM | POA: Diagnosis not present

## 2023-07-21 DIAGNOSIS — E7800 Pure hypercholesterolemia, unspecified: Secondary | ICD-10-CM | POA: Diagnosis not present

## 2023-07-21 DIAGNOSIS — J449 Chronic obstructive pulmonary disease, unspecified: Secondary | ICD-10-CM | POA: Diagnosis not present

## 2023-07-29 ENCOUNTER — Other Ambulatory Visit: Payer: Self-pay | Admitting: Internal Medicine

## 2023-07-29 DIAGNOSIS — Z95828 Presence of other vascular implants and grafts: Secondary | ICD-10-CM

## 2023-08-04 ENCOUNTER — Ambulatory Visit
Admission: RE | Admit: 2023-08-04 | Discharge: 2023-08-04 | Disposition: A | Discharge status: Home / Self Care | Source: Ambulatory Visit | Attending: Internal Medicine

## 2023-08-04 DIAGNOSIS — Z95828 Presence of other vascular implants and grafts: Secondary | ICD-10-CM

## 2023-08-04 DIAGNOSIS — Z86718 Personal history of other venous thrombosis and embolism: Secondary | ICD-10-CM | POA: Diagnosis not present

## 2023-08-05 ENCOUNTER — Encounter (INDEPENDENT_AMBULATORY_CARE_PROVIDER_SITE_OTHER): Payer: Self-pay

## 2023-08-05 ENCOUNTER — Other Ambulatory Visit: Payer: Self-pay

## 2023-08-05 ENCOUNTER — Other Ambulatory Visit (HOSPITAL_COMMUNITY): Payer: Self-pay

## 2023-08-05 ENCOUNTER — Other Ambulatory Visit: Payer: Self-pay | Admitting: Internal Medicine

## 2023-08-05 DIAGNOSIS — D721 Eosinophilia, unspecified: Secondary | ICD-10-CM

## 2023-08-05 DIAGNOSIS — J4489 Other specified chronic obstructive pulmonary disease: Secondary | ICD-10-CM

## 2023-08-05 NOTE — Progress Notes (Signed)
 Specialty Pharmacy Refill Coordination Note  MyChart Questionnaire Submission  James Barker is a 73 y.o. male contacted today regarding refills of specialty medication(s) Dupixent .  Injection date: (Patient-Rptd) 08/14/23   Patient requested: (Patient-Rptd) Delivery   Delivery date: 08/08/23   Verified address: (Patient-Rptd) [address] [place]  Medication will be filled on 08/07/23.  This fill date is pending response to refill request from provider. Patient is aware and if they have not received fill by intended date, they must follow up with pharmacy.

## 2023-08-06 ENCOUNTER — Other Ambulatory Visit: Payer: Self-pay

## 2023-08-06 ENCOUNTER — Ambulatory Visit: Payer: Self-pay | Admitting: Internal Medicine

## 2023-08-06 ENCOUNTER — Other Ambulatory Visit (HOSPITAL_COMMUNITY): Payer: Self-pay

## 2023-08-06 MED ORDER — DUPIXENT 300 MG/2ML ~~LOC~~ SOAJ
300.0000 mg | SUBCUTANEOUS | 0 refills | Status: DC
  Filled 2023-08-06: qty 4, 28d supply, fill #0
  Filled 2023-08-29: qty 4, 28d supply, fill #1
  Filled 2023-09-26: qty 4, 28d supply, fill #2

## 2023-08-06 NOTE — Telephone Encounter (Signed)
 Refill sent for DUPIXENT  to [facility]: [phone]   Dose: 300mg  subcut every 14 days  Last OV: 07/09/2023 Provider: Dr. Meade  Next OV: 09/08/2023  Sherry Pennant, PharmD, MPH, BCPS Clinical Pharmacist (Rheumatology and Pulmonology)

## 2023-08-07 ENCOUNTER — Other Ambulatory Visit (HOSPITAL_COMMUNITY): Payer: Self-pay

## 2023-08-08 ENCOUNTER — Ambulatory Visit (HOSPITAL_COMMUNITY)
Admission: RE | Admit: 2023-08-08 | Discharge: 2023-08-08 | Disposition: A | Discharge status: Home / Self Care | Source: Ambulatory Visit | Attending: Internal Medicine | Admitting: Internal Medicine

## 2023-08-08 DIAGNOSIS — I272 Pulmonary hypertension, unspecified: Secondary | ICD-10-CM | POA: Diagnosis not present

## 2023-08-08 DIAGNOSIS — I288 Other diseases of pulmonary vessels: Secondary | ICD-10-CM | POA: Diagnosis not present

## 2023-08-08 DIAGNOSIS — I10 Essential (primary) hypertension: Secondary | ICD-10-CM | POA: Diagnosis not present

## 2023-08-08 DIAGNOSIS — I358 Other nonrheumatic aortic valve disorders: Secondary | ICD-10-CM | POA: Insufficient documentation

## 2023-08-08 LAB — ECHOCARDIOGRAM COMPLETE
Area-P 1/2: 3.27 cm2
Calc EF: 67.7 %
S' Lateral: 2.6 cm
Single Plane A2C EF: 66.7 %
Single Plane A4C EF: 67.6 %

## 2023-08-09 DIAGNOSIS — I10 Essential (primary) hypertension: Secondary | ICD-10-CM | POA: Diagnosis not present

## 2023-08-09 DIAGNOSIS — J449 Chronic obstructive pulmonary disease, unspecified: Secondary | ICD-10-CM | POA: Diagnosis not present

## 2023-08-09 DIAGNOSIS — J418 Mixed simple and mucopurulent chronic bronchitis: Secondary | ICD-10-CM | POA: Diagnosis not present

## 2023-08-09 DIAGNOSIS — N1831 Chronic kidney disease, stage 3a: Secondary | ICD-10-CM | POA: Diagnosis not present

## 2023-08-11 ENCOUNTER — Ambulatory Visit: Payer: Self-pay | Admitting: Internal Medicine

## 2023-08-11 NOTE — Telephone Encounter (Signed)
**Note De-identified  [place] Obfuscation** Please advise 

## 2023-08-13 ENCOUNTER — Ambulatory Visit
Admission: RE | Admit: 2023-08-13 | Discharge: 2023-08-13 | Disposition: A | Discharge status: Home / Self Care | Source: Ambulatory Visit | Attending: Internal Medicine | Admitting: Internal Medicine

## 2023-08-13 DIAGNOSIS — Z95828 Presence of other vascular implants and grafts: Secondary | ICD-10-CM | POA: Diagnosis not present

## 2023-08-13 NOTE — Consult Note (Signed)
 Virtual Visit via Telephone Note  I connected with James Barker on @TODAY @ at 10:00 AM EDT by telephone and verified that I am speaking with the correct person using two identifiers.  Location: Patient: restaurant (the patient stated he had forgotten about the visit, but insisted on continuing with the tele visit) Provider: [facility]   I discussed the limitations, risks, security and privacy concerns of performing an evaluation and management service by telephone and the availability of in person appointments. I also discussed with the patient that there may be a patient responsible charge related to this service. The patient expressed understanding and agreed to proceed.   History of Present Illness: The consult for IVC retrieval placed by Dr Meade.  The patient had an IVC filter placed at an out of state facility for an acute PE.  Since that time the patient began taking Eliquis  (currently taking).  The filter is no longer needed.  CT Abd/Pel 07/14/23 was reviewed and independently interpreted by me demonstrating a retrievable Elite style IVC filter in an infrarenal location with mild to moderate posterior tilting.  The hook appears to be clear of the wall of the IVC and the IVC is widely patent.  No thrombus seen in the filter.  The bilateral venous lower extremity ultrasound study from 07/14/2023 was also reviewed and independently interpreted by me demonstrates no DVT.   Observations/Objective:   Assessment and Plan: We discussed IVC filter retrieval with fluoroscopy and moderate sedation.  The procedure, risks, benefits, and alternatives were discussed.  All of his questions were answered.  He will remain on Eliquis  and we will attempt retrieval as an outpatient in the hospital.  Since the retrieval hook and filter are somewhat tilted it may require more aggressive manipulation to extract. The patient will be scheduled at his earliest convenience for IVC filter retrieval.  Follow Up  Instructions:    I discussed the assessment and treatment plan with the patient. The patient was provided an opportunity to ask questions and all were answered. The patient agreed with the plan and demonstrated an understanding of the instructions.   The patient was advised to call back or seek an in-person evaluation if the symptoms worsen or if the condition fails to improve as anticipated.  I provided 20 minutes of non-face-to-face time during this encounter.   James DELENA Banner, MD

## 2023-08-18 ENCOUNTER — Other Ambulatory Visit: Payer: Self-pay | Admitting: Internal Medicine

## 2023-08-18 DIAGNOSIS — Z95828 Presence of other vascular implants and grafts: Secondary | ICD-10-CM

## 2023-08-21 DIAGNOSIS — I10 Essential (primary) hypertension: Secondary | ICD-10-CM | POA: Diagnosis not present

## 2023-08-21 DIAGNOSIS — J449 Chronic obstructive pulmonary disease, unspecified: Secondary | ICD-10-CM | POA: Diagnosis not present

## 2023-08-21 DIAGNOSIS — E7800 Pure hypercholesterolemia, unspecified: Secondary | ICD-10-CM | POA: Diagnosis not present

## 2023-08-21 DIAGNOSIS — N1831 Chronic kidney disease, stage 3a: Secondary | ICD-10-CM | POA: Diagnosis not present

## 2023-08-21 DIAGNOSIS — J418 Mixed simple and mucopurulent chronic bronchitis: Secondary | ICD-10-CM | POA: Diagnosis not present

## 2023-08-28 ENCOUNTER — Other Ambulatory Visit: Payer: Self-pay | Admitting: Radiology

## 2023-08-28 ENCOUNTER — Encounter (INDEPENDENT_AMBULATORY_CARE_PROVIDER_SITE_OTHER): Payer: Self-pay

## 2023-08-28 DIAGNOSIS — Z95828 Presence of other vascular implants and grafts: Secondary | ICD-10-CM

## 2023-08-28 NOTE — H&P (Signed)
 Chief Complaint: IVC filter no longer required - IR consulted for IVC filter removal  Referring Provider(s): Meade Verdon RAMAN, MD   Supervising Physician: Jenna Hacker  Patient Status: [name] - Out-pt  History of Present Illness: James Barker is a 73 y.o. male with prior history of pulmonary embolism that [place].  Patient has since started Eliquis .  Patient has met with IR attending Dr. Jenna at outpatient appointment on 08/13/2023.  At that visit patient's CT abdomen pelvis from 07/14/2023 and bilateral lower extremity venous ultrasound from 08/04/2023 was reviewed by Dr. Jenna.  These demonstrated a retrievable infrarenal IVC filter, and no lower extremity DVT on ultrasound.  Decision was made with patient to proceed forward with IVC removal as an outpatient.  Patient now presenting to IR service today for this.  *** Patient is Full Code  Past Medical History:  Diagnosis Date   Anemia    Arthritis    Asthma-COPD overlap syndrome [facility])    pulmonology--- dr n desai;   last exacerbation 05-08-2021   Benign localized prostatic hyperplasia with lower urinary tract symptoms (LUTS)    Chronic cough    due to copd   Chronic kidney disease    Dairy product intolerance    DOE (dyspnea on exertion)    d/t asthma/ COPD   Dyspnea    Emphysema, unspecified (HCC)    Full dentures    History of bladder cancer 2020   s/p TURBT w/ chemo instillation   History of COVID-19    per pt 2021 (no at same time of pneumonia)  w/ moderate symptoms , recovered at home, that resolved back to baseline   History of hepatitis C 2018   hx chronic hepatitis C,  per pt treated and told was cured   History of latent tuberculosis 2017   pt tested positive for TB exposure, not active, treated w/ INH for 9 months   History of MAI infection 2021   never treated   History of pulmonary embolism    (05-09-2021  pt stated never had clot prior to 2017  , none since 2020, had negative blood disorder work-up)//  2017  treated w/ xarelto 6 months//  and recurrent 04/ 2020 in setting severe UTI and deconditioning w/ prolonged admission due to intolerant to anticoagulants IVC filter inserted   Hypertension    OSA (obstructive sleep apnea)    per pt dx yrs ago, intolerant to cpap   Pneumonia    Pulmonary eosinophilia (HCC)    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   Weak urinary stream    Wears glasses     Past Surgical History:  Procedure Laterality Date   ANAL FISTULECTOMY     x5  last one 2008   BRONCHOSCOPY  2021   per pt had pneumonia   (done in [place] )   INGUINAL HERNIA REPAIR Right 2010   IVC FILTER INSERTION  2020   in [place]   ORIF WRIST FRACTURE Left 2016   SHOULDER ARTHROSCOPY W/ ROTATOR CUFF REPAIR Left 2016   SHOULDER ARTHROSCOPY WITH ROTATOR CUFF REPAIR AND SUBACROMIAL DECOMPRESSION Left 05/09/2022   Procedure: SHOULDER ARTHROSCOPY WITH ROTATOR CUFF REPAIR AND SUBACROMIAL DECOMPRESSION;  Surgeon: Dozier Soulier, MD;  Location: [facility];  Service: Orthopedics;  Laterality: Left;   TRANSURETHRAL RESECTION OF BLADDER TUMOR  2020   in [place]   TRANSURETHRAL RESECTION OF PROSTATE  2021   in [place]   TRANSURETHRAL RESECTION  OF PROSTATE N/A 05/11/2021   Procedure: PROSTATE EXAM UNDER ANESTHESIA; TRANSURETHRAL RESECTION OF THE PROSTATE (TURP), BLADDER BIOPSY;  Surgeon: Nieves Cough, MD;  Location: [facility];  Service: Urology;  Laterality: N/A;   UMBILICAL HERNIA REPAIR  2011    Allergies: Cheese and Lactose intolerance (gi)  Medications: Prior to Admission medications   Medication Sig Start Date End Date Taking? Authorizing Provider  albuterol  (PROVENTIL ) (2.5 MG/3ML) 0.083% nebulizer solution Take 3 mLs (2.5 mg total) by nebulization every 6 (six) hours as needed for wheezing or shortness of breath. 04/26/21   Cobb, Comer GAILS, NP  albuterol  (VENTOLIN  HFA) 108 (90 Base) MCG/ACT inhaler TAKE 2 PUFFS BY MOUTH EVERY 6 HOURS AS  NEEDED FOR WHEEZE OR SHORTNESS OF BREATH 09/19/22   Meade Verdon RAMAN, MD  amLODipine  (NORVASC ) 5 MG tablet Take 1 tablet (5 mg total) by mouth daily. 05/30/23   Drusilla Sabas RAMAN, MD  apixaban  (ELIQUIS ) 5 MG TABS tablet Take 1 tablet (5 mg total) by mouth 2 (two) times daily. 06/30/23   Desai, Nikita S, MD  atorvastatin  (LIPITOR) 20 MG tablet Take 20 mg by mouth daily. 12/07/20   [provider]  benzonatate  (TESSALON ) 100 MG capsule Take 1 capsule (100 mg total) by mouth 3 (three) times daily as needed for cough. 05/30/23   Drusilla Sabas RAMAN, MD  Dupilumab  (DUPIXENT ) 300 MG/2ML SOAJ Inject 300 mg into the skin every 14 days. 08/06/23   Desai, Nikita S, MD  fluticasone -salmeterol (WIXELA INHUB ) 250-50 MCG/ACT AEPB Inhale 1 puff into the lungs in the morning and at bedtime. 05/01/23   Desai, Nikita S, MD  lisinopril (ZESTRIL) 10 MG tablet Take 10 mg by mouth daily.    [provider]  pantoprazole  (PROTONIX ) 40 MG tablet Take 1 tablet (40 mg total) by mouth daily at 12 noon. 05/31/23   Drusilla Sabas RAMAN, MD  Tiotropium Bromide  Monohydrate (SPIRIVA  RESPIMAT) 2.5 MCG/ACT AERS Inhale 2 puffs into the lungs daily. 01/29/23   Meade Verdon RAMAN, MD     Family History  Problem Relation Age of Onset   Pulmonary embolism Mother    Emphysema Father    Lung cancer Sister    Asthma Child    Asthma Child     Social History   Socioeconomic History   Marital status: Divorced    Spouse name: Not on file   Number of children: Not on file   Years of education: Not on file   Highest education level: Not on file  Occupational History   Not on file  Tobacco Use   Smoking status: Former    Current packs/day: 0.00    Average packs/day: 1 pack/day for 20.0 years (20.0 ttl pk-yrs)    Types: Cigarettes    Start date: 62    Quit date: 35    Years since quitting: 33.6   Smokeless tobacco: Never  Vaping Use   Vaping status: Never Used  Substance and Sexual Activity   Alcohol use: Not Currently   Drug use:  Yes    Types: Marijuana    Comment: 05-09-2021  per pt smokes occasional   Sexual activity: Not on file  Other Topics Concern   Not on file  Social History Narrative   Not on file   Social Drivers of Health   Financial Resource Strain: Not on file  Food Insecurity: No Food Insecurity (05/29/2023)   Hunger Vital Sign    Worried About Running Out of Food in the Last Year:  Never true    Ran Out of Food in the Last Year: Never true  Transportation Needs: No Transportation Needs (05/29/2023)   PRAPARE - Administrator, Civil Service (Medical): No    Lack of Transportation (Non-Medical): No  Physical Activity: Not on file  Stress: Not on file  Social Connections: Moderately Isolated (05/29/2023)   Social Connection and Isolation Panel    Frequency of Communication with Friends and Family: More than three times a week    Frequency of Social Gatherings with Friends and Family: More than three times a week    Attends Religious Services: Never    Database administrator or Organizations: No    Attends Banker Meetings: Never    Marital Status: Living with partner     Review of Systems: A 12 point ROS discussed and pertinent positives are indicated in the HPI above.  All other systems are negative.  Review of Systems  Vital Signs: There were no vitals taken for this visit.  Advance Care Plan: The advanced care place/surrogate decision maker was discussed at the time of visit and the patient did not wish to discuss or was not able to name a surrogate decision maker or provide an advance care plan.  Physical Exam  Imaging: ECHOCARDIOGRAM COMPLETE Result Date: 08/08/2023    ECHOCARDIOGRAM REPORT   Patient Name:   KRISTAPHER DUBUQUE Date of Exam: 08/08/2023 Medical Rec #:  [id number]         Height:       67.0 in Accession #:    [id number]        Weight:       174.0 lb Date of Birth:  30-Jan-1950         BSA:          1.906 m Patient Age:    73 years          BP:            137/82 mmHg Patient Gender: M                 HR:           45 bpm. Exam Location:  Outpatient Procedure: 2D Echo, 3D Echo, Cardiac Doppler, Color Doppler and Strain Analysis            (Both Spectral and Color Flow Doppler were utilized during            procedure). Indications:    Pulmonary hypertension  History:        Patient has prior history of Echocardiogram examinations, most                 recent 05/16/2021. Risk Factors:Hypertension.  Sonographer:    Philomena Daring Referring Phys: NIKITA S DESAI IMPRESSIONS  1. Left ventricular ejection fraction, by estimation, is 60 to 65%. The left ventricle has normal function. The left ventricle has no regional wall motion abnormalities. Left ventricular diastolic parameters were normal. The average left ventricular global longitudinal strain is -18.4 %. The global longitudinal strain is normal.  2. Right ventricular systolic function is normal. The right ventricular size is normal. Tricuspid regurgitation signal is inadequate for assessing PA pressure.  3. The mitral valve is normal in structure. Trivial mitral valve regurgitation. No evidence of mitral stenosis.  4. The aortic valve is tricuspid. There is mild calcification of the aortic valve. Aortic valve regurgitation is not visualized. No aortic stenosis is present.  5. The inferior  vena cava is normal in size with greater than 50% respiratory variability, suggesting right atrial pressure of 3 mmHg. FINDINGS  Left Ventricle: Left ventricular ejection fraction, by estimation, is 60 to 65%. The left ventricle has normal function. The left ventricle has no regional wall motion abnormalities. The average left ventricular global longitudinal strain is -18.4 %. Strain was performed and the global longitudinal strain is normal. The left ventricular internal cavity size was normal in size. There is no left ventricular hypertrophy. Left ventricular diastolic parameters were normal. Right Ventricle: The right ventricular  size is normal. No increase in right ventricular wall thickness. Right ventricular systolic function is normal. Tricuspid regurgitation signal is inadequate for assessing PA pressure. Left Atrium: Left atrial size was normal in size. Right Atrium: Right atrial size was normal in size. Pericardium: There is no evidence of pericardial effusion. Mitral Valve: The mitral valve is normal in structure. Trivial mitral valve regurgitation. No evidence of mitral valve stenosis. Tricuspid Valve: The tricuspid valve is normal in structure. Tricuspid valve regurgitation is not demonstrated. No evidence of tricuspid stenosis. Aortic Valve: The aortic valve is tricuspid. There is mild calcification of the aortic valve. Aortic valve regurgitation is not visualized. No aortic stenosis is present. Pulmonic Valve: The pulmonic valve was normal in structure. Pulmonic valve regurgitation is trivial. No evidence of pulmonic stenosis. Aorta: The aortic root is normal in size and structure. Venous: The inferior vena cava is normal in size with greater than 50% respiratory variability, suggesting right atrial pressure of 3 mmHg. IAS/Shunts: No atrial level shunt detected by color flow Doppler.  LEFT VENTRICLE PLAX 2D LVIDd:         4.50 cm     Diastology LVIDs:         2.60 cm     LV e' medial:    10.60 cm/s LV PW:         0.90 cm     LV E/e' medial:  8.7 LV IVS:        1.10 cm     LV e' lateral:   12.30 cm/s LVOT diam:     2.10 cm     LV E/e' lateral: 7.5 LV SV:         88 LV SV Index:   46          2D Longitudinal Strain LVOT Area:     3.46 cm    2D Strain GLS Avg:     -18.4 %  LV Volumes (MOD) LV vol d, MOD A2C: 64.3 ml LV vol d, MOD A4C: 81.1 ml LV vol s, MOD A2C: 21.4 ml LV vol s, MOD A4C: 26.3 ml LV SV MOD A2C:     42.9 ml LV SV MOD A4C:     81.1 ml LV SV MOD BP:      50.4 ml RIGHT VENTRICLE             IVC RV S prime:     12.40 cm/s  IVC diam: 0.60 cm TAPSE (M-mode): 2.2 cm LEFT ATRIUM             Index        RIGHT ATRIUM            Index LA diam:        3.00 cm 1.57 cm/m   RA Area:     16.70 cm LA Vol (A2C):   51.6 ml 27.07 ml/m  RA Volume:   42.40 ml  22.25 ml/m  LA Vol (A4C):   52.8 ml 27.70 ml/m LA Biplane Vol: 51.8 ml 27.18 ml/m  AORTIC VALVE LVOT Vmax:   116.00 cm/s LVOT Vmean:  69.200 cm/s LVOT VTI:    0.255 m  AORTA Ao Root diam: 3.60 cm Ao Asc diam:  3.70 cm MITRAL VALVE MV Area (PHT): 3.27 cm    SHUNTS MV Decel Time: 232 msec    Systemic VTI:  0.26 m MV E velocity: 91.70 cm/s  Systemic Diam: 2.10 cm MV A velocity: 52.40 cm/s MV E/A ratio:  1.75 Toribio Fuel MD Electronically signed by Toribio Fuel MD Signature Date/Time: 08/08/2023/8:22:14 PM    Final    US  Venous Img Lower Bilateral (DVT) Result Date: 08/04/2023 CLINICAL DATA:  DVT follow-up. IVC filter placed in [place]  2021. EXAM: Bilateral Lower Extremity Venous Doppler Ultrasound TECHNIQUE: Gray-scale sonography with compression, as well as color and duplex ultrasound, were performed to evaluate the deep venous system(s) from the level of the common femoral vein through the popliteal and proximal calf veins. COMPARISON:  05/15/2021 FINDINGS: VENOUS Normal compressibility of the common femoral, superficial femoral, and popliteal veins, as well as the visualized calf veins. Visualized portions of profunda femoral vein and great saphenous vein unremarkable. No filling defects to suggest DVT on grayscale or color Doppler imaging. Doppler waveforms show normal direction of venous flow, normal respiratory plasticity and response to augmentation. OTHER None. Limitations: none IMPRESSION: No lower extremity DVT. Electronically Signed   By: Aliene Lloyd M.D.   On: 08/04/2023 14:40    Labs:  CBC: Recent Labs    05/29/23 1621 05/30/23 0349  WBC 5.3 6.4  HGB 8.9* 11.0*  HCT 27.3* 33.9*  PLT 118* 151    COAGS: No results for input(s): INR, APTT in the last 8760 hours.  BMP: Recent Labs    05/29/23 2213 05/30/23 0349  NA 136 139  K 3.7 4.4  CL  108 111  CO2 18* 17*  GLUCOSE 165* 145*  BUN 24* 26*  CALCIUM  8.4* 8.6*  CREATININE 1.37* 1.01  GFRNONAA 54* >60    LIVER FUNCTION TESTS: Recent Labs    05/29/23 2213 05/30/23 0349  BILITOT 0.5 0.7  AST 31 23  ALT 14 13  ALKPHOS 53 47  PROT 6.8 6.1*  ALBUMIN 3.8 3.2*    TUMOR MARKERS: No results for input(s): AFPTM, CEA, CA199, CHROMGRNA in the last 8760 hours.  Assessment and Plan:  SAI ZINN is a 73 y.o. male with prior history of pulmonary embolism that required [place]C filter [place].  Patient has since started Eliquis .  Patient has met with IR attending Dr. Jenna at outpatient appointment on 08/13/2023.  At that visit patient's CT abdomen pelvis from 07/14/2023 and bilateral lower extremity venous ultrasound from 08/04/2023 was reviewed by Dr. Jenna.  These demonstrated a retrievable infrarenal IVC filter, and no lower extremity DVT on ultrasound.  Decision was made with patient to proceed forward with IVC removal as an outpatient.  Patient now presenting to IR service today for this.  Risks and benefits discussed with the patient including, but not limited to bleeding, infection, contrast induced renal failure, filter fracture or migration which can lead to emergency surgery or even death, strut penetration with damage or irritation to adjacent structures and caval thrombosis.  All of the patient's questions were answered, patient is agreeable to proceed. Consent signed and in chart.   Thank you for allowing our service to participate in CLIF SERIO 's  care.  Electronically Signed: Kimble VEAR Clas, PA-C   08/28/2023, 3:43 PM      I spent a total of  30 Minutes   in face to face in clinical consultation, greater than 50% of which was counseling/coordinating care for IVC filter removal.

## 2023-08-29 ENCOUNTER — Other Ambulatory Visit: Payer: Self-pay

## 2023-08-29 ENCOUNTER — Other Ambulatory Visit: Payer: Self-pay | Admitting: Pharmacy Technician

## 2023-08-29 ENCOUNTER — Ambulatory Visit (HOSPITAL_COMMUNITY)
Admission: RE | Admit: 2023-08-29 | Discharge: 2023-08-29 | Disposition: A | Discharge status: Home / Self Care | Source: Ambulatory Visit

## 2023-08-29 ENCOUNTER — Encounter (HOSPITAL_COMMUNITY): Payer: Self-pay

## 2023-08-29 DIAGNOSIS — Z86711 Personal history of pulmonary embolism: Secondary | ICD-10-CM | POA: Diagnosis not present

## 2023-08-29 DIAGNOSIS — Z4682 Encounter for fitting and adjustment of non-vascular catheter: Secondary | ICD-10-CM | POA: Diagnosis not present

## 2023-08-29 DIAGNOSIS — Z7901 Long term (current) use of anticoagulants: Secondary | ICD-10-CM | POA: Insufficient documentation

## 2023-08-29 DIAGNOSIS — Z4589 Encounter for adjustment and management of other implanted devices: Secondary | ICD-10-CM | POA: Diagnosis not present

## 2023-08-29 DIAGNOSIS — Z95828 Presence of other vascular implants and grafts: Secondary | ICD-10-CM

## 2023-08-29 HISTORY — PX: IR IVC FILTER RETRIEVAL / S&I /IMG GUID/MOD SED: IMG5308

## 2023-08-29 LAB — CBC WITH DIFFERENTIAL/PLATELET
Abs Immature Granulocytes: 0.02 K/uL (ref 0.00–0.07)
Basophils Absolute: 0 K/uL (ref 0.0–0.1)
Basophils Relative: 1 %
Eosinophils Absolute: 0.1 K/uL (ref 0.0–0.5)
Eosinophils Relative: 2 %
HCT: 36.4 % — ABNORMAL LOW (ref 39.0–52.0)
Hemoglobin: 11.8 g/dL — ABNORMAL LOW (ref 13.0–17.0)
Immature Granulocytes: 0 %
Lymphocytes Relative: 22 %
Lymphs Abs: 1.4 K/uL (ref 0.7–4.0)
MCH: 29.7 pg (ref 26.0–34.0)
MCHC: 32.4 g/dL (ref 30.0–36.0)
MCV: 91.7 fL (ref 80.0–100.0)
Monocytes Absolute: 0.6 K/uL (ref 0.1–1.0)
Monocytes Relative: 10 %
Neutro Abs: 4.2 K/uL (ref 1.7–7.7)
Neutrophils Relative %: 65 %
Platelets: 187 K/uL (ref 150–400)
RBC: 3.97 MIL/uL — ABNORMAL LOW (ref 4.22–5.81)
RDW: 13.1 % (ref 11.5–15.5)
WBC: 6.4 K/uL (ref 4.0–10.5)
nRBC: 0 % (ref 0.0–0.2)

## 2023-08-29 LAB — COMPREHENSIVE METABOLIC PANEL WITH GFR
ALT: 18 U/L (ref 0–44)
AST: 27 U/L (ref 15–41)
Albumin: 3.7 g/dL (ref 3.5–5.0)
Alkaline Phosphatase: 47 U/L (ref 38–126)
Anion gap: 7 (ref 5–15)
BUN: 34 mg/dL — ABNORMAL HIGH (ref 8–23)
CO2: 24 mmol/L (ref 22–32)
Calcium: 9.3 mg/dL (ref 8.9–10.3)
Chloride: 110 mmol/L (ref 98–111)
Creatinine, Ser: 1.49 mg/dL — ABNORMAL HIGH (ref 0.61–1.24)
GFR, Estimated: 49 mL/min — ABNORMAL LOW (ref >60)
Glucose, Bld: 97 mg/dL (ref 70–99)
Potassium: 4.6 mmol/L (ref 3.5–5.1)
Sodium: 141 mmol/L (ref 135–145)
Total Bilirubin: 0.5 mg/dL (ref 0.0–1.2)
Total Protein: 6.7 g/dL (ref 6.5–8.1)

## 2023-08-29 LAB — PROTIME-INR
INR: 1.1 (ref 0.8–1.2)
Prothrombin Time: 15.1 s (ref 11.4–15.2)

## 2023-08-29 MED ORDER — MIDAZOLAM HCL 2 MG/2ML IJ SOLN
INTRAMUSCULAR | Status: AC
  Filled 2023-08-29: qty 2

## 2023-08-29 MED ORDER — FENTANYL CITRATE (PF) 100 MCG/2ML IJ SOLN
INTRAMUSCULAR | Status: AC | PRN
  Administered 2023-08-29: 50 ug via INTRAVENOUS

## 2023-08-29 MED ORDER — MIDAZOLAM HCL 2 MG/2ML IJ SOLN
INTRAMUSCULAR | Status: AC | PRN
  Administered 2023-08-29: 1 mg via INTRAVENOUS

## 2023-08-29 MED ORDER — FENTANYL CITRATE (PF) 100 MCG/2ML IJ SOLN
INTRAMUSCULAR | Status: AC
Start: 2023-08-29 — End: 2023-08-29
  Filled 2023-08-29: qty 2

## 2023-08-29 MED ORDER — LIDOCAINE HCL 1 % IJ SOLN
20.0000 mL | Freq: Once | INTRAMUSCULAR | Status: AC
  Administered 2023-08-29: 5 mL via INTRADERMAL

## 2023-08-29 MED ORDER — MIDAZOLAM HCL 2 MG/2ML IJ SOLN
INTRAMUSCULAR | Status: AC | PRN
Start: 2023-08-29 — End: 2023-08-29
  Administered 2023-08-29: 1 mg via INTRAVENOUS

## 2023-08-29 MED ORDER — LIDOCAINE HCL 1 % IJ SOLN
INTRAMUSCULAR | Status: AC
  Filled 2023-08-29: qty 20

## 2023-08-29 MED ORDER — FENTANYL CITRATE (PF) 100 MCG/2ML IJ SOLN
INTRAMUSCULAR | Status: AC
  Filled 2023-08-29: qty 2

## 2023-08-29 MED ORDER — SODIUM CHLORIDE 0.9 % IV SOLN: INTRAVENOUS | Status: DC

## 2023-08-29 MED ORDER — IOHEXOL 300 MG/ML  SOLN
50.0000 mL | Freq: Once | INTRAMUSCULAR | Status: AC | PRN
  Administered 2023-08-29: 20 mL

## 2023-08-29 NOTE — Procedures (Signed)
 Interventional Radiology Procedure Note  Procedure: IVC filter retrieval  Complications: None  Estimated Blood Loss: < 10 mL  Findings: R internal jugular access for IVC filter retrieval.  Cordella DELENA Banner, MD

## 2023-08-29 NOTE — Progress Notes (Signed)
 Specialty Pharmacy Refill Coordination Note  James Barker is a 73 y.o. male contacted today regarding refills of specialty medication(s) Dupilumab  (Dupixent )   Patient requested (Patient-Rptd) Delivery   Delivery date: 09/05/23  Verified address: (Patient-Rptd) [address]  [place] [address]   Medication will be filled on 09/04/23.

## 2023-08-29 NOTE — Discharge Instructions (Addendum)
 Moderate Conscious Sedation-Care After  This sheet gives you information about how to care for yourself after your procedure. Your health care provider may also give you more specific instructions. If you have problems or questions, contact your health care provider.  After the procedure, it is common to have: Sleepiness for several hours. Impaired judgment for several hours. Difficulty with balance. Vomiting if you eat too soon.  Follow these instructions at home:  Rest. Do not participate in activities where you could fall or become injured. Do not drive or use machinery. Do not drink alcohol. Do not take sleeping pills or medicines that cause drowsiness. Do not make important decisions or sign legal documents. Do not take care of children on your own.  Eating and drinking Follow the diet recommended by your health care provider. Drink enough fluid to keep your urine pale yellow. If you vomit: Drink water, juice, or soup when you can drink without vomiting. Make sure you have little or no nausea before eating solid foods.  General instructions Take over-the-counter and prescription medicines only as told by your health care provider. Have a responsible adult stay with you for the time you are told. It is important to have someone help care for you until you are awake and alert. Do not smoke. Keep all follow-up visits as told by your health care provider. This is important.  Contact a health care provider if: You are still sleepy or having trouble with balance after 24 hours. You feel light-headed. You keep feeling nauseous or you keep vomiting. You develop a rash. You have a fever. You have redness or swelling around the IV site.  Get help right away if: You have trouble breathing. You have new-onset confusion at home.  This information is not intended to replace advice given to you by your health care provider. Make sure you discuss any questions you have with your  healthcare provider.   Please call Interventional Radiology clinic [phone] with any questions or concerns.  You may remove your dressing and shower tomorrow.  After the procedure, it is common to have: Bruising that usually fades within 1-2 weeks Tenderness at the site  Follow these instructions at home:  Medication: Do not use Aspirin or ibuprofen products, such as Advil or Motrin, as it may increase bleeding You may resume your usual medications as ordered by your doctor If your doctor prescribed antibiotics, take them as directed. Do not stop taking them just because you feel better. You need to take the full course of antibiotics  Eating and drinking: Drink plenty of liquids to keep your urine pale yellow You can resume your regular diet as directed by your doctor   Care of the procedure site Follow instructions from your health care provider about how to take care of your insertion site. Make sure you: Wash your hands with soap and water before you change your bandage (dressing). If soap and water are not available, use hand sanitizer Change your dressing as told by your health care provider Leave stitches (sutures), skin glue, or adhesive strips in place. These skin closures may need to stay in place for 2 weeks or longer. If adhesive strip edges start to loosen and curl up, you may trim the loose edges. Do not remove adhesive strips completely unless your health care provider tells you to do that. You may shower tomorrow Gently wash the site with plain soap and water Pat the area dry with a clean towel Do not rub the  site. This may cause bleeding Do not apply powder or lotion to the site. Keep the site clean and dry. Check your site every day for signs of infection. Check for: Redness, swelling, or pain Fluid or blood Warmth Pus or a bad smell Activity For the first 2-3 days after your procedure, or as long as directed: Avoid climbing stairs as much as possible Do  not squat Do not lift anything that is heavier than 10 lb (4.5 kg), or the limit that you are told, until your health care provider says that it is safe. Rest as directed Avoid sitting for a long time without moving. Get up to take short walks every 1-2 hours. Do not take baths, swim, or use a hot tub until your health care provider approves. Take showers only. Keep all follow-up visits as told by your doctor Contact a health care provider if you have: A fever or chills You have redness, swelling, or pain around your insertion site Get help right away if: The catheter insertion area swells very fast You pass out You suddenly start to sweat or your skin gets clammy The catheter insertion area is bleeding, and the bleeding does not stop when you hold steady pressure on the area The area near or just beyond the catheter insertion site becomes pale, cool, tingly, or numb

## 2023-08-29 NOTE — Sedation Documentation (Signed)
 RN Lakysha Kossman pulled 4 mg Versed  and 200 mcg Fentanyl  in Ir room. Pt. Received 3 mg Versed  and 100 mcg Fentanyl  throughout the procedure. RN Satoshi Kalas wasted  1 mg Versed   with Nena Gerline PEAK. Rn Bishop Vanderwerf returned 100mcg Fentanyl  into IR room pysix.

## 2023-09-08 ENCOUNTER — Ambulatory Visit (INDEPENDENT_AMBULATORY_CARE_PROVIDER_SITE_OTHER): Admitting: Internal Medicine

## 2023-09-08 ENCOUNTER — Encounter: Payer: Self-pay | Admitting: Internal Medicine

## 2023-09-08 VITALS — BP 120/80 | HR 40 | Temp 98.7°F | Ht 67.0 in | Wt 171.0 lb

## 2023-09-08 DIAGNOSIS — J440 Chronic obstructive pulmonary disease with acute lower respiratory infection: Secondary | ICD-10-CM | POA: Diagnosis not present

## 2023-09-08 DIAGNOSIS — G4733 Obstructive sleep apnea (adult) (pediatric): Secondary | ICD-10-CM

## 2023-09-08 DIAGNOSIS — K219 Gastro-esophageal reflux disease without esophagitis: Secondary | ICD-10-CM

## 2023-09-08 DIAGNOSIS — J209 Acute bronchitis, unspecified: Secondary | ICD-10-CM

## 2023-09-08 NOTE — Patient Instructions (Addendum)
 It was a pleasure to see you today!  Please schedule follow up with myself in 2-3 months.  If my schedule is not open yet, we will contact you with a reminder closer to that time. Please call [phone] if you haven't heard from us  a month before, and always call us  sooner if issues or concerns arise. You can also send us  a message through MyChart, but but aware that this is not to be used for urgent issues and it may take up to 5-7 days to receive a reply. Please be aware that you will likely be able to view your results before I have a chance to respond to them. Please give us  5 business days to respond to any non-urgent results.    Before your next visit I would like you to have: Sleep study Endoscopy with [facility].   Continue advair , spiriva , and dupixent . Continue albuterol  inhaler as needed. Continue eliquis , ok to hold this 24 hours before your Endoscopy/Colonoscopy and resume the next day.   Follow up with the gastroenterologists regarding the coughing which I think is most likely related to reflux.   What is GERD? Gastroesophageal reflux disease (GERD) is gastroesophageal reflux diseasewhich occurs when the lower esophageal sphincter (LES) opens spontaneously, for varying periods of time, or does not close properly and stomach contents rise up into the esophagus. GER is also called acid reflux or acid regurgitation, because digestive juices--called acids--rise up with the food. The esophagus is the tube that carries food from the mouth to the stomach. The LES is a ring of muscle at the bottom of the esophagus that acts like a valve between the esophagus and stomach.  When acid reflux occurs, food or fluid can be tasted in the back of the mouth. When refluxed stomach acid touches the lining of the esophagus it may cause a burning sensation in the chest or throat called heartburn or acid indigestion. Occasional reflux is common. Persistent reflux that occurs more than twice a week is  considered GERD, and it can eventually lead to more serious health problems. People of all ages can have GERD. Studies have shown that GERD may worsen or contribute to asthma, chronic cough, and pulmonary fibrosis.   What are the symptoms of GERD? The main symptom of GERD in adults is frequent heartburn, also called acid indigestion--burning-type pain in the lower part of the mid-chest, behind the breast bone, and in the mid-abdomen.  Not all reflux is acidic in nature, and many patients don't have heart burn at all. Sometimes it feels like a cough (either dry or with mucus), choking sensation, asthma, shortness of breath, waking up at night, frequent throat clearing, or trouble swallowing.    What causes GERD? The reason some people develop GERD is still unclear. However, research shows that in people with GERD, the LES relaxes while the rest of the esophagus is working. Anatomical abnormalities such as a hiatal hernia may also contribute to GERD. A hiatal hernia occurs when the upper part of the stomach and the LES move above the diaphragm, the muscle wall that separates the stomach from the chest. Normally, the diaphragm helps the LES keep acid from rising up into the esophagus. When a hiatal hernia is present, acid reflux can occur more easily. A hiatal hernia can occur in people of any age and is most often a normal finding in otherwise healthy people over age 50. Most of the time, a hiatal hernia produces no symptoms.   Other  factors that may contribute to GERD include - Obesity or recent weight gain - Pregnancy  - Smoking  - Diet - Certain medications  Common foods that can worsen reflux symptoms include: - carbonated beverages - artificial sweeteners - citrus fruits  - chocolate  - drinks with caffeine or alcohol  - fatty and fried foods  - garlic and onions  - mint flavorings  - spicy foods  - tomato-based foods, like spaghetti sauce, salsa, chili, and pizza   Lifestyle  Changes If you smoke, stop.  Avoid foods and beverages that worsen symptoms (see above.) Lose weight if needed.  Eat small, frequent meals.  Wear loose-fitting clothes.  Avoid lying down for 3 hours after a meal.  Raise the head of your bed 6 to 8 inches by securing wood blocks under the bedposts. Just using extra pillows will not help, but using a wedge-shaped pillow may be helpful.  Medications  H2 blockers, such as cimetidine (Tagamet HB), famotidine (Pepcid AC), nizatidine (Axid AR), and ranitidine (Zantac 75), decrease acid production. They are available in prescription strength and over-the-counter strength. These drugs provide short-term relief and are effective for about half of those who have GERD symptoms.  Proton pump inhibitors include omeprazole (Prilosec, Zegerid), lansoprazole (Prevacid), pantoprazole  (Protonix ), rabeprazole (Aciphex), and esomeprazole (Nexium), which are available by prescription. Prilosec is also available in over-the-counter strength. Proton pump inhibitors are more effective than H2 blockers and can relieve symptoms and heal the esophageal lining in almost everyone who has GERD.  Because drugs work in different ways, combinations of medications may help control symptoms. People who get heartburn after eating may take both antacids and H2 blockers. The antacids work first to neutralize the acid in the stomach, and then the H2 blockers act on acid production. By the time the antacid stops working, the H2 blocker will have stopped acid production. Your health care provider is the best source of information about how to use medications for GERD.   Points to Remember 1. You can have GERD without having heartburn. Your symptoms could include a dry cough, asthma symptoms, or trouble swallowing.  2. Taking medications daily as prescribed is important in controlling you symptoms.  Sometimes it can take up to 8 weeks to fully achieve the effects of the medications  prescribed.  3. Coughing related to GERD can be difficult to treat and is very frustrating!  However, it is important to stick with these medications and lifestyle modifications before pursuing more aggressive or invasive test and treatments.

## 2023-09-08 NOTE — Progress Notes (Signed)
 James Barker    [id number]    April 18, 1950  Primary Care Physician:[name], Therisa MATSU, PA Date of Appointment: 09/08/2023 Established Patient Visit  Chief complaint:   Chief Complaint  Patient presents with   Asthma    Still wheezing    COPD     HPI: James Barker is a 73 y.o. man with history of severe allergic asthma COPD overlap syndrome. Dupixent  started 04/26/2021. 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 Tobacco use disorder history, quit in 1992.   Interval Updates: Here for follow up. He had an echocardiogram that shows normal RV systolic function and size.   He is having coughing spells and has colonoscopy and EGD scheduled in September.  Feels tickle in the back of his throat. The heart burn has improved with PPI.   No worseninig of chronic dyspnea.   He was diagnosed with sleep apnea 20 years ago and never wore the mask then.  He is having excessive daytime sleepiness while watching tv.   Epworth Sleepiness Scale  Chance of dozing off while sitting and reading? 0  1  2  3   4   2.   Chance of dozing off while watching TV?  0  1  2  3   4   3.   Chance of dozing off while Sitting, inactive in a public place?  0  1  2  3   4   4.   Chance of dozing off as a passenger in a car for an hour without a break?  0  1  2  3   4   5.   Chance of dozing off while lying down to rest in the afternoon when circumstances permit?  0  1  2  3  4   6.   Chance of dozing off while sitting and talking to someone?  0  1  2  3   4   7.   Chance of dozing off while sitting quietly after lunch without alcohol?  0  1  2  3  4   8.   Chance of dozing off while in a car, while stopped for a few minutes in traffic? 0  1  2  3   4     Total ESS 19/24     Overall prednisone  use has decreased dramaitically since starting dupixent .   Current Regimen: spiriva , advair , mucinex ,  prn albuterol  usually takes 3-4 times/day. dupixent .  Asthma  Triggers: allergies, viral infections.  Exacerbations in the last year: 2 History of hospitalization or intubation: yes in [place]  and [place]  Allergy Testing: sensitization to aspergillus in 2023 on immunocap testing GERD: denies Allergic Rhinitis: occasional ACT:  Asthma Control Test ACT Total Score  09/08/2023 11:17 AM 11  07/09/2023 11:15 AM 13  01/29/2023 11:30 AM 12   FeNO: 53 ppb   I have reviewed the patient's family social and past medical history and updated as appropriate.   Past Medical History:  Diagnosis Date   Anemia    Arthritis    Asthma-COPD overlap syndrome [facility])    pulmonology--- dr n Morna Flud;   last exacerbation 05-08-2021   Benign localized prostatic hyperplasia with lower urinary tract symptoms (LUTS)    Chronic cough    due to copd   Chronic kidney disease    Dairy product intolerance    DOE (dyspnea on exertion)    d/t asthma/ COPD   Dyspnea  Emphysema, unspecified (HCC)    Full dentures    History of bladder cancer 2020   s/p TURBT w/ chemo instillation   History of COVID-19    per pt 2021 (no at same time of pneumonia)  w/ moderate symptoms , recovered at home, that resolved back to baseline   History of hepatitis C 2018   hx chronic hepatitis C,  per pt treated and told was cured   History of latent tuberculosis 2017   pt tested positive for TB exposure, not active, treated w/ INH for 9 months   History of MAI infection 2021   never treated   History of pulmonary embolism    (05-09-2021  pt stated never had clot prior to 2017 , none since 2020, had negative blood disorder work-up)//  2017  treated w/ xarelto 6 months//  and recurrent 04/ 2020 in setting severe UTI and deconditioning w/ prolonged admission due to intolerant to anticoagulants IVC filter inserted   Hypertension    OSA (obstructive sleep apnea)    per pt dx yrs ago, intolerant to cpap   Pneumonia    Pulmonary eosinophilia (HCC)    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   Weak urinary  stream    Wears glasses     Past Surgical History:  Procedure Laterality Date   ANAL FISTULECTOMY     x5  last one 2008   BRONCHOSCOPY  2021   per pt had pneumonia   (done in [place] )   INGUINAL HERNIA REPAIR Right 2010   IR IVC FILTER RETRIEVAL / S&I /IMG GUID/MOD SED  08/29/2023   IVC FILTER INSERTION  2020   in [place]   ORIF WRIST FRACTURE Left 2016   SHOULDER ARTHROSCOPY W/ ROTATOR CUFF REPAIR Left 2016   SHOULDER ARTHROSCOPY WITH ROTATOR CUFF REPAIR AND SUBACROMIAL DECOMPRESSION Left 05/09/2022   Procedure: SHOULDER ARTHROSCOPY WITH ROTATOR CUFF REPAIR AND SUBACROMIAL DECOMPRESSION;  Surgeon: Dozier Soulier, MD;  Location: [facility];  Service: Orthopedics;  Laterality: Left;   TRANSURETHRAL RESECTION OF BLADDER TUMOR  2020   in [place]   TRANSURETHRAL RESECTION OF PROSTATE  2021   in [place]   TRANSURETHRAL RESECTION OF PROSTATE N/A 05/11/2021   Procedure: PROSTATE EXAM UNDER ANESTHESIA; TRANSURETHRAL RESECTION OF THE PROSTATE (TURP), BLADDER BIOPSY;  Surgeon: Nieves Cough, MD;  Location: [facility];  Service: Urology;  Laterality: N/A;   UMBILICAL HERNIA REPAIR  2011    Family History  Problem Relation Age of Onset   Pulmonary embolism Mother    Emphysema Father    Lung cancer Sister    Asthma Child    Asthma Child     Social History   Occupational History   Not on file  Tobacco Use   Smoking status: Former    Current packs/day: 0.00    Average packs/day: 1 pack/day for 20.0 years (20.0 ttl pk-yrs)    Types: Cigarettes    Start date: 51    Quit date: 70    Years since quitting: 33.6   Smokeless tobacco: Never  Vaping Use   Vaping status: Never Used  Substance and Sexual Activity   Alcohol use: Not Currently   Drug use: Yes    Types: Marijuana    Comment: 05-09-2021  per pt smokes occasional   Sexual activity: Not on file     Physical Exam: Blood pressure 120/80, pulse (!) 40, temperature 98.7 F (37.1 C), temperature source Oral, height 5' 7  (1.702 m), weight  171 lb (77.6 kg), SpO2 97%.  Gen: no distress CV: RRR no mrg Resp: ctab no wheezes or crackles  Data Reviewed: Imaging: I have personally reviewed the CTPE study June 2024 which is negative for PE, no lobar pneumonia, mild bibasilar atelectasis and 3mm right anterior lung nodule. Sabersheath trachea  PFTs:     Latest Ref Rng & Units 02/20/2021    2:58 PM  PFT Results  FVC-Pre L 3.20   FVC-Predicted Pre % 84   FVC-Post L 3.30   FVC-Predicted Post % 87   Pre FEV1/FVC % % 49   Post FEV1/FCV % % 51   FEV1-Pre L 1.57   FEV1-Predicted Pre % 56   FEV1-Post L 1.70   DLCO uncorrected ml/min/mmHg 14.74   DLCO UNC% % 64   DLCO corrected ml/min/mmHg 14.74   DLCO COR %Predicted % 64   DLVA Predicted % 67   TLC L 6.54   TLC % Predicted % 105   RV % Predicted % 131    I have personally reviewed the patient's PFTs and moderately severe airflow limitation. Reduced diffusion capacity  Labs: Rhinovirus positive June 2024 virus panel  Immunization status: Immunization History  Administered Date(s) Administered   Fluad Trivalent(High Dose 65+) 01/28/2023   Influenza, High Dose Seasonal PF 12/08/2020   Moderna Sars-Covid-2 Vaccination 03/25/2019, 04/22/2019, 09/30/2019   Pneumococcal-Unspecified 12/08/2020    External Records Personally Reviewed: ID  Assessment:  Allergic Asthma COPD Overlap Syndrome on dupixent .  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 Continue albuterol  inhaler as needed. Continue eliquis , ok to hold this 24 hours before your Endoscopy/Colonoscopy and resume the next day.  Low risk for pulmonary complications  based on ARISCAT index.  Suspect coughing is related to reflux vs esophageal dysmotility.   Sleep study ordered.   Let's continue dupixent  for now, if managing reflux symptoms does not help the coughing can consider changing to a different injectable medication.   Overall his symptoms are much improved since starting dupixent . CT Chest shows some tracheomalacia exacerbated by Inhaled steroids and tobacco use (former.)  Would limit ICS as able and use pulmicort  nebs during flare only. May need to evaluate for PAP therapy in future. Needs sputum cultures when he has a flare to evaluate for resistant organisms given his h/o colonization.   I spent 30 minutes in the care of this patient today including pre-charting, chart review, review of results, face-to-face care, coordination of care and communication with consultants etc.).   Return to Care: Return in about 8 weeks (around 11/03/2023).   Verdon Gore, MD Pulmonary and Critical Care Medicine [facility] [phone]

## 2023-09-11 DIAGNOSIS — J449 Chronic obstructive pulmonary disease, unspecified: Secondary | ICD-10-CM | POA: Diagnosis not present

## 2023-09-11 DIAGNOSIS — I10 Essential (primary) hypertension: Secondary | ICD-10-CM | POA: Diagnosis not present

## 2023-09-11 DIAGNOSIS — J418 Mixed simple and mucopurulent chronic bronchitis: Secondary | ICD-10-CM | POA: Diagnosis not present

## 2023-09-11 DIAGNOSIS — N1831 Chronic kidney disease, stage 3a: Secondary | ICD-10-CM | POA: Diagnosis not present

## 2023-09-21 DIAGNOSIS — I10 Essential (primary) hypertension: Secondary | ICD-10-CM | POA: Diagnosis not present

## 2023-09-21 DIAGNOSIS — E7800 Pure hypercholesterolemia, unspecified: Secondary | ICD-10-CM | POA: Diagnosis not present

## 2023-09-21 DIAGNOSIS — J418 Mixed simple and mucopurulent chronic bronchitis: Secondary | ICD-10-CM | POA: Diagnosis not present

## 2023-09-21 DIAGNOSIS — N1831 Chronic kidney disease, stage 3a: Secondary | ICD-10-CM | POA: Diagnosis not present

## 2023-09-21 DIAGNOSIS — J449 Chronic obstructive pulmonary disease, unspecified: Secondary | ICD-10-CM | POA: Diagnosis not present

## 2023-09-26 ENCOUNTER — Encounter (INDEPENDENT_AMBULATORY_CARE_PROVIDER_SITE_OTHER): Payer: Self-pay

## 2023-09-26 ENCOUNTER — Other Ambulatory Visit: Payer: Self-pay

## 2023-09-26 ENCOUNTER — Other Ambulatory Visit: Payer: Self-pay | Admitting: Pharmacy Technician

## 2023-09-26 NOTE — Progress Notes (Signed)
 Specialty Pharmacy Refill Coordination Note  James Barker is a 73 y.o. male contacted today regarding refills of specialty medication(s) Dupilumab  (Dupixent )   Patient requested (Patient-Rptd) Delivery   Delivery date: 10/07/23 Verified address: (Patient-Rptd) [address]  [place] [address]   Medication will be filled on 10/06/23.

## 2023-09-29 ENCOUNTER — Other Ambulatory Visit: Payer: Self-pay | Admitting: Gastroenterology

## 2023-09-29 ENCOUNTER — Telehealth: Payer: Self-pay | Admitting: Internal Medicine

## 2023-09-29 ENCOUNTER — Other Ambulatory Visit: Payer: Self-pay

## 2023-09-29 DIAGNOSIS — K219 Gastro-esophageal reflux disease without esophagitis: Secondary | ICD-10-CM | POA: Diagnosis not present

## 2023-09-29 DIAGNOSIS — R748 Abnormal levels of other serum enzymes: Secondary | ICD-10-CM | POA: Diagnosis not present

## 2023-09-29 NOTE — Telephone Encounter (Signed)
 Fax received from Dr. Layla with [facility] to perform a colonoscopy and endoscopy with propofol  on patient.  Patient needs surgery clearance. Surgery is 11/25/23. Patient was seen on 09/08/23. Office protocol is a risk assessment can be sent to surgeon if patient has been seen in 60 days or less.   The note from 09/08/23 with Dr Meade was faxed to [facility].

## 2023-10-02 ENCOUNTER — Other Ambulatory Visit: Payer: Self-pay

## 2023-10-06 ENCOUNTER — Other Ambulatory Visit: Payer: Self-pay

## 2023-10-07 DIAGNOSIS — M1811 Unilateral primary osteoarthritis of first carpometacarpal joint, right hand: Secondary | ICD-10-CM | POA: Diagnosis not present

## 2023-10-08 ENCOUNTER — Ambulatory Visit: Admitting: Cardiology

## 2023-10-11 DIAGNOSIS — N1831 Chronic kidney disease, stage 3a: Secondary | ICD-10-CM | POA: Diagnosis not present

## 2023-10-11 DIAGNOSIS — J449 Chronic obstructive pulmonary disease, unspecified: Secondary | ICD-10-CM | POA: Diagnosis not present

## 2023-10-11 DIAGNOSIS — I10 Essential (primary) hypertension: Secondary | ICD-10-CM | POA: Diagnosis not present

## 2023-10-11 DIAGNOSIS — J418 Mixed simple and mucopurulent chronic bronchitis: Secondary | ICD-10-CM | POA: Diagnosis not present

## 2023-10-20 ENCOUNTER — Telehealth: Payer: Self-pay | Admitting: Internal Medicine

## 2023-10-20 NOTE — Telephone Encounter (Signed)
 Fax received from Dr. Estel with [facility] to perform a colonoscopy/endoscopy with propofol  on patient.  Patient needs surgery clearance. Surgery is 11/25/23. Patient was seen on 09/08/23. Office protocol is a risk assessment can be sent to surgeon if patient has been seen in 60 days or less.   Risk assessment was done during ov with Meade 09/08/23- I have faxed this note to [facility] at [phone].

## 2023-10-21 DIAGNOSIS — J418 Mixed simple and mucopurulent chronic bronchitis: Secondary | ICD-10-CM | POA: Diagnosis not present

## 2023-10-21 DIAGNOSIS — N1831 Chronic kidney disease, stage 3a: Secondary | ICD-10-CM | POA: Diagnosis not present

## 2023-10-21 DIAGNOSIS — J449 Chronic obstructive pulmonary disease, unspecified: Secondary | ICD-10-CM | POA: Diagnosis not present

## 2023-10-21 DIAGNOSIS — I10 Essential (primary) hypertension: Secondary | ICD-10-CM | POA: Diagnosis not present

## 2023-10-21 DIAGNOSIS — E7800 Pure hypercholesterolemia, unspecified: Secondary | ICD-10-CM | POA: Diagnosis not present

## 2023-10-27 ENCOUNTER — Other Ambulatory Visit: Payer: Self-pay | Admitting: Internal Medicine

## 2023-10-27 ENCOUNTER — Other Ambulatory Visit: Payer: Self-pay

## 2023-10-27 ENCOUNTER — Other Ambulatory Visit (HOSPITAL_COMMUNITY): Payer: Self-pay

## 2023-10-27 DIAGNOSIS — D721 Eosinophilia, unspecified: Secondary | ICD-10-CM

## 2023-10-27 DIAGNOSIS — J4489 Other specified chronic obstructive pulmonary disease: Secondary | ICD-10-CM

## 2023-10-27 MED ORDER — DUPIXENT 300 MG/2ML ~~LOC~~ SOAJ
300.0000 mg | SUBCUTANEOUS | 0 refills | Status: DC
  Filled 2023-10-27: qty 12, 84d supply, fill #0
  Filled 2023-10-28: qty 4, 28d supply, fill #0
  Filled 2023-12-04: qty 4, 28d supply, fill #1
  Filled 2024-01-05: qty 4, 28d supply, fill #2

## 2023-10-27 NOTE — Telephone Encounter (Signed)
 Pt requesting refill of specialty medication - routing to Rx team to advise.

## 2023-10-27 NOTE — Telephone Encounter (Signed)
 Refill sent for DUPIXENT  to [facility]: [phone]   Dose: 300mg  [place] every 14 days   Last OV: 09/08/23 Provider: Dr. Meade  Next OV: 11/26/23  Aleck Puls, PharmD, BCPS Clinical Pharmacist  [facility]

## 2023-10-28 ENCOUNTER — Other Ambulatory Visit: Payer: Self-pay

## 2023-10-28 ENCOUNTER — Other Ambulatory Visit (HOSPITAL_COMMUNITY): Payer: Self-pay

## 2023-10-28 ENCOUNTER — Other Ambulatory Visit: Payer: Self-pay | Admitting: Internal Medicine

## 2023-10-28 ENCOUNTER — Other Ambulatory Visit: Payer: Self-pay | Admitting: Family Medicine

## 2023-10-28 MED ORDER — ALBUTEROL SULFATE HFA 108 (90 BASE) MCG/ACT IN AERS: 2.0000 | INHALATION_SPRAY | Freq: Four times a day (QID) | RESPIRATORY_TRACT | 5 refills | Status: AC | PRN

## 2023-10-28 NOTE — Telephone Encounter (Signed)
 Copied from CRM [id number]. Topic: Clinical - Medication Refill >> Oct 28, 2023 10:22 AM Rozanna MATSU wrote: Medication: albuterol  (VENTOLIN  HFA) 108 (90 Base) MCG/ACT inhaler  Has the patient contacted their pharmacy? Yes (Agent: If no, request that the patient contact the pharmacy for the refill. If patient does not wish to contact the pharmacy document the reason why and proceed with request.) (Agent: If yes, when and what did the pharmacy advise?)  This is the patient's preferred pharmacy:  [facility] STORE #[id number] [place], [place] - [address] AT [facility] [address] [place] [address] Phone: [phone] Fax: [phone]  Is this the correct pharmacy for this prescription? Yes If no, delete pharmacy and type the correct one.   Has the prescription been filled recently? No  Is the patient out of the medication? No  Has the patient been seen for an appointment in the last year OR does the patient have an upcoming appointment? Yes  Can we respond through MyChart? Yes  Agent: Please be advised that Rx refills may take up to 3 business days. We ask that you follow-up with your pharmacy. >> Oct 28, 2023 10:28 AM Rozanna MATSU wrote: Pt is asking for a 90 day supply on refill if possible  >> Oct 28, 2023 10:27 AM Rozanna G wrote: Pt is asking for 90 day supply if possible

## 2023-10-28 NOTE — Telephone Encounter (Signed)
 Copied from CRM [id number]. Topic: Clinical - Medication Refill >> Oct 28, 2023 10:22 AM Rozanna MATSU wrote: Medication: albuterol  (VENTOLIN  HFA) 108 (90 Base) MCG/ACT inhaler  Has the patient contacted their pharmacy? Yes (Agent: If no, request that the patient contact the pharmacy for the refill. If patient does not wish to contact the pharmacy document the reason why and proceed with request.) (Agent: If yes, when and what did the pharmacy advise?)  This is the patient's preferred pharmacy:  [facility] STORE #[id number] [place], [place] - [address] AT [facility] [address] [place] [address] Phone: [phone] Fax: [phone]  Is this the correct pharmacy for this prescription? Yes If no, delete pharmacy and type the correct one.   Has the prescription been filled recently? No  Is the patient out of the medication? No  Has the patient been seen for an appointment in the last year OR does the patient have an upcoming appointment? Yes  Can we respond through MyChart? Yes  Agent: Please be advised that Rx refills may take up to 3 business days. We ask that you follow-up with your pharmacy.

## 2023-10-28 NOTE — Progress Notes (Signed)
 Specialty Pharmacy Refill Coordination Note  MyChart Questionnaire Submission  James Barker is a 73 y.o. male contacted today regarding refills of specialty medication(s) Dupixent   Doses on hand: (Patient-Rptd) One For 11/06/23  Injection date: 11/20/23  Patient requested: (Patient-Rptd) Delivery   Delivery date: 11/14/23  Verified address: [address] [place] [address]  Medication will be filled on 11/13/23.

## 2023-10-31 ENCOUNTER — Other Ambulatory Visit: Payer: Self-pay | Admitting: Internal Medicine

## 2023-10-31 MED ORDER — APIXABAN 5 MG PO TABS: 5.0000 mg | ORAL_TABLET | Freq: Two times a day (BID) | ORAL | 2 refills | Status: DC

## 2023-10-31 NOTE — Telephone Encounter (Signed)
 Copied from CRM [id number]. Topic: Clinical - Medication Refill >> Oct 31, 2023 10:02 AM Whitney O wrote: Medication: apixaban  (ELIQUIS ) 5 MG TABS tablet  Has the patient contacted their pharmacy? Yes pharmacy told patient to call doctor  (Agent: If no, request that the patient contact the pharmacy for the refill. If patient does not wish to contact the pharmacy document the reason why and proceed with request.) (Agent: If yes, when and what did the pharmacy advise?)  This is the patient's preferred pharmacy:  [facility] STORE #[id number] [place], [place] - [address] AT [facility] [address] [place] [address] Phone: [phone] Fax: [phone]  Is this the correct pharmacy for this prescription? Yes If no, delete pharmacy and type the correct one.  [id number] - [place], [place] - [address] AT [facility] [address] [place] [address] Phone: [phone] Fax: [phone]   Has the prescription been filled recently? No  Is the patient out of the medication? No few more days   Has the patient been seen for an appointment in the last year OR does the patient have an upcoming appointment? Yes  Can we respond through MyChart? Yes  Agent: Please be advised that Rx refills may take up to 3 business days. We ask that you follow-up with your pharmacy.

## 2023-11-10 DIAGNOSIS — I10 Essential (primary) hypertension: Secondary | ICD-10-CM | POA: Diagnosis not present

## 2023-11-10 DIAGNOSIS — J418 Mixed simple and mucopurulent chronic bronchitis: Secondary | ICD-10-CM | POA: Diagnosis not present

## 2023-11-10 DIAGNOSIS — J449 Chronic obstructive pulmonary disease, unspecified: Secondary | ICD-10-CM | POA: Diagnosis not present

## 2023-11-10 DIAGNOSIS — N1831 Chronic kidney disease, stage 3a: Secondary | ICD-10-CM | POA: Diagnosis not present

## 2023-11-19 ENCOUNTER — Encounter (HOSPITAL_COMMUNITY): Payer: Self-pay | Admitting: Gastroenterology

## 2023-11-21 DIAGNOSIS — J418 Mixed simple and mucopurulent chronic bronchitis: Secondary | ICD-10-CM | POA: Diagnosis not present

## 2023-11-21 DIAGNOSIS — E7800 Pure hypercholesterolemia, unspecified: Secondary | ICD-10-CM | POA: Diagnosis not present

## 2023-11-21 DIAGNOSIS — N1831 Chronic kidney disease, stage 3a: Secondary | ICD-10-CM | POA: Diagnosis not present

## 2023-11-21 DIAGNOSIS — J449 Chronic obstructive pulmonary disease, unspecified: Secondary | ICD-10-CM | POA: Diagnosis not present

## 2023-11-21 DIAGNOSIS — I10 Essential (primary) hypertension: Secondary | ICD-10-CM | POA: Diagnosis not present

## 2023-11-25 ENCOUNTER — Encounter (HOSPITAL_COMMUNITY)
Admission: RE | Disposition: A | Discharge status: Home / Self Care | Payer: Self-pay | Source: Home / Self Care | Attending: Gastroenterology

## 2023-11-25 ENCOUNTER — Encounter (HOSPITAL_COMMUNITY): Payer: Self-pay | Admitting: Gastroenterology

## 2023-11-25 ENCOUNTER — Other Ambulatory Visit: Payer: Self-pay

## 2023-11-25 ENCOUNTER — Ambulatory Visit (HOSPITAL_COMMUNITY)
Admission: RE | Admit: 2023-11-25 | Discharge: 2023-11-25 | Disposition: A | Discharge status: Home / Self Care | Attending: Gastroenterology | Admitting: Gastroenterology

## 2023-11-25 ENCOUNTER — Ambulatory Visit (HOSPITAL_COMMUNITY): Admitting: Anesthesiology

## 2023-11-25 DIAGNOSIS — D122 Benign neoplasm of ascending colon: Secondary | ICD-10-CM | POA: Diagnosis not present

## 2023-11-25 DIAGNOSIS — I129 Hypertensive chronic kidney disease with stage 1 through stage 4 chronic kidney disease, or unspecified chronic kidney disease: Secondary | ICD-10-CM | POA: Diagnosis not present

## 2023-11-25 DIAGNOSIS — G4730 Sleep apnea, unspecified: Secondary | ICD-10-CM | POA: Insufficient documentation

## 2023-11-25 DIAGNOSIS — K31A11 Gastric intestinal metaplasia without dysplasia, involving the antrum: Secondary | ICD-10-CM | POA: Diagnosis not present

## 2023-11-25 DIAGNOSIS — Z86711 Personal history of pulmonary embolism: Secondary | ICD-10-CM | POA: Insufficient documentation

## 2023-11-25 DIAGNOSIS — J4489 Other specified chronic obstructive pulmonary disease: Secondary | ICD-10-CM | POA: Diagnosis not present

## 2023-11-25 DIAGNOSIS — N1831 Chronic kidney disease, stage 3a: Secondary | ICD-10-CM | POA: Diagnosis not present

## 2023-11-25 DIAGNOSIS — R1310 Dysphagia, unspecified: Secondary | ICD-10-CM | POA: Diagnosis present

## 2023-11-25 DIAGNOSIS — K222 Esophageal obstruction: Secondary | ICD-10-CM | POA: Diagnosis not present

## 2023-11-25 DIAGNOSIS — D123 Benign neoplasm of transverse colon: Secondary | ICD-10-CM | POA: Diagnosis not present

## 2023-11-25 DIAGNOSIS — K219 Gastro-esophageal reflux disease without esophagitis: Secondary | ICD-10-CM | POA: Insufficient documentation

## 2023-11-25 DIAGNOSIS — K2950 Unspecified chronic gastritis without bleeding: Secondary | ICD-10-CM | POA: Insufficient documentation

## 2023-11-25 DIAGNOSIS — Z8551 Personal history of malignant neoplasm of bladder: Secondary | ICD-10-CM | POA: Diagnosis not present

## 2023-11-25 DIAGNOSIS — Z87891 Personal history of nicotine dependence: Secondary | ICD-10-CM | POA: Insufficient documentation

## 2023-11-25 DIAGNOSIS — I10 Essential (primary) hypertension: Secondary | ICD-10-CM | POA: Diagnosis not present

## 2023-11-25 DIAGNOSIS — K449 Diaphragmatic hernia without obstruction or gangrene: Secondary | ICD-10-CM | POA: Diagnosis not present

## 2023-11-25 HISTORY — PX: COLONOSCOPY: SHX5424

## 2023-11-25 HISTORY — PX: ESOPHAGOGASTRODUODENOSCOPY: SHX5428

## 2023-11-25 SURGERY — COLONOSCOPY
Anesthesia: Monitor Anesthesia Care

## 2023-11-25 MED ORDER — LIDOCAINE 2% (20 MG/ML) 5 ML SYRINGE
INTRAMUSCULAR | Status: DC | PRN
  Administered 2023-11-25: 50 mg via INTRAVENOUS

## 2023-11-25 MED ORDER — PROPOFOL 10 MG/ML IV BOLUS
INTRAVENOUS | Status: AC
  Filled 2023-11-25: qty 20

## 2023-11-25 MED ORDER — ALBUTEROL SULFATE (2.5 MG/3ML) 0.083% IN NEBU
2.5000 mg | INHALATION_SOLUTION | Freq: Once | RESPIRATORY_TRACT | Status: AC
  Administered 2023-11-25: 2.5 mg via RESPIRATORY_TRACT

## 2023-11-25 MED ORDER — GLYCOPYRROLATE PF 0.2 MG/ML IJ SOSY
PREFILLED_SYRINGE | INTRAMUSCULAR | Status: DC | PRN
  Administered 2023-11-25: .1 mg via INTRAVENOUS

## 2023-11-25 MED ORDER — PROPOFOL 500 MG/50ML IV EMUL
INTRAVENOUS | Status: DC | PRN
  Administered 2023-11-25 (×3): 50 mg via INTRAVENOUS
  Administered 2023-11-25: 40 mg via INTRAVENOUS
  Administered 2023-11-25: 30 mg via INTRAVENOUS
  Administered 2023-11-25: 90 mg via INTRAVENOUS
  Administered 2023-11-25: 30 mg via INTRAVENOUS
  Administered 2023-11-25: 160 mg via INTRAVENOUS

## 2023-11-25 MED ORDER — IPRATROPIUM-ALBUTEROL 0.5-2.5 (3) MG/3ML IN SOLN
RESPIRATORY_TRACT | Status: AC
  Filled 2023-11-25: qty 3

## 2023-11-25 MED ORDER — SODIUM CHLORIDE 0.9 % IV SOLN
INTRAVENOUS | Status: DC
Start: 2023-11-25 — End: 2023-11-25

## 2023-11-25 MED ORDER — ALBUTEROL SULFATE (2.5 MG/3ML) 0.083% IN NEBU
INHALATION_SOLUTION | RESPIRATORY_TRACT | Status: AC
  Filled 2023-11-25: qty 3

## 2023-11-25 NOTE — Anesthesia Procedure Notes (Signed)
 Procedure Name: MAC Date/Time: 11/25/2023 8:09 AM  Performed by: Erick Fitz, CRNAPre-anesthesia Checklist: Patient identified, Emergency Drugs available, Suction available, Patient being monitored and Timeout performed Patient Re-evaluated:Patient Re-evaluated prior to induction Oxygen Delivery Method: Simple face mask Preoxygenation: Pre-oxygenation with 100% oxygen (POM masked utilized) Induction Type: IV induction Placement Confirmation: CO2 detector and positive ETCO2 Dental Injury: Teeth and Oropharynx as per pre-operative assessment

## 2023-11-25 NOTE — Anesthesia Postprocedure Evaluation (Signed)
 Anesthesia Post Note  Patient: James Barker  Procedure(s) Performed: COLONOSCOPY EGD (ESOPHAGOGASTRODUODENOSCOPY)     Patient location during evaluation: Endoscopy Anesthesia Type: MAC Level of consciousness: awake and alert, oriented and patient cooperative Pain management: pain level controlled Vital Signs Assessment: post-procedure vital signs reviewed and stable Respiratory status: spontaneous breathing, nonlabored ventilation and respiratory function stable Cardiovascular status: blood pressure returned to baseline and stable Postop Assessment: no apparent nausea or vomiting Anesthetic complications: no   There were no known notable events for this encounter.  Last Vitals:  Vitals:   11/25/23 0850 11/25/23 0900  BP: (!) 135/94 115/69  Resp: 18 (!) 21  Temp: (!) 36.3 C   SpO2: 96% 96%    Last Pain:  Vitals:   11/25/23 0900  TempSrc:   PainSc: 0-No pain                 Micaela Stith,E. Bellamy Judson

## 2023-11-25 NOTE — Transfer of Care (Signed)
 Immediate Anesthesia Transfer of Care Note  Patient: James Barker  Procedure(s) Performed: COLONOSCOPY EGD (ESOPHAGOGASTRODUODENOSCOPY)  Patient Location: Endoscopy Unit  Anesthesia Type:MAC  Level of Consciousness: awake, alert , oriented, and patient cooperative  Airway & Oxygen Therapy: Patient Spontanous Breathing and Patient connected to face mask oxygen  Post-op Assessment: Report given to RN and Post -op Vital signs reviewed and stable  Post vital signs: Reviewed and stable  Last Vitals:  Vitals Value Taken Time  BP 135/94 11/25/23 08:50  Temp 36.3 C 11/25/23 08:50  Pulse    Resp 18 11/25/23 08:50  SpO2 96 % 11/25/23 08:50    Last Pain:  Vitals:   11/25/23 0850  TempSrc: Temporal  PainSc: 0-No pain      Patients Stated Pain Goal: 0 (11/25/23 0850)  Complications: There were no known notable events for this encounter.

## 2023-11-25 NOTE — Op Note (Signed)
 [facility] Patient Name: James Barker Procedure Date: 11/25/2023 MRN: [id number] Attending MD: Layla Lah , MD, [id number] Date of Birth: October 31, 1950 CSN: [id number] Age: 73 Admit Type: Outpatient Procedure:                Colonoscopy Indications:              Iron deficiency anemia Providers:                Layla Lah, MD, Olam Riedel, RN, Janie Billups,                            Technician Referring MD:              Medicines:                Sedation Administered by an Anesthesia Professional Complications:            No immediate complications. Estimated Blood Loss:     Estimated blood loss was minimal. Procedure:                Pre-Anesthesia Assessment:                           - Prior to the procedure, a History and Physical                            was performed, and patient medications and                            allergies were reviewed. The patient's tolerance of                            previous anesthesia was also reviewed. The risks                            and benefits of the procedure and the sedation                            options and risks were discussed with the patient.                            All questions were answered, and informed consent                            was obtained. Prior Anticoagulants: The patient has                            taken Eliquis  (apixaban ), last dose was 2 days                            prior to procedure. ASA Grade Assessment: III - A                            patient with severe systemic disease. After  reviewing the risks and benefits, the patient was                            deemed in satisfactory condition to undergo the                            procedure.                           After obtaining informed consent, the colonoscope                            was passed under direct vision. Throughout the                            procedure, the  patient's blood pressure, pulse, and                            oxygen saturations were monitored continuously. The                            [id number]-[id number] ([id number] Olympus Colonoscope was                            introduced through the anus and advanced to the the                            terminal ileum, with identification of the                            appendiceal orifice and IC valve. The colonoscopy                            was performed without difficulty. The patient                            tolerated the procedure well. The quality of the                            bowel preparation was good. The terminal ileum,                            ileocecal valve, appendiceal orifice, and rectum                            were photographed. Scope In: 8:27:21 AM Scope Out: 8:44:30 AM Scope Withdrawal Time: 0 hours 13 minutes 48 seconds  Total Procedure Duration: 0 hours 17 minutes 9 seconds  Findings:      Hemorrhoids were found on perianal exam.      The terminal ileum appeared normal.      Four sessile polyps were found in the ascending colon. The polyps were 3       to 8 mm in size. These polyps were removed with a cold snare. Resection       and retrieval were  complete.      Three sessile polyps were found in the descending colon and transverse       colon. The polyps were 4 to 6 mm in size. These polyps were removed with       a cold snare. Resection and retrieval were complete.      Internal hemorrhoids were found during retroflexion. The hemorrhoids       were small. Impression:               - Hemorrhoids found on perianal exam.                           - The examined portion of the ileum was normal.                           - Four 3 to 8 mm polyps in the ascending colon,                            removed with a cold snare. Resected and retrieved.                           - Three 4 to 6 mm polyps in the descending colon                            and in the  transverse colon, removed with a cold                            snare. Resected and retrieved.                           - Internal hemorrhoids. Moderate Sedation:      Moderate (conscious) sedation was personally administered by an       anesthesia professional. The following parameters were monitored: oxygen       saturation, heart rate, blood pressure, and response to care. Recommendation:           - Patient has a contact number available for                            emergencies. The signs and symptoms of potential                            delayed complications were discussed with the                            patient. Return to normal activities tomorrow.                            Written discharge instructions were provided to the                            patient.                           - Resume previous diet.                           -  Continue present medications.                           - Await pathology results.                           - Repeat colonoscopy date to be determined after                            pending pathology results are reviewed for                            surveillance of multiple polyps.                           - Resume Eliquis  (apixaban ) at prior dose tomorrow. Procedure Code(s):        --- Professional ---                           [id number], Colonoscopy, flexible; with removal of                            tumor(s), polyp(s), or other lesion(s) by snare                            technique Diagnosis Code(s):        --- Professional ---                           K64.8, Other hemorrhoids                           D12.2, Benign neoplasm of ascending colon                           D12.4, Benign neoplasm of descending colon                           D12.3, Benign neoplasm of transverse colon (hepatic                            flexure or splenic flexure)                           D50.9, Iron deficiency anemia, unspecified CPT copyright  2022 American Medical Association. All rights reserved. The codes documented in this report are preliminary and upon coder review may  be revised to meet current compliance requirements. Layla Lah, MD Layla Lah, MD 11/25/2023 8:59:11 AM Number of Addenda: 0

## 2023-11-25 NOTE — Anesthesia Preprocedure Evaluation (Addendum)
 Anesthesia Evaluation  Patient identified by MRN, date of birth, ID band Patient awake    Reviewed: Allergy & Precautions, NPO status , Patient's Chart, lab work & pertinent test results  History of Anesthesia Complications Negative for: history of anesthetic complications  Airway Mallampati: I  TM Distance: >3 FB Neck ROM: Full    Dental  (+) Edentulous Upper, Edentulous Lower, Lower Dentures, Upper Dentures, Dental Advisory Given   Pulmonary asthma , sleep apnea (does not use CPAP yet) , COPD,  COPD inhaler, former smoker, PE (2023, IVC filter)   breath sounds clear to auscultation       Cardiovascular hypertension, Pt. on medications (-) angina + DOE   Rhythm:Regular Rate:Normal  07/2023 ECHO: EF 60 to 65%.  1. The LV has normal function. The left ventricle has no regional  wall motion abnormalities. Left ventricular diastolic parameters were  normal. The average left ventricular  global longitudinal strain is -18.4 %. The global longitudinal strain is  normal.   2. Right ventricular systolic function is normal. The right ventricular  size is normal. Tricuspid regurgitation signal is inadequate for assessing  PA pressure.   3. The mitral valve is normal in structure. Trivial mitral valve  regurgitation. No evidence of mitral stenosis.   4. The aortic valve is tricuspid. There is mild calcification of the  aortic valve. Aortic valve regurgitation is not visualized. No aortic  stenosis is present.     Neuro/Psych    GI/Hepatic ,GERD  Medicated and Controlled,,(+) Hepatitis -  Endo/Other  negative endocrine ROS    Renal/GU Renal InsufficiencyRenal disease     Musculoskeletal   Abdominal   Peds  Hematology Eliquis : last 11/22/2023   Anesthesia Other Findings H/o bladder cancer  Reproductive/Obstetrics                              Anesthesia Physical Anesthesia Plan  ASA:  3  Anesthesia Plan: MAC   Post-op Pain Management: Minimal or no pain anticipated   Induction:   PONV Risk Score and Plan: 1 and Treatment may vary due to age or medical condition  Airway Management Planned: Natural Airway and Simple Face Mask  Additional Equipment: None  Intra-op Plan:   Post-operative Plan: Extubation in OR  Informed Consent: I have reviewed the patients History and Physical, chart, labs and discussed the procedure including the risks, benefits and alternatives for the proposed anesthesia with the patient or authorized representative who has indicated his/her understanding and acceptance.     Dental advisory given  Plan Discussed with: CRNA and Surgeon  Anesthesia Plan Comments:         Anesthesia Quick Evaluation

## 2023-11-25 NOTE — H&P (Signed)
 Primary Care Physician:  Wonda Worth SQUIBB, PA Primary Gastroenterologist: [facility]  Reason for visit -outpatient EGD and colonoscopy  HPI: James Barker is a 73 y.o. male with multiple past medical history mentioned below including history of PE was on Eliquis , history of bladder cancer, history of hepatitis C status posttreatment, history of latent tuberculosis here for outpatient EGD with possible dilation and colonoscopy.  Patient with intermittent dysphagia to both solids and liquids.  Sensation of coughing and choking as well as sensation of food stuck in upper esophagus.  Denies seeing any blood in the stool or black stool.  Patient with intermittent anemia in the Plast with hemoglobin range of 11-12 back in 2023 and 2024.  Hemoglobin is normal now.    Past Medical History:  Diagnosis Date   Anemia    Arthritis    Asthma-COPD overlap syndrome [facility])    pulmonology--- dr n desai;   last exacerbation 05-08-2021   Benign localized prostatic hyperplasia with lower urinary tract symptoms (LUTS)    Chronic cough    due to copd   Chronic kidney disease    Dairy product intolerance    DOE (dyspnea on exertion)    d/t asthma/ COPD   Dyspnea    Emphysema, unspecified (HCC)    Full dentures    History of bladder cancer 2020   s/p TURBT w/ chemo instillation   History of COVID-19    per pt 2021 (no at same time of pneumonia)  w/ moderate symptoms , recovered at home, that resolved back to baseline   History of hepatitis C 2018   hx chronic hepatitis C,  per pt treated and told was cured   History of latent tuberculosis 2017   pt tested positive for TB exposure, not active, treated w/ INH for 9 months   History of MAI infection 2021   never treated   History of pulmonary embolism    (05-09-2021  pt stated never had clot prior to 2017 , none since 2020, had negative blood disorder work-up)//  2017  treated w/ xarelto 6 months//  and recurrent 04/ 2020 in setting severe UTI and  deconditioning w/ prolonged admission due to intolerant to anticoagulants IVC filter inserted   Hypertension    OSA (obstructive sleep apnea)    per pt dx yrs ago, intolerant to cpap   Pneumonia    Pulmonary eosinophilia    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   Weak urinary stream    Wears glasses     Past Surgical History:  Procedure Laterality Date   ANAL FISTULECTOMY     x5  last one 2008   BRONCHOSCOPY  2021   per pt had pneumonia   (done in [place] )   INGUINAL HERNIA REPAIR Right 2010   IR IVC FILTER RETRIEVAL / S&I /IMG GUID/MOD SED  08/29/2023   IVC FILTER INSERTION  2020   in [place]   ORIF WRIST FRACTURE Left 2016   SHOULDER ARTHROSCOPY W/ ROTATOR CUFF REPAIR Left 2016   SHOULDER ARTHROSCOPY WITH ROTATOR CUFF REPAIR AND SUBACROMIAL DECOMPRESSION Left 05/09/2022   Procedure: SHOULDER ARTHROSCOPY WITH ROTATOR CUFF REPAIR AND SUBACROMIAL DECOMPRESSION;  Surgeon: Dozier Soulier, MD;  Location: [facility];  Service: Orthopedics;  Laterality: Left;   TRANSURETHRAL RESECTION OF BLADDER TUMOR  2020   in [place]   TRANSURETHRAL RESECTION OF PROSTATE  2021   in [place]   TRANSURETHRAL RESECTION OF PROSTATE N/A 05/11/2021   Procedure: PROSTATE EXAM UNDER ANESTHESIA;  TRANSURETHRAL RESECTION OF THE PROSTATE (TURP), BLADDER BIOPSY;  Surgeon: Nieves Cough, MD;  Location: [facility];  Service: Urology;  Laterality: N/A;   UMBILICAL HERNIA REPAIR  2011    Prior to Admission medications   Medication Sig Start Date End Date Taking? Authorizing Provider  albuterol  (PROVENTIL ) (2.5 MG/3ML) 0.083% nebulizer solution Take 3 mLs (2.5 mg total) by nebulization every 6 (six) hours as needed for wheezing or shortness of breath. 04/26/21  Yes Cobb, Comer GAILS, NP  albuterol  (VENTOLIN  HFA) 108 (90 Base) MCG/ACT inhaler Inhale 2 puffs into the lungs every 6 (six) hours as needed for wheezing or shortness of breath. 10/28/23  Yes Meade Verdon RAMAN, MD  amLODipine  (NORVASC ) 5 MG tablet Take 1 tablet (5 mg total)  by mouth daily. 05/30/23  Yes Drusilla Sabas RAMAN, MD  atorvastatin  (LIPITOR) 20 MG tablet Take 20 mg by mouth daily. 12/07/20  Yes [provider]  fluticasone -salmeterol (WIXELA INHUB ) 250-50 MCG/ACT AEPB Inhale 1 puff into the lungs in the morning and at bedtime. 05/01/23  Yes Desai, Nikita S, MD  lisinopril (ZESTRIL) 10 MG tablet Take 10 mg by mouth daily.   Yes [provider]  Tiotropium Bromide  Monohydrate (SPIRIVA  RESPIMAT) 2.5 MCG/ACT AERS Inhale 2 puffs into the lungs daily. 01/29/23  Yes Meade Verdon RAMAN, MD  apixaban  (ELIQUIS ) 5 MG TABS tablet Take 1 tablet (5 mg total) by mouth 2 (two) times daily. 10/31/23   Meade Verdon RAMAN, MD  benzonatate  (TESSALON ) 100 MG capsule Take 1 capsule (100 mg total) by mouth 3 (three) times daily as needed for cough. Patient not taking: No sig reported 05/30/23   Drusilla Sabas RAMAN, MD  Dupilumab  (DUPIXENT ) 300 MG/2ML SOAJ Inject 300 mg into the skin every 14 days. 10/27/23   Desai, Nikita S, MD  omeprazole (PRILOSEC) 40 MG capsule Take 40 mg by mouth daily.    [provider]  ondansetron  (ZOFRAN -ODT) 4 MG disintegrating tablet 1 tablet on the tongue and allow to dissolve Orally Once a day as needed; Duration: 30 days    [provider]  pantoprazole  (PROTONIX ) 40 MG tablet Take 1 tablet (40 mg total) by mouth daily at 12 noon. Patient not taking: No sig reported 05/31/23   Drusilla Sabas RAMAN, MD    Scheduled Meds: Continuous Infusions:  sodium chloride  20 mL/hr at 11/25/23 0737   PRN Meds:.  Allergies as of 09/29/2023 - Review Complete 09/08/2023  Allergen Reaction Noted   Cheese Nausea And Vomiting 05/17/2021   Lactose intolerance (gi) Diarrhea and Nausea And Vomiting 05/17/2021    Family History  Problem Relation Age of Onset   Pulmonary embolism Mother    Emphysema Father    Lung cancer Sister    Asthma Child    Asthma Child     Social History   Socioeconomic History   Marital status: Divorced    Spouse name: Not on  file   Number of children: Not on file   Years of education: Not on file   Highest education level: Not on file  Occupational History   Not on file  Tobacco Use   Smoking status: Former    Current packs/day: 0.00    Average packs/day: 1 pack/day for 20.0 years (20.0 ttl pk-yrs)    Types: Cigarettes    Start date: 76    Quit date: 87    Years since quitting: 33.8   Smokeless tobacco: Never  Vaping Use   Vaping status: Never Used  Substance  and Sexual Activity   Alcohol use: Not Currently   Drug use: Yes    Types: Marijuana    Comment: 05-09-2021  per pt smokes occasional   Sexual activity: Not on file  Other Topics Concern   Not on file  Social History Narrative   Not on file   Social Drivers of Health   Financial Resource Strain: Not on file  Food Insecurity: No Food Insecurity (05/29/2023)   Hunger Vital Sign    Worried About Running Out of Food in the Last Year: Never true    Ran Out of Food in the Last Year: Never true  Transportation Needs: No Transportation Needs (05/29/2023)   PRAPARE - Administrator, Civil Service (Medical): No    Lack of Transportation (Non-Medical): No  Physical Activity: Not on file  Stress: Not on file  Social Connections: Moderately Isolated (05/29/2023)   Social Connection and Isolation Panel    Frequency of Communication with Friends and Family: More than three times a week    Frequency of Social Gatherings with Friends and Family: More than three times a week    Attends Religious Services: Never    Database Administrator or Organizations: No    Attends Banker Meetings: Never    Marital Status: Living with partner  Intimate Partner Violence: Not At Risk (05/29/2023)   Humiliation, Afraid, Rape, and Kick questionnaire    Fear of Current or Ex-Partner: No    Emotionally Abused: No    Physically Abused: No    Sexually Abused: No    Review of Systems: All negative except as stated above in HPI.  Physical  Exam: Vital signs: Vitals:   11/25/23 0714  BP: 133/76  Resp: 12  Temp: 98.1 F (36.7 C)  SpO2: 97%     General:   Alert,  Well-developed, well-nourished, pleasant and cooperative in NAD Lungs: No visible respiratory distress Heart:  Regular rate and rhythm; no murmurs, clicks, rubs,  or gallops. Abdomen: Soft, nontender, nondistended, bowel sound present, no peritoneal signs Rectal:  Deferred  GI:  Lab Results: No results for input(s): WBC, HGB, HCT, PLT in the last 72 hours. BMET No results for input(s): NA, K, CL, CO2, GLUCOSE, BUN, CREATININE, CALCIUM  in the last 72 hours. LFT No results for input(s): PROT, ALBUMIN, AST, ALT, ALKPHOS, BILITOT, BILIDIR, IBILI in the last 72 hours. PT/INR No results for input(s): LABPROT, INR in the last 72 hours.   Studies/Results: No results found.  Impression/Plan: -Pharyngoesophageal dysphagia  - Chronic anemia - History of PE.  Eliquis  on hold for 2 days.  Recommendations -------------------------- - Proceed with EGD with possible dilation and colonoscopy today.  Risks (bleeding, infection, bowel perforation that could require surgery, sedation-related changes in cardiopulmonary systems), benefits (identification and possible treatment of source of symptoms, exclusion of certain causes of symptoms), and alternatives (watchful waiting, radiographic imaging studies, empiric medical treatment)  were explained to patient/family in detail and patient wishes to proceed.     LOS: 0 days   Layla Lah  MD, FACP 11/25/2023, 7:52 AM  Contact #  [phone]

## 2023-11-25 NOTE — Op Note (Signed)
 [facility] Patient Name: James Barker Procedure Date: 11/25/2023 MRN: [id number] Attending MD: Layla Lah , MD, [id number] Date of Birth: [birth year] CSN: [id number] Age: 73 Admit Type: Outpatient Procedure:                Upper GI endoscopy Indications:              Dysphagia Providers:                Layla Lah, MD, Olam Riedel, RN, Corky Czech,                            Technician Referring MD:              Medicines:                Sedation Administered by an Anesthesia Professional Complications:            No immediate complications. Estimated Blood Loss:     Estimated blood loss was minimal. Procedure:                Pre-Anesthesia Assessment:                           - Prior to the procedure, a History and Physical                            was performed, and patient medications and                            allergies were reviewed. The patient's tolerance of                            previous anesthesia was also reviewed. The risks                            and benefits of the procedure and the sedation                            options and risks were discussed with the patient.                            All questions were answered, and informed consent                            was obtained. Prior Anticoagulants: The patient has                            taken Eliquis  (apixaban ), last dose was 2 days                            prior to procedure. ASA Grade Assessment: III - A                            patient with severe systemic disease. After  reviewing the risks and benefits, the patient was                            deemed in satisfactory condition to undergo the                            procedure.                           After obtaining informed consent, the endoscope was                            passed under direct vision. Throughout the                            procedure, the patient's blood  pressure, pulse, and                            oxygen saturations were monitored continuously. The                            [id number] ([id number] Olympus endoscope was introduced                            through the mouth, and advanced to the second part                            of duodenum. The upper GI endoscopy was                            accomplished without difficulty. The patient                            tolerated the procedure well. Scope In: Scope Out: Findings:      A non-obstructing Schatzki ring was found at the gastroesophageal       junction. A TTS dilator was passed through the scope. Dilation with a       15-16.5-18 mm balloon dilator was performed to 18 mm. The dilation site       was examined following endoscope reinsertion and showed no bleeding,       mucosal tear or perforation.      Patchy moderate mucosal changes characterized by congestion, nodularity       and altered texture were found in the entire esophagus. Biopsies were       taken with a cold forceps for histology.      A medium-sized hiatal hernia was present.      Scattered mild inflammation characterized by congestion (edema),       erosions and erythema was found in the prepyloric region of the stomach.       Biopsies were taken with a cold forceps for histology.      The cardia and gastric fundus were normal on retroflexion.      The duodenal bulb, first portion of the duodenum and second portion of       the duodenum were normal. Impression:               -  Non-obstructing Schatzki ring. Dilated.                           - Congested, nodular, texture changed mucosa in the                            esophagus. Biopsied.                           - Medium-sized hiatal hernia.                           - Gastritis. Biopsied.                           - Normal duodenal bulb, first portion of the                            duodenum and second portion of the duodenum. Moderate Sedation:       Moderate (conscious) sedation was personally administered by an       anesthesia professional. The following parameters were monitored: oxygen       saturation, heart rate, blood pressure, and response to care. Recommendation:           - Perform a colonoscopy today.                           - Use Prilosec (omeprazole) 20 mg PO daily for 2                            months. Procedure Code(s):        --- Professional ---                           [phone], Esophagogastroduodenoscopy, flexible,                            transoral; with transendoscopic balloon dilation of                            esophagus (less than 30 mm diameter)                           43239, 59, Esophagogastroduodenoscopy, flexible,                            transoral; with biopsy, single or multiple Diagnosis Code(s):        --- Professional ---                           K22.2, Esophageal obstruction                           K22.89, Other specified disease of esophagus                           K44.9, Diaphragmatic hernia without obstruction or  gangrene                           K29.70, Gastritis, unspecified, without bleeding                           R13.10, Dysphagia, unspecified CPT copyright 2022 American Medical Association. All rights reserved. The codes documented in this report are preliminary and upon coder review may  be revised to meet current compliance requirements. Layla Lah, MD Layla Lah, MD 11/25/2023 8:55:34 AM Number of Addenda: 0

## 2023-11-25 NOTE — Discharge Instructions (Signed)

## 2023-11-26 ENCOUNTER — Ambulatory Visit: Admitting: Internal Medicine

## 2023-11-26 ENCOUNTER — Encounter (HOSPITAL_COMMUNITY): Payer: Self-pay | Admitting: Gastroenterology

## 2023-11-26 LAB — SURGICAL PATHOLOGY

## 2023-11-28 ENCOUNTER — Encounter (HOSPITAL_BASED_OUTPATIENT_CLINIC_OR_DEPARTMENT_OTHER): Admitting: Internal Medicine

## 2023-12-04 ENCOUNTER — Other Ambulatory Visit: Payer: Self-pay

## 2023-12-05 ENCOUNTER — Encounter (INDEPENDENT_AMBULATORY_CARE_PROVIDER_SITE_OTHER): Payer: Self-pay

## 2023-12-05 ENCOUNTER — Other Ambulatory Visit (HOSPITAL_COMMUNITY): Payer: Self-pay

## 2023-12-05 ENCOUNTER — Other Ambulatory Visit: Payer: Self-pay

## 2023-12-05 NOTE — Progress Notes (Signed)
 Specialty Pharmacy Refill Coordination Note  MyChart Questionnaire Submission  James Barker is a 73 y.o. male contacted today regarding refills of specialty medication(s) Dupixent .  Doses on hand: (Patient-Rptd) 0   Injection date: (Patient-Rptd) 12/18/23  Patient requested: (Patient-Rptd) Delivery   Delivery date: 12/09/23  Verified address: [address] [place]  [address]  Medication will be filled on 12/08/23.

## 2023-12-08 ENCOUNTER — Other Ambulatory Visit: Payer: Self-pay

## 2024-01-02 ENCOUNTER — Encounter (INDEPENDENT_AMBULATORY_CARE_PROVIDER_SITE_OTHER): Payer: Self-pay

## 2024-01-03 ENCOUNTER — Other Ambulatory Visit: Payer: Self-pay | Admitting: Internal Medicine

## 2024-01-05 ENCOUNTER — Other Ambulatory Visit: Payer: Self-pay

## 2024-01-05 ENCOUNTER — Other Ambulatory Visit (HOSPITAL_COMMUNITY): Payer: Self-pay

## 2024-01-06 ENCOUNTER — Other Ambulatory Visit: Payer: Self-pay

## 2024-01-06 NOTE — Progress Notes (Signed)
 Specialty Pharmacy Refill Coordination Note  James Barker is a 73 y.o. male contacted today regarding refills of specialty medication(s) Dupilumab  (Dupixent )   Patient requested (Patient-Rptd) Delivery   Delivery date: 01/08/24   Verified address: (Patient-Rptd) [address] [place]   Medication will be filled on: 01/07/24

## 2024-01-07 ENCOUNTER — Other Ambulatory Visit: Payer: Self-pay

## 2024-01-08 ENCOUNTER — Other Ambulatory Visit (HOSPITAL_COMMUNITY): Payer: Self-pay

## 2024-01-08 ENCOUNTER — Other Ambulatory Visit: Payer: Self-pay

## 2024-01-08 NOTE — Progress Notes (Signed)
 Specialty Pharmacy Ongoing Clinical Assessment Note  James Barker is a 73 y.o. male who is being followed by the specialty pharmacy service for RxSp Asthma/COPD   Patient's specialty medication(s) reviewed today: Dupilumab  (Dupixent )   Missed doses in the last 4 weeks: 0   Patient/Caregiver did not have any additional questions or concerns.   Therapeutic benefit summary: Patient is achieving benefit   Adverse events/side effects summary: No adverse events/side effects   Patient's therapy is appropriate to: Continue    Goals Addressed             This Visit's Progress    Minimize recurrence of flares   On track    Patient is on track. Patient will maintain adherence. Patient states that he feels medication is working well for him.          Follow up: 12 months  [name] Specialty Pharmacist

## 2024-01-17 ENCOUNTER — Emergency Department (HOSPITAL_COMMUNITY)

## 2024-01-17 ENCOUNTER — Encounter (HOSPITAL_COMMUNITY): Payer: Self-pay

## 2024-01-17 ENCOUNTER — Other Ambulatory Visit: Payer: Self-pay

## 2024-01-17 ENCOUNTER — Other Ambulatory Visit: Payer: Self-pay | Admitting: Internal Medicine

## 2024-01-17 ENCOUNTER — Emergency Department (HOSPITAL_COMMUNITY)
Admission: EM | Admit: 2024-01-17 | Discharge: 2024-01-17 | Disposition: A | Discharge status: Home / Self Care | Attending: Emergency Medicine | Admitting: Emergency Medicine

## 2024-01-17 DIAGNOSIS — Z7951 Long term (current) use of inhaled steroids: Secondary | ICD-10-CM | POA: Diagnosis not present

## 2024-01-17 DIAGNOSIS — I129 Hypertensive chronic kidney disease with stage 1 through stage 4 chronic kidney disease, or unspecified chronic kidney disease: Secondary | ICD-10-CM | POA: Diagnosis not present

## 2024-01-17 DIAGNOSIS — J441 Chronic obstructive pulmonary disease with (acute) exacerbation: Secondary | ICD-10-CM | POA: Diagnosis not present

## 2024-01-17 DIAGNOSIS — Z8551 Personal history of malignant neoplasm of bladder: Secondary | ICD-10-CM | POA: Diagnosis not present

## 2024-01-17 DIAGNOSIS — Z7901 Long term (current) use of anticoagulants: Secondary | ICD-10-CM | POA: Diagnosis not present

## 2024-01-17 DIAGNOSIS — J4489 Other specified chronic obstructive pulmonary disease: Secondary | ICD-10-CM

## 2024-01-17 DIAGNOSIS — Z79899 Other long term (current) drug therapy: Secondary | ICD-10-CM | POA: Diagnosis not present

## 2024-01-17 DIAGNOSIS — N189 Chronic kidney disease, unspecified: Secondary | ICD-10-CM | POA: Insufficient documentation

## 2024-01-17 DIAGNOSIS — R059 Cough, unspecified: Secondary | ICD-10-CM | POA: Diagnosis present

## 2024-01-17 DIAGNOSIS — J45909 Unspecified asthma, uncomplicated: Secondary | ICD-10-CM | POA: Diagnosis not present

## 2024-01-17 LAB — RESP PANEL BY RT-PCR (RSV, FLU A&B, COVID)  RVPGX2
Influenza A by PCR: NEGATIVE
Influenza B by PCR: NEGATIVE
Resp Syncytial Virus by PCR: NEGATIVE
SARS Coronavirus 2 by RT PCR: NEGATIVE

## 2024-01-17 LAB — BLOOD GAS, VENOUS
Acid-base deficit: 0.2 mmol/L (ref 0.0–2.0)
Bicarbonate: 24.8 mmol/L (ref 20.0–28.0)
O2 Saturation: 67 %
Patient temperature: 37
pCO2, Ven: 41 mmHg — ABNORMAL LOW (ref 44–60)
pH, Ven: 7.39 (ref 7.25–7.43)
pO2, Ven: 37 mmHg (ref 32–45)

## 2024-01-17 LAB — CBC WITH DIFFERENTIAL/PLATELET
Abs Immature Granulocytes: 0.02 K/uL (ref 0.00–0.07)
Basophils Absolute: 0.1 K/uL (ref 0.0–0.1)
Basophils Relative: 1 %
Eosinophils Absolute: 0.1 K/uL (ref 0.0–0.5)
Eosinophils Relative: 1 %
HCT: 36.5 % — ABNORMAL LOW (ref 39.0–52.0)
Hemoglobin: 11.9 g/dL — ABNORMAL LOW (ref 13.0–17.0)
Immature Granulocytes: 0 %
Lymphocytes Relative: 17 %
Lymphs Abs: 1.6 K/uL (ref 0.7–4.0)
MCH: 29.6 pg (ref 26.0–34.0)
MCHC: 32.6 g/dL (ref 30.0–36.0)
MCV: 90.8 fL (ref 80.0–100.0)
Monocytes Absolute: 0.9 K/uL (ref 0.1–1.0)
Monocytes Relative: 9 %
Neutro Abs: 7.1 K/uL (ref 1.7–7.7)
Neutrophils Relative %: 72 %
Platelets: 181 K/uL (ref 150–400)
RBC: 4.02 MIL/uL — ABNORMAL LOW (ref 4.22–5.81)
RDW: 13.3 % (ref 11.5–15.5)
WBC: 9.8 K/uL (ref 4.0–10.5)
nRBC: 0 % (ref 0.0–0.2)

## 2024-01-17 LAB — BASIC METABOLIC PANEL WITH GFR
Anion gap: 13 (ref 5–15)
BUN: 25 mg/dL — ABNORMAL HIGH (ref 8–23)
CO2: 22 mmol/L (ref 22–32)
Calcium: 9.4 mg/dL (ref 8.9–10.3)
Chloride: 107 mmol/L (ref 98–111)
Creatinine, Ser: 1.37 mg/dL — ABNORMAL HIGH (ref 0.61–1.24)
GFR, Estimated: 54 mL/min — ABNORMAL LOW
Glucose, Bld: 112 mg/dL — ABNORMAL HIGH (ref 70–99)
Potassium: 3.8 mmol/L (ref 3.5–5.1)
Sodium: 141 mmol/L (ref 135–145)

## 2024-01-17 LAB — TROPONIN T, HIGH SENSITIVITY: Troponin T High Sensitivity: 17 ng/L (ref 0–19)

## 2024-01-17 LAB — D-DIMER, QUANTITATIVE: D-Dimer, Quant: 0.34 ug{FEU}/mL (ref 0.00–0.50)

## 2024-01-17 MED ORDER — IPRATROPIUM-ALBUTEROL 0.5-2.5 (3) MG/3ML IN SOLN
3.0000 mL | Freq: Once | RESPIRATORY_TRACT | Status: AC
  Administered 2024-01-17: 3 mL via RESPIRATORY_TRACT
  Filled 2024-01-17: qty 3

## 2024-01-17 MED ORDER — ASPIRIN 81 MG PO CHEW
324.0000 mg | CHEWABLE_TABLET | Freq: Once | ORAL | Status: AC
  Administered 2024-01-17: 324 mg via ORAL
  Filled 2024-01-17: qty 4

## 2024-01-17 MED ORDER — TIZANIDINE HCL 4 MG PO TABS
4.0000 mg | ORAL_TABLET | Freq: Once | ORAL | Status: AC
  Administered 2024-01-17: 4 mg via ORAL
  Filled 2024-01-17: qty 1

## 2024-01-17 MED ORDER — DOXYCYCLINE HYCLATE 100 MG PO TABS
100.0000 mg | ORAL_TABLET | Freq: Once | ORAL | Status: AC
  Administered 2024-01-17: 100 mg via ORAL
  Filled 2024-01-17: qty 1

## 2024-01-17 MED ORDER — LIDOCAINE 5 % EX PTCH
1.0000 | MEDICATED_PATCH | Freq: Once | CUTANEOUS | Status: DC
  Administered 2024-01-17: 1 via TRANSDERMAL
  Filled 2024-01-17: qty 1

## 2024-01-17 MED ORDER — METHYLPREDNISOLONE SODIUM SUCC 125 MG IJ SOLR
125.0000 mg | Freq: Once | INTRAMUSCULAR | Status: AC
  Administered 2024-01-17: 125 mg via INTRAVENOUS
  Filled 2024-01-17: qty 2

## 2024-01-17 MED ORDER — KETOROLAC TROMETHAMINE 15 MG/ML IJ SOLN
15.0000 mg | Freq: Once | INTRAMUSCULAR | Status: AC
  Administered 2024-01-17: 15 mg via INTRAVENOUS
  Filled 2024-01-17: qty 1

## 2024-01-17 MED ORDER — PREDNISONE 10 MG PO TABS: 40.0000 mg | ORAL_TABLET | Freq: Every day | ORAL | 0 refills | Status: AC

## 2024-01-17 MED ORDER — DOXYCYCLINE HYCLATE 100 MG PO CAPS: 100.0000 mg | ORAL_CAPSULE | Freq: Two times a day (BID) | ORAL | 0 refills | Status: AC

## 2024-01-17 NOTE — ED Triage Notes (Signed)
 BIB EMS from home for cough and sob since 0300 today. Pt had albuterol  and atrovent  with EMS and had some relief.

## 2024-01-17 NOTE — ED Notes (Signed)
 Xray called to obtain chest xray

## 2024-01-17 NOTE — Discharge Instructions (Signed)
 James Barker  Thank you for allowing us  to take care of you today.  You came to the Emergency Department today because you are having shortness of breath this morning.  You were wheezing significantly when you first got here, you are doing better after breathing treatments.  You did not have pneumonia, COVID, flu, RSV.  Your symptoms are most likely due to an asthma/COPD exacerbation.  We recommend doing scheduled DuoNebs at home every 6 hours for the next 2 days.  We are going to start you on doxycycline  and a steroid pack for your COPD exacerbation.  If you continue to breathe like you are currently you can stay home, however if you start feel like you are breathing worse or you have any worsening symptoms, please come to the emergency department for reevaluation.  To-Do: 1. Please follow-up with your primary doctor within 7 days / as soon as possible.   Please return to the Emergency Department or call 911 if you experience have worsening of your symptoms, or do not get better, new or different, chest pain, shortness of breath, severe or significantly worsening pain, high fever, severe confusion, pass out or have any reason to think that you need emergency medical care.   We hope you feel better soon.   Mitzie Later, MD Department of Emergency Medicine [facility]

## 2024-01-17 NOTE — ED Provider Notes (Signed)
 " [place] EMERGENCY DEPARTMENT AT [facility] Provider Note   CSN: [id number] Arrival date & time: 01/17/24  9182     History Chief Complaint  Patient presents with   Cough   Shortness of Breath    HPI: James Barker is a 73 y.o. male with history pertinent for asthma/COPD overlap, not on home oxygen, HTN, prior PE, CKD, bladder cancer in remission who presents complaining of multiple symptoms. Patient arrived via EMS from home.  History provided by patient.  No interpreter required during this encounter.  Patient reports that he was in his normal state of health yesterday.  Reports that at approximately 3 AM he acutely developed cough productive of thick, green sputum.  Reports that he has chest pain only when he coughs.  Reports that he has felt short of breath since about 3 AM when he acutely developed the cough.  Denies any fever, chills, vomiting, diarrhea, abdominal pain.  Reports that he does have nausea, believes that this started when he received the DuoNebs met with EMS.  Reports that he received 1 DuoNeb and route, did also take a albuterol  nebulizer prior to arrival at home.  Reports that he has been in remission from cancer for approximately 1 year.  Patient's recorded medical, surgical, social, medication list and allergies were reviewed in the Snapshot window as part of the initial history.   Prior to Admission medications  Medication Sig Start Date End Date Taking? Authorizing Provider  doxycycline  (VIBRAMYCIN ) 100 MG capsule Take 1 capsule (100 mg total) by mouth 2 (two) times daily. 01/17/24  Yes Rogelia Jerilynn RAMAN, MD  predniSONE  (DELTASONE ) 10 MG tablet Take 4 tablets (40 mg total) by mouth daily for 4 days. 01/17/24 01/21/24 Yes Rogelia Jerilynn RAMAN, MD  albuterol  (PROVENTIL ) (2.5 MG/3ML) 0.083% nebulizer solution Take 3 mLs (2.5 mg total) by nebulization every 6 (six) hours as needed for wheezing or shortness of breath. 04/26/21   Cobb, Comer GAILS, NP   albuterol  (VENTOLIN  HFA) 108 (90 Base) MCG/ACT inhaler Inhale 2 puffs into the lungs every 6 (six) hours as needed for wheezing or shortness of breath. 10/28/23   Meade Verdon RAMAN, MD  amLODipine  (NORVASC ) 5 MG tablet Take 1 tablet (5 mg total) by mouth daily. 05/30/23   Drusilla Sabas RAMAN, MD  apixaban  (ELIQUIS ) 5 MG TABS tablet TAKE 1 TABLET(5 MG) BY MOUTH TWICE DAILY 01/05/24   Meade Verdon RAMAN, MD  atorvastatin  (LIPITOR) 20 MG tablet Take 20 mg by mouth daily. 12/07/20   [provider]  benzonatate  (TESSALON ) 100 MG capsule Take 1 capsule (100 mg total) by mouth 3 (three) times daily as needed for cough. Patient not taking: No sig reported 05/30/23   Drusilla Sabas RAMAN, MD  Dupilumab  (DUPIXENT ) 300 MG/2ML SOAJ Inject 300 mg into the skin every 14 days. 10/27/23   Desai, Nikita S, MD  fluticasone -salmeterol (WIXELA INHUB ) 250-50 MCG/ACT AEPB Inhale 1 puff into the lungs in the morning and at bedtime. 05/01/23   Desai, Nikita S, MD  lisinopril (ZESTRIL) 10 MG tablet Take 10 mg by mouth daily.    [provider]  omeprazole (PRILOSEC) 40 MG capsule Take 40 mg by mouth daily.    [provider]  ondansetron  (ZOFRAN -ODT) 4 MG disintegrating tablet 1 tablet on the tongue and allow to dissolve Orally Once a day as needed; Duration: 30 days    [provider]  pantoprazole  (PROTONIX ) 40 MG tablet Take 1 tablet (40 mg total) by mouth  daily at 12 noon. Patient not taking: No sig reported 05/31/23   Drusilla Sabas RAMAN, MD  Tiotropium Bromide  Monohydrate (SPIRIVA  RESPIMAT) 2.5 MCG/ACT AERS Inhale 2 puffs into the lungs daily. 01/29/23   Meade Verdon RAMAN, MD     Allergies: Cheese and Lactose intolerance (gi)   Review of Systems   ROS as per HPI  Physical Exam Updated Vital Signs BP 100/61   Pulse (!) 58   Temp 98.2 F (36.8 C) (Oral)   Resp 18   Ht 5' 6.5 (1.689 m)   Wt 77.1 kg   SpO2 96%   BMI 27.03 kg/m  Physical Exam Vitals and nursing note reviewed.  Constitutional:       General: He is not in acute distress.    Appearance: Normal appearance.  HENT:     Head: Normocephalic and atraumatic.  Eyes:     Extraocular Movements: Extraocular movements intact.  Cardiovascular:     Rate and Rhythm: Normal rate.     Pulses: Normal pulses.  Pulmonary:     Effort: Pulmonary effort is normal.     Breath sounds: Wheezing (Posterior lung fields) present.  Skin:    General: Skin is warm and dry.     Capillary Refill: Capillary refill takes less than 2 seconds.  Neurological:     General: No focal deficit present.     Mental Status: He is alert and oriented to person, place, and time.     ED Course/ Medical Decision Making/ A&P    Procedures Procedures   Medications Ordered in ED Medications  lidocaine  (LIDODERM ) 5 % 1 patch (1 patch Transdermal Patch Applied 01/17/24 1014)  ipratropium-albuterol  (DUONEB) 0.5-2.5 (3) MG/3ML nebulizer solution 3 mL (3 mLs Nebulization Given 01/17/24 0851)    And  ipratropium-albuterol  (DUONEB) 0.5-2.5 (3) MG/3ML nebulizer solution 3 mL (3 mLs Nebulization Given 01/17/24 0851)    And  ipratropium-albuterol  (DUONEB) 0.5-2.5 (3) MG/3ML nebulizer solution 3 mL (3 mLs Nebulization Given 01/17/24 0842)  aspirin  chewable tablet 324 mg (324 mg Oral Given 01/17/24 0842)  methylPREDNISolone  sodium succinate (SOLU-MEDROL ) 125 mg/2 mL injection 125 mg (125 mg Intravenous Given 01/17/24 0850)  ketorolac  (TORADOL ) 15 MG/ML injection 15 mg (15 mg Intravenous Given 01/17/24 1013)  tiZANidine  (ZANAFLEX ) tablet 4 mg (4 mg Oral Given 01/17/24 1013)  doxycycline  (VIBRA -TABS) tablet 100 mg (100 mg Oral Given 01/17/24 1320)    Medical Decision Making:   James Barker is a 73 y.o. male who presents for multiple symptoms as per above.  Physical exam is pertinent for diffuse posterior wheezing.   The differential includes but is not limited to asthma exacerbation, COPD exacerbation, hypercarbic respiratory failure, pulmonary embolism,  pneumonia, viral infection.  Independent historian: None  External data reviewed: Labs: reviewed prior labs for baseline  Initial Plan:  Screening labs including CBC and Metabolic panel to evaluate for infectious or metabolic etiology of disease.  D-dimer to further stratify for PE VBG to evaluate for hypercarbia/acidosis COVID/flu/RSV to evaluate for etiology of patient's cough Chest x-ray to evaluate for structural/infectious intra-thoracic pathology.  EKG and single troponin to evaluate for cardiac pathology. Single troponin appropriate due to greater than 6 hours since maximal intensity of symptoms. Objective evaluation as below reviewed   Labs: Ordered, Independent interpretation, and Details: BMP with creatinine at baseline.  No emergent electrolyte derangement.  CBC without leukocytosis, thrombocytopenia.  Stable anemia on comparison to prior. D-dimer WNL.  Blood gas without acidosis or hypercarbia.  Troponin WNL.  Radiology: Ordered, Independent  interpretation, Details: Chest x-ray without focal airspace opacification, cardiomediastinal silhouette derangement, pneumothorax, pleural effusion, bony derangement, and All images reviewed independently.  Agree with radiology report at this time.   DG Chest 2 View Result Date: 01/17/2024 CLINICAL DATA:  Cough and shortness of breath. EXAM: DG CHEST 2V COMPARISON:  05/29/2023 FINDINGS: The lungs are clear without focal pneumonia, edema, pneumothorax or pleural effusion. Stable chronic atelectasis or scarring in the right mid lung with subtle atelectasis or scarring at the left base, stable. The cardio pericardial silhouette is enlarged. No acute bony abnormality. Telemetry leads overlie the chest. IMPRESSION: No active cardiopulmonary disease. Electronically Signed   By: Camellia Candle M.D.   On: 01/17/2024 12:24    EKG/Medicine tests: Ordered and Independent interpretation EKG Interpretation: Sinus rhythm Confirmed by Rogelia Satterfield ([id number])  on 01/17/2024 8:53:05 AM                Interventions: ASA, DuoNebs X3  See the EMR for full details regarding lab and imaging results.  Given ongoing significant wheezing, will administer 3 back-to-back DuoNebs, Solu-Medrol .  Given patient does report chest pain, edition will administer aspirin .  Patient warrants broad lab and imaging workup as per initial plan above.  Labs obtained, negative COVID, flu, RSV.  Troponin WNL, therefore doubt ACS, given symptoms have been ongoing for greater than 6 hours, patient does not require delta.  D-dimer WNL, doubt PE.  CBC and BMP reassuring.  VBG does not demonstrate hypercarbia or acidosis, therefore doubt severe COPD exacerbation that requires BiPAP or admission.  No evidence of pneumonia on chest x-ray.  Patient's presentation is overall most consistent with COPD exacerbation, likely due to underlying viral illness that is known COVID/flu/RSV.  Was monitored in the emergency department after receiving Solu-Medrol  and 3 DuoNebs, and continued to maintain appropriate work of breathing, had significant improvement in wheezing and subjective shortness of breath.  Patient's chest wall pain that patient experienced while coughing was treated with tizanidine  and Toradol  with improvement.  Overall, given patient had significant improvement in wheezing and feels subjectively improved, feel that patient is appropriate for outpatient management of COPD exacerbation, will prescribe a course of doxycycline  as well as prednisone , patient and wife at bedside comfortable with this plan.  Presentation is most consistent with exacerbation of chronic illness and I did consider and rule out acute life/limb-threatening illness  Discussion of management or test interpretations with external provider(s): Not indicated  Risk Drugs:OTC drugs and Prescription drug management  Disposition: DISCHARGE: I believe that the patient is safe for discharge home with outpatient follow-up.  Patient was informed of all pertinent physical exam, laboratory, and imaging findings. Patient's suspected etiology of their symptom presentation was discussed with the patient and all questions were answered. We discussed following up with PCP. I provided thorough ED return precautions. The patient feels safe and comfortable with this plan.  MDM generated using voice dictation software and may contain dictation errors.  Please contact me for any clarification or with any questions.  Clinical Impression:  1. COPD exacerbation [facility])      Discharge   Final Clinical Impression(s) / ED Diagnoses Final diagnoses:  COPD exacerbation (HCC)    Rx / DC Orders ED Discharge Orders          Ordered    doxycycline  (VIBRAMYCIN ) 100 MG capsule  2 times daily        01/17/24 1311    predniSONE  (DELTASONE ) 10 MG tablet  Daily  01/17/24 1311             Rogelia Jerilynn RAMAN, MD 01/17/24 1607  "

## 2024-01-17 NOTE — ED Notes (Signed)
 Patient transported to X-ray

## 2024-01-20 ENCOUNTER — Other Ambulatory Visit: Payer: Self-pay | Admitting: Internal Medicine

## 2024-01-20 DIAGNOSIS — J4489 Other specified chronic obstructive pulmonary disease: Secondary | ICD-10-CM

## 2024-01-20 MED ORDER — ALBUTEROL SULFATE (2.5 MG/3ML) 0.083% IN NEBU: 2.5000 mg | INHALATION_SOLUTION | Freq: Four times a day (QID) | RESPIRATORY_TRACT | 3 refills | Status: AC | PRN

## 2024-01-20 NOTE — Telephone Encounter (Signed)
 Copied from CRM #[id number]. Topic: Clinical - Medication Refill >> Jan 20, 2024 11:40 AM Russell PARAS wrote: Medication:   albuterol  (PROVENTIL ) (2.5 MG/3ML) 0.083% nebulizer solution   Has the patient contacted their pharmacy? Yes (Agent: If no, request that the patient contact the pharmacy for the refill. If patient does not wish to contact the pharmacy document the reason why and proceed with request.) (Agent: If yes, when and what did the pharmacy advise?)  This is the patient's preferred pharmacy:  [id number] [place], [place] - [address] AT [facility] [address] [place] [address] Phone: [phone] Fax: [phone]  Is this the correct pharmacy for this prescription? Yes If no, delete pharmacy and type the correct one.   Has the prescription been filled recently? No  Is the patient out of the medication? Yes  Has the patient been seen for an appointment in the last year OR does the patient have an upcoming appointment? Yes, 09/08/2023 w/Desai  Can we respond through MyChart? Yes  Agent: Please be advised that Rx refills may take up to 3 business days. We ask that you follow-up with your pharmacy.

## 2024-01-28 ENCOUNTER — Other Ambulatory Visit: Payer: Self-pay

## 2024-01-28 ENCOUNTER — Other Ambulatory Visit: Payer: Self-pay | Admitting: Internal Medicine

## 2024-01-28 DIAGNOSIS — D721 Eosinophilia, unspecified: Secondary | ICD-10-CM

## 2024-01-28 DIAGNOSIS — J4489 Other specified chronic obstructive pulmonary disease: Secondary | ICD-10-CM

## 2024-01-28 NOTE — Telephone Encounter (Signed)
 Pt requesting refill of specialty medication - routing to Rx team to advise.

## 2024-01-29 ENCOUNTER — Other Ambulatory Visit (HOSPITAL_COMMUNITY): Payer: Self-pay

## 2024-01-29 ENCOUNTER — Other Ambulatory Visit: Payer: Self-pay

## 2024-01-29 MED ORDER — DUPIXENT 300 MG/2ML ~~LOC~~ SOAJ
300.0000 mg | SUBCUTANEOUS | 0 refills | Status: AC
  Filled 2024-01-29: qty 12, 84d supply, fill #0
  Filled 2024-01-30: qty 4, 28d supply, fill #0

## 2024-01-29 NOTE — Telephone Encounter (Signed)
 Refill sent for DUPIXENT  to [facility]: [phone]   Dose: 300mg  [place] every 14 days   Last OV: 09/08/23 Provider: previously Dr. Meade  Next OV: overdue, appt in November was cancelled  Routing to scheduling team for follow-up on appt scheduling  Aleck Puls, PharmD, BCPS Clinical Pharmacist  [facility]

## 2024-01-30 ENCOUNTER — Other Ambulatory Visit: Payer: Self-pay | Admitting: Pharmacy Technician

## 2024-01-30 ENCOUNTER — Other Ambulatory Visit: Payer: Self-pay

## 2024-01-30 NOTE — Progress Notes (Signed)
 Specialty Pharmacy Refill Coordination Note  James Barker is a 74 y.o. male contacted today regarding refills of specialty medication(s) Dupilumab  (Dupixent )   Patient requested Delivery   Delivery date: 02/05/24   Verified address: [address] [place] [phone]   Medication will be filled on: 02/04/24

## 2024-01-30 NOTE — Telephone Encounter (Signed)
 Attempted to call patient. Left voicemail for patient to call back to get scheduled with a different provider.

## 2024-02-04 ENCOUNTER — Other Ambulatory Visit: Payer: Self-pay

## 2024-02-24 ENCOUNTER — Other Ambulatory Visit: Payer: Self-pay

## 2024-02-27 ENCOUNTER — Other Ambulatory Visit: Payer: Self-pay
